# Patient Record
Sex: Female | Born: 1955 | Race: White | Hispanic: No | Marital: Married | Smoking: Never smoker
Health system: Southern US, Community
[De-identification: ages and names within clinical notes are randomized; demographics above are authoritative.]

## PROBLEM LIST (undated history)

## (undated) DIAGNOSIS — L02419 Cutaneous abscess of limb, unspecified: Secondary | ICD-10-CM

## (undated) DIAGNOSIS — IMO0002 Reserved for concepts with insufficient information to code with codable children: Secondary | ICD-10-CM

## (undated) DIAGNOSIS — K219 Gastro-esophageal reflux disease without esophagitis: Secondary | ICD-10-CM

## (undated) DIAGNOSIS — L0291 Cutaneous abscess, unspecified: Secondary | ICD-10-CM

## (undated) DIAGNOSIS — Z9889 Other specified postprocedural states: Secondary | ICD-10-CM

## (undated) DIAGNOSIS — Z853 Personal history of malignant neoplasm of breast: Secondary | ICD-10-CM

## (undated) DIAGNOSIS — R011 Cardiac murmur, unspecified: Secondary | ICD-10-CM

## (undated) DIAGNOSIS — E785 Hyperlipidemia, unspecified: Secondary | ICD-10-CM

## (undated) DIAGNOSIS — K2270 Barrett's esophagus without dysplasia: Secondary | ICD-10-CM

## (undated) DIAGNOSIS — M419 Scoliosis, unspecified: Secondary | ICD-10-CM

## (undated) DIAGNOSIS — IMO0001 Reserved for inherently not codable concepts without codable children: Secondary | ICD-10-CM

## (undated) DIAGNOSIS — K589 Irritable bowel syndrome without diarrhea: Secondary | ICD-10-CM

## (undated) DIAGNOSIS — L0390 Cellulitis, unspecified: Secondary | ICD-10-CM

## (undated) DIAGNOSIS — M810 Age-related osteoporosis without current pathological fracture: Secondary | ICD-10-CM

## (undated) DIAGNOSIS — Z780 Asymptomatic menopausal state: Secondary | ICD-10-CM

## (undated) DIAGNOSIS — K222 Esophageal obstruction: Secondary | ICD-10-CM

## (undated) DIAGNOSIS — H269 Unspecified cataract: Secondary | ICD-10-CM

## (undated) DIAGNOSIS — Z923 Personal history of irradiation: Secondary | ICD-10-CM

## (undated) DIAGNOSIS — B1920 Unspecified viral hepatitis C without hepatic coma: Secondary | ICD-10-CM

## (undated) DIAGNOSIS — G039 Meningitis, unspecified: Secondary | ICD-10-CM

## (undated) DIAGNOSIS — L678 Other hair color and hair shaft abnormalities: Secondary | ICD-10-CM

## (undated) DIAGNOSIS — R112 Nausea with vomiting, unspecified: Secondary | ICD-10-CM

## (undated) DIAGNOSIS — I341 Nonrheumatic mitral (valve) prolapse: Secondary | ICD-10-CM

## (undated) DIAGNOSIS — L738 Other specified follicular disorders: Secondary | ICD-10-CM

## (undated) DIAGNOSIS — K449 Diaphragmatic hernia without obstruction or gangrene: Secondary | ICD-10-CM

## (undated) DIAGNOSIS — G9050 Complex regional pain syndrome I, unspecified: Secondary | ICD-10-CM

## (undated) DIAGNOSIS — L719 Rosacea, unspecified: Secondary | ICD-10-CM

## (undated) DIAGNOSIS — Z5189 Encounter for other specified aftercare: Secondary | ICD-10-CM

## (undated) DIAGNOSIS — M1990 Unspecified osteoarthritis, unspecified site: Secondary | ICD-10-CM

## (undated) DIAGNOSIS — K5790 Diverticulosis of intestine, part unspecified, without perforation or abscess without bleeding: Secondary | ICD-10-CM

## (undated) DIAGNOSIS — K648 Other hemorrhoids: Secondary | ICD-10-CM

## (undated) DIAGNOSIS — G629 Polyneuropathy, unspecified: Secondary | ICD-10-CM

## (undated) DIAGNOSIS — C50412 Malignant neoplasm of upper-outer quadrant of left female breast: Secondary | ICD-10-CM

## (undated) HISTORY — DX: Diaphragmatic hernia without obstruction or gangrene: K44.9

## (undated) HISTORY — PX: BACK SURGERY: SHX140

## (undated) HISTORY — PX: UPPER GASTROINTESTINAL ENDOSCOPY: SHX188

## (undated) HISTORY — DX: Unspecified osteoarthritis, unspecified site: M19.90

## (undated) HISTORY — DX: Esophageal obstruction: K22.2

## (undated) HISTORY — DX: Asymptomatic menopausal state: Z78.0

## (undated) HISTORY — DX: Cardiac murmur, unspecified: R01.1

## (undated) HISTORY — PX: INCISE AND DRAIN ABCESS: PRO64

## (undated) HISTORY — PX: BREAST FIBROADENOMA SURGERY: SHX580

## (undated) HISTORY — DX: Hyperlipidemia, unspecified: E78.5

## (undated) HISTORY — DX: Nonrheumatic mitral (valve) prolapse: I34.1

## (undated) HISTORY — DX: Gastro-esophageal reflux disease without esophagitis: K21.9

## (undated) HISTORY — DX: Encounter for other specified aftercare: Z51.89

## (undated) HISTORY — DX: Diverticulosis of intestine, part unspecified, without perforation or abscess without bleeding: K57.90

## (undated) HISTORY — PX: SPINAL FUSION: SHX223

## (undated) HISTORY — DX: Irritable bowel syndrome, unspecified: K58.9

## (undated) HISTORY — PX: OTHER SURGICAL HISTORY: SHX169

## (undated) HISTORY — DX: Rosacea, unspecified: L71.9

## (undated) HISTORY — DX: Barrett's esophagus without dysplasia: K22.70

## (undated) HISTORY — DX: Unspecified cataract: H26.9

## (undated) HISTORY — DX: Scoliosis, unspecified: M41.9

## (undated) HISTORY — DX: Cutaneous abscess of limb, unspecified: L02.419

## (undated) HISTORY — DX: Other specified follicular disorders: L73.8

## (undated) HISTORY — DX: Age-related osteoporosis without current pathological fracture: M81.0

## (undated) HISTORY — PX: BREAST LUMPECTOMY: SHX2

## (undated) HISTORY — DX: Personal history of malignant neoplasm of breast: Z85.3

## (undated) HISTORY — DX: Complex regional pain syndrome I, unspecified: G90.50

## (undated) HISTORY — PX: HYSTEROSCOPY: SHX211

## (undated) HISTORY — DX: Unspecified viral hepatitis C without hepatic coma: B19.20

## (undated) HISTORY — DX: Malignant neoplasm of upper-outer quadrant of left female breast: C50.412

## (undated) HISTORY — DX: Other hemorrhoids: K64.8

## (undated) HISTORY — DX: Cutaneous abscess, unspecified: L02.91

## (undated) HISTORY — DX: Cellulitis, unspecified: L03.90

## (undated) HISTORY — PX: TONSILLECTOMY: SUR1361

## (undated) HISTORY — DX: Polyneuropathy, unspecified: G62.9

## (undated) HISTORY — DX: Other hair color and hair shaft abnormalities: L67.8

---

## 1988-07-29 DIAGNOSIS — B1920 Unspecified viral hepatitis C without hepatic coma: Secondary | ICD-10-CM

## 1988-07-29 HISTORY — DX: Unspecified viral hepatitis C without hepatic coma: B19.20

## 1998-07-17 ENCOUNTER — Other Ambulatory Visit: Admission: RE | Admit: 1998-07-17 | Discharge: 1998-07-17 | Payer: Self-pay | Admitting: Obstetrics and Gynecology

## 1998-10-05 ENCOUNTER — Encounter: Payer: Self-pay | Admitting: Internal Medicine

## 1998-10-05 ENCOUNTER — Ambulatory Visit (HOSPITAL_COMMUNITY): Admission: RE | Admit: 1998-10-05 | Discharge: 1998-10-05 | Payer: Self-pay | Admitting: Internal Medicine

## 1999-10-22 ENCOUNTER — Other Ambulatory Visit: Admission: RE | Admit: 1999-10-22 | Discharge: 1999-10-22 | Payer: Self-pay | Admitting: Obstetrics and Gynecology

## 2000-11-27 ENCOUNTER — Other Ambulatory Visit: Admission: RE | Admit: 2000-11-27 | Discharge: 2000-11-27 | Payer: Self-pay | Admitting: Obstetrics and Gynecology

## 2000-12-02 ENCOUNTER — Encounter: Payer: Self-pay | Admitting: Obstetrics and Gynecology

## 2000-12-02 ENCOUNTER — Encounter: Admission: RE | Admit: 2000-12-02 | Discharge: 2000-12-02 | Payer: Self-pay | Admitting: Obstetrics and Gynecology

## 2001-09-03 ENCOUNTER — Encounter: Payer: Self-pay | Admitting: Internal Medicine

## 2001-12-15 ENCOUNTER — Other Ambulatory Visit: Admission: RE | Admit: 2001-12-15 | Discharge: 2001-12-15 | Payer: Self-pay | Admitting: Obstetrics and Gynecology

## 2001-12-18 ENCOUNTER — Encounter: Admission: RE | Admit: 2001-12-18 | Discharge: 2001-12-18 | Payer: Self-pay | Admitting: Obstetrics and Gynecology

## 2001-12-18 ENCOUNTER — Encounter: Payer: Self-pay | Admitting: Obstetrics and Gynecology

## 2002-01-13 ENCOUNTER — Ambulatory Visit (HOSPITAL_COMMUNITY): Admission: RE | Admit: 2002-01-13 | Discharge: 2002-01-13 | Payer: Self-pay | Admitting: Psychiatry

## 2002-01-13 ENCOUNTER — Encounter: Payer: Self-pay | Admitting: Neurology

## 2002-09-02 ENCOUNTER — Ambulatory Visit (HOSPITAL_COMMUNITY): Admission: RE | Admit: 2002-09-02 | Discharge: 2002-09-02 | Payer: Self-pay | Admitting: Internal Medicine

## 2002-09-02 ENCOUNTER — Encounter: Payer: Self-pay | Admitting: Internal Medicine

## 2002-12-08 ENCOUNTER — Ambulatory Visit (HOSPITAL_COMMUNITY): Admission: RE | Admit: 2002-12-08 | Discharge: 2002-12-08 | Payer: Self-pay | Admitting: Obstetrics and Gynecology

## 2002-12-08 ENCOUNTER — Encounter (INDEPENDENT_AMBULATORY_CARE_PROVIDER_SITE_OTHER): Payer: Self-pay

## 2002-12-20 ENCOUNTER — Encounter: Admission: RE | Admit: 2002-12-20 | Discharge: 2002-12-20 | Payer: Self-pay | Admitting: Obstetrics and Gynecology

## 2002-12-20 ENCOUNTER — Other Ambulatory Visit: Admission: RE | Admit: 2002-12-20 | Discharge: 2002-12-20 | Payer: Self-pay | Admitting: Obstetrics and Gynecology

## 2002-12-20 ENCOUNTER — Encounter: Payer: Self-pay | Admitting: Obstetrics and Gynecology

## 2003-04-05 ENCOUNTER — Ambulatory Visit (HOSPITAL_COMMUNITY): Admission: RE | Admit: 2003-04-05 | Discharge: 2003-04-05 | Payer: Self-pay | Admitting: Internal Medicine

## 2003-04-05 ENCOUNTER — Encounter: Payer: Self-pay | Admitting: Internal Medicine

## 2003-10-06 ENCOUNTER — Ambulatory Visit (HOSPITAL_COMMUNITY): Admission: RE | Admit: 2003-10-06 | Discharge: 2003-10-06 | Payer: Self-pay | Admitting: Internal Medicine

## 2003-10-06 ENCOUNTER — Encounter: Payer: Self-pay | Admitting: Internal Medicine

## 2003-12-27 ENCOUNTER — Other Ambulatory Visit: Admission: RE | Admit: 2003-12-27 | Discharge: 2003-12-27 | Payer: Self-pay | Admitting: Obstetrics and Gynecology

## 2003-12-27 ENCOUNTER — Encounter: Admission: RE | Admit: 2003-12-27 | Discharge: 2003-12-27 | Payer: Self-pay | Admitting: Obstetrics and Gynecology

## 2005-01-31 ENCOUNTER — Ambulatory Visit: Payer: Self-pay | Admitting: Internal Medicine

## 2005-07-18 ENCOUNTER — Ambulatory Visit (HOSPITAL_COMMUNITY): Admission: RE | Admit: 2005-07-18 | Discharge: 2005-07-18 | Payer: Self-pay | Admitting: Internal Medicine

## 2005-09-23 ENCOUNTER — Encounter: Admission: RE | Admit: 2005-09-23 | Discharge: 2005-09-23 | Payer: Self-pay | Admitting: Obstetrics and Gynecology

## 2005-11-12 ENCOUNTER — Ambulatory Visit: Payer: Self-pay | Admitting: Internal Medicine

## 2006-05-05 ENCOUNTER — Ambulatory Visit: Payer: Self-pay | Admitting: Internal Medicine

## 2006-05-06 ENCOUNTER — Ambulatory Visit: Payer: Self-pay | Admitting: Internal Medicine

## 2006-05-06 ENCOUNTER — Encounter (INDEPENDENT_AMBULATORY_CARE_PROVIDER_SITE_OTHER): Payer: Self-pay | Admitting: *Deleted

## 2006-06-18 ENCOUNTER — Ambulatory Visit (HOSPITAL_COMMUNITY): Admission: RE | Admit: 2006-06-18 | Discharge: 2006-06-18 | Payer: Self-pay | Admitting: Cardiovascular Disease

## 2006-11-26 ENCOUNTER — Encounter
Admission: RE | Admit: 2006-11-26 | Discharge: 2006-11-26 | Payer: Self-pay | Admitting: Physical Medicine and Rehabilitation

## 2007-02-03 ENCOUNTER — Ambulatory Visit: Payer: Self-pay | Admitting: Cardiovascular Disease

## 2007-02-04 ENCOUNTER — Ambulatory Visit: Payer: Self-pay | Admitting: Cardiology

## 2007-02-04 ENCOUNTER — Ambulatory Visit (HOSPITAL_COMMUNITY): Admission: RE | Admit: 2007-02-04 | Discharge: 2007-02-04 | Payer: Self-pay | Admitting: Cardiovascular Disease

## 2007-04-20 ENCOUNTER — Ambulatory Visit: Payer: Self-pay | Admitting: Internal Medicine

## 2007-05-04 ENCOUNTER — Ambulatory Visit: Payer: Self-pay | Admitting: Internal Medicine

## 2007-05-04 ENCOUNTER — Encounter: Payer: Self-pay | Admitting: Internal Medicine

## 2007-06-22 ENCOUNTER — Ambulatory Visit (HOSPITAL_COMMUNITY): Admission: RE | Admit: 2007-06-22 | Discharge: 2007-06-22 | Payer: Self-pay | Admitting: Obstetrics and Gynecology

## 2007-07-30 HISTORY — PX: COLONOSCOPY: SHX174

## 2007-12-14 ENCOUNTER — Telehealth: Payer: Self-pay | Admitting: Internal Medicine

## 2008-01-26 DIAGNOSIS — G589 Mononeuropathy, unspecified: Secondary | ICD-10-CM | POA: Insufficient documentation

## 2008-01-26 DIAGNOSIS — M4120 Other idiopathic scoliosis, site unspecified: Secondary | ICD-10-CM | POA: Insufficient documentation

## 2008-01-26 DIAGNOSIS — K222 Esophageal obstruction: Secondary | ICD-10-CM

## 2008-01-26 DIAGNOSIS — K648 Other hemorrhoids: Secondary | ICD-10-CM | POA: Insufficient documentation

## 2008-01-26 DIAGNOSIS — K589 Irritable bowel syndrome without diarrhea: Secondary | ICD-10-CM

## 2008-01-26 DIAGNOSIS — K219 Gastro-esophageal reflux disease without esophagitis: Secondary | ICD-10-CM

## 2008-01-26 DIAGNOSIS — Z8619 Personal history of other infectious and parasitic diseases: Secondary | ICD-10-CM

## 2008-01-26 DIAGNOSIS — Z8679 Personal history of other diseases of the circulatory system: Secondary | ICD-10-CM | POA: Insufficient documentation

## 2008-01-26 DIAGNOSIS — E785 Hyperlipidemia, unspecified: Secondary | ICD-10-CM | POA: Insufficient documentation

## 2008-01-26 DIAGNOSIS — G9050 Complex regional pain syndrome I, unspecified: Secondary | ICD-10-CM | POA: Insufficient documentation

## 2008-01-26 DIAGNOSIS — K5730 Diverticulosis of large intestine without perforation or abscess without bleeding: Secondary | ICD-10-CM | POA: Insufficient documentation

## 2008-01-26 DIAGNOSIS — K2270 Barrett's esophagus without dysplasia: Secondary | ICD-10-CM

## 2008-01-27 ENCOUNTER — Ambulatory Visit: Payer: Self-pay | Admitting: Internal Medicine

## 2008-01-28 ENCOUNTER — Ambulatory Visit: Payer: Self-pay | Admitting: Internal Medicine

## 2008-02-01 LAB — CONVERTED CEMR LAB
ALT: 25 units/L (ref 0–35)
AST: 22 units/L (ref 0–37)
Alkaline Phosphatase: 57 units/L (ref 39–117)
Basophils Absolute: 0.1 10*3/uL (ref 0.0–0.1)
Bilirubin, Direct: 0.1 mg/dL (ref 0.0–0.3)
Iron: 69 ug/dL (ref 42–145)
Lymphocytes Relative: 29.7 % (ref 12.0–46.0)
MCHC: 33.6 g/dL (ref 30.0–36.0)
Monocytes Relative: 7.4 % (ref 3.0–12.0)
Neutrophils Relative %: 58.7 % (ref 43.0–77.0)
Platelets: 214 10*3/uL (ref 150–400)
RDW: 12.6 % (ref 11.5–14.6)
Total Bilirubin: 0.7 mg/dL (ref 0.3–1.2)

## 2008-02-03 LAB — CONVERTED CEMR LAB

## 2008-02-09 ENCOUNTER — Telehealth: Payer: Self-pay | Admitting: Internal Medicine

## 2009-02-06 ENCOUNTER — Telehealth: Payer: Self-pay | Admitting: Internal Medicine

## 2009-02-07 ENCOUNTER — Ambulatory Visit: Payer: Self-pay | Admitting: Internal Medicine

## 2009-02-07 LAB — CONVERTED CEMR LAB

## 2009-02-08 ENCOUNTER — Ambulatory Visit (HOSPITAL_COMMUNITY): Admission: RE | Admit: 2009-02-08 | Discharge: 2009-02-08 | Payer: Self-pay | Admitting: Obstetrics and Gynecology

## 2009-02-08 LAB — CONVERTED CEMR LAB
AST: 21 units/L (ref 0–37)
Alkaline Phosphatase: 67 units/L (ref 39–117)
Basophils Relative: 0.8 % (ref 0.0–3.0)
Bilirubin, Direct: 0.1 mg/dL (ref 0.0–0.3)
CO2: 34 meq/L — ABNORMAL HIGH (ref 19–32)
Calcium: 9.9 mg/dL (ref 8.4–10.5)
Chloride: 102 meq/L (ref 96–112)
Eosinophils Absolute: 0.2 10*3/uL (ref 0.0–0.7)
Eosinophils Relative: 3.1 % (ref 0.0–5.0)
Glucose, Bld: 92 mg/dL (ref 70–99)
HCT: 43 % (ref 36.0–46.0)
Lymphs Abs: 1.3 10*3/uL (ref 0.7–4.0)
MCHC: 34.8 g/dL (ref 30.0–36.0)
MCV: 86.2 fL (ref 78.0–100.0)
Monocytes Absolute: 0.3 10*3/uL (ref 0.1–1.0)
Platelets: 226 10*3/uL (ref 150.0–400.0)
Potassium: 4.8 meq/L (ref 3.5–5.1)
Sodium: 142 meq/L (ref 135–145)
Total Protein: 7.2 g/dL (ref 6.0–8.3)
WBC: 5.6 10*3/uL (ref 4.5–10.5)

## 2009-02-09 ENCOUNTER — Encounter: Payer: Self-pay | Admitting: Internal Medicine

## 2009-02-09 ENCOUNTER — Telehealth: Payer: Self-pay | Admitting: Internal Medicine

## 2009-04-10 ENCOUNTER — Encounter (INDEPENDENT_AMBULATORY_CARE_PROVIDER_SITE_OTHER): Payer: Self-pay | Admitting: *Deleted

## 2009-05-08 ENCOUNTER — Ambulatory Visit: Payer: Self-pay | Admitting: Internal Medicine

## 2009-05-17 ENCOUNTER — Ambulatory Visit: Payer: Self-pay | Admitting: Internal Medicine

## 2009-05-17 ENCOUNTER — Encounter: Payer: Self-pay | Admitting: Internal Medicine

## 2009-05-17 LAB — CONVERTED CEMR LAB
Basophils Absolute: 0 10*3/uL (ref 0.0–0.1)
Eosinophils Absolute: 0.2 10*3/uL (ref 0.0–0.7)
HCT: 42.8 % (ref 36.0–46.0)
Hemoglobin: 14.5 g/dL (ref 12.0–15.0)
Lymphs Abs: 1.4 10*3/uL (ref 0.7–4.0)
MCHC: 33.8 g/dL (ref 30.0–36.0)
Monocytes Absolute: 0.3 10*3/uL (ref 0.1–1.0)
Neutro Abs: 3.5 10*3/uL (ref 1.4–7.7)
RDW: 13 % (ref 11.5–14.6)

## 2009-05-26 ENCOUNTER — Encounter: Payer: Self-pay | Admitting: Internal Medicine

## 2009-12-07 ENCOUNTER — Ambulatory Visit (HOSPITAL_COMMUNITY): Admission: RE | Admit: 2009-12-07 | Discharge: 2009-12-07 | Payer: Self-pay | Admitting: Urology

## 2010-03-01 ENCOUNTER — Ambulatory Visit (HOSPITAL_COMMUNITY): Admission: RE | Admit: 2010-03-01 | Discharge: 2010-03-01 | Payer: Self-pay | Admitting: Obstetrics and Gynecology

## 2010-07-11 ENCOUNTER — Telehealth: Payer: Self-pay | Admitting: Internal Medicine

## 2010-07-13 ENCOUNTER — Ambulatory Visit: Payer: Self-pay | Admitting: Internal Medicine

## 2010-07-16 ENCOUNTER — Encounter: Payer: Self-pay | Admitting: Internal Medicine

## 2010-07-18 LAB — CONVERTED CEMR LAB
Alkaline Phosphatase: 71 units/L (ref 39–117)
Basophils Absolute: 0.1 10*3/uL (ref 0.0–0.1)
Eosinophils Relative: 1.5 % (ref 0.0–5.0)
Glucose, Bld: 78 mg/dL (ref 70–99)
HCT: 43 % (ref 36.0–46.0)
HDL: 58.6 mg/dL (ref >39.00)
Lymphs Abs: 1.5 10*3/uL (ref 0.7–4.0)
MCHC: 33.9 g/dL (ref 30.0–36.0)
MCV: 88.9 fL (ref 78.0–100.0)
Monocytes Absolute: 0.5 10*3/uL (ref 0.1–1.0)
Platelets: 249 10*3/uL (ref 150.0–400.0)
RDW: 13.7 % (ref 11.5–14.6)
Sodium: 142 meq/L (ref 135–145)
TSH: 1.43 microintl units/mL (ref 0.35–5.50)
Total Bilirubin: 0.6 mg/dL (ref 0.3–1.2)
Total Protein: 7 g/dL (ref 6.0–8.3)
VLDL: 13.6 mg/dL (ref 0.0–40.0)
Vitamin B-12: 430 pg/mL (ref 211–911)

## 2010-07-24 LAB — CONVERTED CEMR LAB

## 2010-08-18 ENCOUNTER — Encounter: Payer: Self-pay | Admitting: Otolaryngology

## 2010-08-19 ENCOUNTER — Encounter: Payer: Self-pay | Admitting: Obstetrics and Gynecology

## 2010-08-30 NOTE — Progress Notes (Signed)
Summary: labs  Phone Note Call from Patient Call back at Home Phone [phone] Call back at [phone]   Caller: Patient Call For: Dr. Juanda Chance Reason for Call: Talk to Nurse Summary of Call: would like to sch labs Initial call taken by: Vallarie Mare,  July 11, 2010 1:41 PM  Follow-up for Phone Call        Message left for patient to call back.Jesse Fall RN  July 11, 2010 2:24 PM Message left for patient to call back.Jesse Fall RN  July 12, 2010 10:39 AM Message left for patient to call back.Jesse Fall RN  July 13, 2010 9:31 AM Patient returned our call. She states she usually has yearly labs to check her liver and she has not had labs this year. She takes Neurontin, Omprazole and Ibuprofen. She would like to have the labs before the end of the year if she needs them. May call patient with the answer at her husbands cell [phone]. Please, advise. Follow-up by: Jesse Fall RN,  July 13, 2010 9:43 AM  Additional Follow-up for Phone Call Additional follow up Details #1::        CBC,C-met, TSH, Hep C RNA by PCR,, lipid panel,B12,Fe,TIBC Additional Follow-up by: Hart Carwin MD,  July 13, 2010 12:47 PM     Appended Document: labs Labs added in IDX. Patient states she will come next week for labs.

## 2010-12-11 NOTE — Procedures (Signed)
NAMEALIVIYAH, MALANGA             ACCOUNT NO.:  [id number]   MEDICAL RECORD NO.:  [id number]          PATIENT TYPE:  OUT   LOCATION:  RAD                           FACILITY:  [facility]   PHYSICIAN:  Gerrit Friends. Dietrich Pates, MD, FACCDATE OF BIRTH:  1956-06-04   DATE OF PROCEDURE:  02/04/2007  DATE OF DISCHARGE:                                ECHOCARDIOGRAM   REFERRING:  Kingsley Callander. Ouida Sills, MD   CLINICAL DATA:  A 55 year old woman with a history of MVP.   M-mode aorta 2.6, left atrium 3.4, septum 0.8, posterior wall 0.8, LV  diastole 4.6, LV systole 3.5, RV diastole 3.9.   1. Technically adequate echocardiographic study.  2. Normal left atrial size.  3. Mild right atrial and right ventricular enlargement.  4. Normal proximal ascending aorta.  5. Normal aortic, mitral, tricuspid and pulmonic valves; physiologic      tricuspid regurgitation; normal estimated RV systolic pressure.  6. Normal proximal pulmonary artery.  7. Normal internal dimension, wall thickness, regional and global      function of the left ventricle.  8. Normal Doppler examination.  9. Normal IVC.      Gerrit Friends. Dietrich Pates, MD, [facility]  Electronically Signed     RMR/MEDQ  D:  02/04/2007  T:  02/05/2007  Job:  [id number]

## 2010-12-11 NOTE — Assessment & Plan Note (Signed)
[facility]                        CARDIOLOGY OFFICE NOTE   Terrell, Amanda                    MRN:          [id number]  DATE:02/03/2007                            DOB:          12-11-1955    [name] is a pleasant 55 year old patient, referred by Dr. Ouida Sills for  followup of mitral valve disease.  [name] brought with her multiple  x-rays and approximately 100 pages of old records from [facility] and [facility] as  well as pain clinics and previous records from [place].   After looking through all these, unfortunately they mostly have to do  with her debilitating scoliosis and previous spine surgeries.   The patient has had a horrendous time with her scoliosis.  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  After having read through all  of this however I asked Nayda why she needed to see a heart doctor.   Apparently there is a distant history of mitral valve prolapse.  She has  seen Dr. Daleen Squibb in 2001 I believe.  At that time she had a 2D  echocardiogram which showed mild prolapse with trivial MR.  The patient  has had benign palpitations in the past.  In regards to her palpitations  they are infrequent.  They tend to be skips and not rapid heartbeats.  There is no associated chest pain, presyncope, or shortness of breath.   The palpitations really have not been bad over the last year, they were  much worse back in 2001.   She has never had a Holter monitor.  She did have a stress test at that  time which was normal.   REVIEW OF SYSTEMS:  Otherwise negative.   PAST MEDICAL HISTORY:  Remarkable for her numerous spinal surgeries and  reflex sympathetic dystrophy.  She is a chronic pain clinic in [facility].   There is  also a question of hepatitis C that she follows up with Dr.  Lina Sar for.  She says this has been fine.   Her cardiac risk factors primarily include question of  hypercholesterolemia, no recent cholesterol check and not on  medications.  She is a nonsmoker.  No family history for coronary  disease.  No diabetes, no hypertension.   Her only medications include:  1. Lyrica 150 t.i.d.  2. Motrin 200 t.i.d.   SHE HAS NO KNOWN ALLERGIES, ALTHOUGH SHE IS INTOLERANT TO CLONIDINE.  APPARENTLY THIS WAS TRIED TO HELP WITH HER PAIN IN HER LEG BUT IT CAUSED  HER EXCESSIVE LOW BLOOD PRESSURE.   Has indicated family history is noncontributory.   She is happily married, she has 3 grown children.  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   She has joined a Y, but has difficulty ambulating because of a bit of a  foot drop in the right ankle.   She does not drink or smoke.   EXAMINATION:  Remarkable for an actual healthy-appearing, middle-aged  white female in no distress.  She is afebrile, weight is 157, blood  pressure is 118/88, pulse 80 and regular, respiratory rate is 14.  HEENT:  Normal, there is no carotid bruits, no lymphadenopathy, no  thyromegaly.  LUNGS:  Clear with reasonable diaphragmatic motion.  She has significant scoliosis with previous lumbar surgery.  There is an  S1-S2 with normal heart sounds, I do not hear a click or an MR murmur.  PMI is normal.  ABDOMEN:  Benign, bowel sounds positive, no organomegaly and no  tenderness, no hepatosplenomegaly, hepatojugular reflux.  Distal pulses are intact.  There is trace edema in the right lower  extremity.  She has mildly decreased abduction at the ankle, indicating  some motor nerve problems there.  She has some sensory deficits in the  soles of both feet.  NEUROLOGICAL:  Exam is otherwise nonfocal.  There is no obvious muscular  weakness.   Her baseline EKG is normal.   IMPRESSION:  1.  Benign palpitations, I do not think there is any need for further      workup.  There is no need for monitoring.  She will continue pain      management as indicated.  I would not give her a beta blocker given      her blood pressure response to Clonidine and tendency to run low.  2. History of mitral valve prolapse, patient is concerned about this.      I do not hear a significant murmur.  I think it is reasonable to do      a followup echo to rule out any significant mitral valve disease.      She does not need subacute bacterial endocarditis prophylaxis.  3. History of lumbar scoliosis with reflex sympathetic dystrophy,      followup with her primary pain clinic in [place] and continue      Lyrica and Motrin.   Patient will be seen on a p.r.n. basis, so long as her echo is not bad.     Noralyn Pick. Eden Emms, MD, [facility]  Electronically Signed    PCN/MedQ  DD: 02/03/2007  DT: 02/03/2007  Job #: [id number]

## 2010-12-14 NOTE — Assessment & Plan Note (Signed)
[facility]                           GASTROENTEROLOGY OFFICE NOTE   JACOYA, BAUMAN                    MRN:          [id number]  DATE:05/05/2006                            DOB:          1955/10/17   Ms. Gully is a 56 year old white female with a history of hepatitis C,  status post alfa-1 interferon treatment, finished in 2001, severe scoliosis,  status post a repair in the 1980s, history of herpes zoster in 2001, liver  biopsy in March 2002 showing hepatitis activity level 2 fibrosis 1.  Last  colonoscopy in February of 2003 for neoplastic screening showed  diverticulosis and internal hemorrhoids.  She had one episode of rectal  bleeding.  She is currently taking up to 8 Motrin a day for chronic low back  pain and right foot drop, together with Neurontin 300 mg four times a day.  She has experienced epigastric discomfort and some nausea.  Last ultrasound  of the upper abdomen early this year showed normal gallbladder and common  bile duct.  Both of her parents had their gallbladder removed.   PHYSICAL EXAMINATION:  Blood pressure 118/76, pulse 72 and weight 148  pounds.  She was alert, oriented, in no distress.  LUNGS:  Clear to auscultation.  COR:  Normal S1, normal S2.  ABDOMEN:  Soft with well-healed surgical scars, normoactive bowel sounds,  tenderness above the umbilicus and in the midline.  Liver edge at the costal  margin.  RECTAL:  Exam with Hemoccult-negative stool.  EXTREMITIES:  No edema.  Right foot was cold with decreased pulses.   IMPRESSION:  30. 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  2. Symptomatic hemorrhoids, currently under good control with Proctocort.  3. Irritable bowel syndrome by history.  4. Hepatitis C, last viral load early this year was negative by PCR.   PLAN:  1. Protonix 40 mg p.o. daily.  2. Refill for Ultram.  3. Decrease Motrin as much  as possible.  4. Upper endoscopy scheduled for May 06, 2006.      Hedwig Morton. Juanda Chance, MD   DMB/MedQ  DD:  05/05/2006  DT:  05/07/2006  Job #:  [id number]   cc:   Kingsley Callander. Ouida Sills, MD

## 2010-12-14 NOTE — Op Note (Signed)
NAME:  Amanda Terrell, Amanda Terrell                       ACCOUNT NO.:  [id number]   MEDICAL RECORD NO.:  [id number]                   PATIENT TYPE:  AMB   LOCATION:  SDC                                  FACILITY:  [facility]   PHYSICIAN:  Sherry A. Rosalio Macadamia, M.D.           DATE OF BIRTH:  August 14, 1955   DATE OF PROCEDURE:  12/08/2002  DATE OF DISCHARGE:                                 OPERATIVE REPORT   PREOPERATIVE DIAGNOSIS:  Menorrhagia, submucosal fibroid.   POSTOPERATIVE DIAGNOSIS:  Menorrhagia, submucosal fibroid.   PROCEDURE:  D & C, hysteroscopy with resectoscope.   SURGEON:  Sherry A. Rosalio Macadamia, M.D.   ANESTHESIA:  MAC.   INDICATIONS:  This is a 55 year old G4, P3, 0-1-3, woman, who has had  menstrual periods every month, lasting 7-8 days with excessively heavy flow.  The patient's flow has increased recently, despite being on birth control  pills.  Ultrasound was performed, which revealed a fibroid uterus with  probable submucosal fibroid on the anterior wall.  Because of this, the  patient was brought to the operating room for D & C, hysteroscopy, and  resectoscope.   FINDINGS:  10-week size, anteflexed uterus, irregular, no adnexal mass,  irregular tissue in the endometrial cavity, and indentation of the  endometrial cavity from an anterior submucosal fibroid.   PROCEDURE:  The patient was brought into the operating room.  She was placed  in the dorsolithotomy position.  Adequate IV sedation was administered.  She  was washed with Hibiclens.  The patient was draped in a sterile fashion.  Pelvic examination was performed.  A speculum was placed in the vagina.  The  vagina was washed with Hibiclens. Paracervical block was administered with  1% ______.  The anterior lip of the cervix was grasped with a single-tooth  tenaculum.  The cervix was sounded.  The cervix was dilated with Pratt  dilators to a #31.  The hysteroscope was introduced into the endometrial  cavity.  Pictures were  obtained.  Using a double-loop right angle resector,  endometrial tissue was sampled circumferentially.  The anterior wall of the  endometrial cavity was sampled.  A submucosal fibroid was present.  This  fibroid was not completely within the endometrial cavity.  The bleeders were  cauterized, and endometrial tissue that remained was also cauterized.  Adequate hemostasis was present.  All instruments were removed from the  vagina.  The patient was taken out of the dorsolithotomy position.  She was  awakened.  She was moved from the operating table to the stretcher in stable  condition.   COMPLICATIONS:  None.   ESTIMATED BLOOD LOSS:  Less than 5 mL.   Sorbitol differential was a -70 mL.  Sherry A. Rosalio Macadamia, M.D.   SAD/MEDQ  D:  12/08/2002  T:  12/09/2002  Job:  [id number]

## 2011-05-28 ENCOUNTER — Telehealth: Payer: Self-pay | Admitting: Internal Medicine

## 2011-05-28 ENCOUNTER — Other Ambulatory Visit (HOSPITAL_COMMUNITY): Payer: Self-pay | Admitting: Internal Medicine

## 2011-05-28 DIAGNOSIS — Z139 Encounter for screening, unspecified: Secondary | ICD-10-CM

## 2011-05-28 DIAGNOSIS — B1920 Unspecified viral hepatitis C without hepatic coma: Secondary | ICD-10-CM

## 2011-05-28 NOTE — Telephone Encounter (Signed)
Patient calling to verify her recall for NFA(2130) and to see when she needs to come in for her yearly labs for "liver". Last labs on 07/16/10 - CBC, CMET. TSH, Hep C RNA by PCP , Lipid Panel, B12, Fe, TIBC)  Please , advise.

## 2011-05-28 NOTE — Telephone Encounter (Signed)
Spoke with patient and she will get labs the week of 07/01/11.

## 2011-05-28 NOTE — Telephone Encounter (Signed)
Yes, please repeat the same labs in Dec 2012, EGD in 2013, she did not have Barrett's on last EGD

## 2011-05-30 ENCOUNTER — Other Ambulatory Visit (HOSPITAL_COMMUNITY): Payer: Self-pay | Admitting: Obstetrics & Gynecology

## 2011-05-30 ENCOUNTER — Ambulatory Visit (HOSPITAL_COMMUNITY)
Admission: RE | Admit: 2011-05-30 | Discharge: 2011-05-30 | Disposition: A | Discharge status: Home / Self Care | Payer: BC Managed Care – PPO | Source: Ambulatory Visit | Attending: Internal Medicine | Admitting: Internal Medicine

## 2011-05-30 DIAGNOSIS — Z1231 Encounter for screening mammogram for malignant neoplasm of breast: Secondary | ICD-10-CM | POA: Insufficient documentation

## 2011-05-30 DIAGNOSIS — Z139 Encounter for screening, unspecified: Secondary | ICD-10-CM

## 2011-06-27 ENCOUNTER — Encounter (INDEPENDENT_AMBULATORY_CARE_PROVIDER_SITE_OTHER): Payer: Self-pay | Admitting: Surgery

## 2011-06-27 ENCOUNTER — Ambulatory Visit (INDEPENDENT_AMBULATORY_CARE_PROVIDER_SITE_OTHER): Payer: BC Managed Care – PPO | Admitting: Surgery

## 2011-06-27 VITALS — BP 128/80 | HR 70 | Temp 96.9°F | Resp 16 | Ht 64.0 in | Wt 148.0 lb

## 2011-06-27 DIAGNOSIS — IMO0002 Reserved for concepts with insufficient information to code with codable children: Secondary | ICD-10-CM

## 2011-06-27 DIAGNOSIS — L02412 Cutaneous abscess of left axilla: Secondary | ICD-10-CM | POA: Insufficient documentation

## 2011-06-27 NOTE — Progress Notes (Signed)
Subjective:     Patient ID: Amanda Terrell, female   DOB: [birth year], 55 y.o.   MRN: [id number]  HPI This patient is referred by Dr. Terri Piedra for evaluation of a left axillary abscess. She has had some incision and drainage performed on this recently. She has also been on Bactrim as the abscess was MRSA positive. She is here for further evaluation. She reports that it started as a hard knot underneath the skin which he mashed. She is otherwise healthy with no complaints.  Review of Systems     Objective:   Physical Exam On examination, she has 2 separate areas of fluctuance in the left axilla. After obtaining consent, I anesthetized these areas with lidocaine. I performed 2 separate incisions and entered a large abscess cavity. She had [place] of drainage, this today and there is still some fluid there. There was a large amount of induration as well. I then packed both wounds separately with quarter-inch gauze.    Assessment:     Patient with left axillary abscess.    Plan:     Wound care instructions were given. She will pack the wound daily with gauze and wash with soap and water. I reviewed the Bactrim and broke her for hydrocodone as well. A timeout next week I will have her come back and see someone in the urgent office.

## 2011-07-02 ENCOUNTER — Ambulatory Visit (INDEPENDENT_AMBULATORY_CARE_PROVIDER_SITE_OTHER): Payer: BC Managed Care – PPO | Admitting: General Surgery

## 2011-07-02 ENCOUNTER — Encounter (INDEPENDENT_AMBULATORY_CARE_PROVIDER_SITE_OTHER): Payer: Self-pay | Admitting: General Surgery

## 2011-07-02 VITALS — BP 122/76 | HR 68 | Temp 97.4°F | Resp 16 | Ht 65.0 in | Wt 150.6 lb

## 2011-07-02 DIAGNOSIS — IMO0002 Reserved for concepts with insufficient information to code with codable children: Secondary | ICD-10-CM

## 2011-07-02 DIAGNOSIS — L02412 Cutaneous abscess of left axilla: Secondary | ICD-10-CM

## 2011-07-02 NOTE — Patient Instructions (Signed)
Your a left axillary abscess is much smaller and the infection is coming under control. Continue to shower each evening and repackthe wound as you are doing. Continue the antibiotics until they are all gone. We will try to get you back in to see Dr. Magnus Ivan in 2 weeks.

## 2011-07-02 NOTE — Progress Notes (Signed)
Subjective:     Patient ID: Amanda Terrell, female   DOB: Oct 14, 1955, 55 y.o.   MRN: [id number]  HPI This nice lady underwent incision and drainage of left axillary abscess x2 by Dr. Rayburn Ma on November 29. She is on Bactrim. Her husband is repacking the wounds once a day in the evening. It is getting better and the wounds are  much smaller than they were.  Review of Systems     Objective:   Physical Exam Patient is in no distress very friendly. Her husband is with her.  Left axillary wound is inspected. There are 2 small openings packed with 1/4 inch iodoform gauze. There is no cellulitis. There is no necrosis. There is no purulence. There is no odor. I repacked the wound and redressed this.    Assessment:     Left axillary abscesses. Infection is resolving with wound care and antibiotics.    Plan:     Continue Bactrim and told a prescription is completely used. Continue daily wound packing and showers. Return to see Dr. Magnus Ivan in approximately 2 weeks.

## 2011-07-08 ENCOUNTER — Telehealth: Payer: Self-pay | Admitting: *Deleted

## 2011-07-08 DIAGNOSIS — K759 Inflammatory liver disease, unspecified: Secondary | ICD-10-CM

## 2011-07-08 NOTE — Telephone Encounter (Signed)
Message copied by Daphine Deutscher on Mon Jul 08, 2011 10:01 AM ------      Message from: Daphine Deutscher      Created: Tue May 28, 2011  4:17 PM       Did patient get labs for DB 07/01/11

## 2011-07-08 NOTE — Telephone Encounter (Signed)
OK to add fasting  lipid profile to her Labs

## 2011-07-08 NOTE — Telephone Encounter (Signed)
Spoke with patient and she has not gotten labs done yet. She had an abscess in arm pit and was put on Sulfa. She is planning on coming for labs this week. She would like to add chloesterol level to labs if this is okay with Dr. Juanda Chance. Please, advise.

## 2011-07-09 NOTE — Telephone Encounter (Signed)
Lipid profile added to labs as per Dr. Juanda Chance. Patient aware.

## 2011-07-10 ENCOUNTER — Ambulatory Visit: Payer: BC Managed Care – PPO

## 2011-07-10 ENCOUNTER — Telehealth: Payer: Self-pay | Admitting: *Deleted

## 2011-07-10 ENCOUNTER — Encounter (INDEPENDENT_AMBULATORY_CARE_PROVIDER_SITE_OTHER): Payer: Self-pay | Admitting: Surgery

## 2011-07-10 DIAGNOSIS — K759 Inflammatory liver disease, unspecified: Secondary | ICD-10-CM

## 2011-07-10 DIAGNOSIS — B1920 Unspecified viral hepatitis C without hepatic coma: Secondary | ICD-10-CM

## 2011-07-10 LAB — IBC PANEL
Iron: 55 ug/dL (ref 42–145)
Transferrin: 262.1 mg/dL (ref 212.0–360.0)

## 2011-07-10 LAB — CBC WITH DIFFERENTIAL/PLATELET
Basophils Absolute: 0.1 10*3/uL (ref 0.0–0.1)
Basophils Relative: 1.7 % (ref 0.0–3.0)
Eosinophils Relative: 5.4 % — ABNORMAL HIGH (ref 0.0–5.0)
HCT: 42.9 % (ref 36.0–46.0)
Hemoglobin: 14.3 g/dL (ref 12.0–15.0)
Lymphocytes Relative: 27.5 % (ref 12.0–46.0)
Lymphs Abs: 1.1 10*3/uL (ref 0.7–4.0)
Monocytes Relative: 7.8 % (ref 3.0–12.0)
Neutro Abs: 2.3 10*3/uL (ref 1.4–7.7)
RBC: 4.86 Mil/uL (ref 3.87–5.11)
RDW: 13.4 % (ref 11.5–14.6)

## 2011-07-10 LAB — LIPID PANEL
Cholesterol: 162 mg/dL (ref 0–200)
HDL: 57.7 mg/dL (ref >39.00)
Total CHOL/HDL Ratio: 3
Triglycerides: 39 mg/dL (ref 0.0–149.0)

## 2011-07-10 LAB — COMPREHENSIVE METABOLIC PANEL
ALT: 26 U/L (ref 0–35)
BUN: 14 mg/dL (ref 6–23)
CO2: 30 mEq/L (ref 19–32)
Calcium: 9.2 mg/dL (ref 8.4–10.5)
Chloride: 101 mEq/L (ref 96–112)
Creatinine, Ser: 0.9 mg/dL (ref 0.4–1.2)
GFR: 68.55 mL/min (ref >60.00)
Glucose, Bld: 86 mg/dL (ref 70–99)
Total Bilirubin: 0.4 mg/dL (ref 0.3–1.2)

## 2011-07-10 LAB — TSH: TSH: 1.39 u[IU]/mL (ref 0.35–5.50)

## 2011-07-10 NOTE — Telephone Encounter (Signed)
Message copied by Daphine Deutscher on Wed Jul 10, 2011  2:09 PM ------      Message from: Hart Carwin      Created: Wed Jul 10, 2011  1:37 PM       Please call pt with normal results, if she wants, we can mail it to her.

## 2011-07-10 NOTE — Telephone Encounter (Signed)
Left patient a message with results and mailed her a copy of labs.

## 2011-07-11 LAB — HEPATITIS C VRS RNA DETECT BY PCR-QUAL

## 2011-07-12 ENCOUNTER — Telehealth: Payer: Self-pay | Admitting: *Deleted

## 2011-07-12 NOTE — Telephone Encounter (Signed)
Message copied by Daphine Deutscher on Fri Jul 12, 2011  8:47 AM ------      Message from: Hart Carwin      Created: Fri Jul 12, 2011  8:17 AM       Please call pt with negative Hep C test.

## 2011-07-15 ENCOUNTER — Ambulatory Visit (INDEPENDENT_AMBULATORY_CARE_PROVIDER_SITE_OTHER): Payer: BC Managed Care – PPO | Admitting: Surgery

## 2011-07-15 ENCOUNTER — Encounter (INDEPENDENT_AMBULATORY_CARE_PROVIDER_SITE_OTHER): Payer: Self-pay | Admitting: Surgery

## 2011-07-15 ENCOUNTER — Other Ambulatory Visit: Payer: Self-pay | Admitting: Internal Medicine

## 2011-07-15 ENCOUNTER — Other Ambulatory Visit: Payer: BC Managed Care – PPO

## 2011-07-15 VITALS — BP 114/74 | HR 68 | Temp 97.8°F | Resp 18 | Ht 65.5 in | Wt 150.0 lb

## 2011-07-15 DIAGNOSIS — Z8619 Personal history of other infectious and parasitic diseases: Secondary | ICD-10-CM

## 2011-07-15 DIAGNOSIS — L02419 Cutaneous abscess of limb, unspecified: Secondary | ICD-10-CM

## 2011-07-15 DIAGNOSIS — IMO0002 Reserved for concepts with insufficient information to code with codable children: Secondary | ICD-10-CM

## 2011-07-15 NOTE — Progress Notes (Signed)
Subjective:     Patient ID: Amanda Terrell, female   DOB: 21-Nov-1955, 55 y.o.   MRN: [id number]  HPI  She is here for followup visit. She has no complaints. She is almost done what her antibiotics Review of Systems     Objective:   Physical Exam On exam, the erythema has resolved. There is minimal induration    Assessment:     Patient status post incision and drainage of left axillary abscess    Plan:     I will see her back in 3 months to make sure this wasn't a sebaceous cyst

## 2011-07-16 LAB — HEPATITIS C RNA QUANTITATIVE

## 2011-07-18 ENCOUNTER — Telehealth: Payer: Self-pay | Admitting: *Deleted

## 2011-07-18 NOTE — Telephone Encounter (Signed)
Patient given results as per Dr. Brodie 

## 2011-07-18 NOTE — Telephone Encounter (Signed)
Message copied by Daphine Deutscher on Thu Jul 18, 2011  8:32 AM ------      Message from: Hart Carwin      Created: Wed Jul 17, 2011  5:42 PM       Please call pt with negative Hep C virus test . No measurable virus present.!! Good news!

## 2011-07-25 ENCOUNTER — Encounter (INDEPENDENT_AMBULATORY_CARE_PROVIDER_SITE_OTHER): Payer: Self-pay

## 2011-07-26 ENCOUNTER — Encounter (INDEPENDENT_AMBULATORY_CARE_PROVIDER_SITE_OTHER): Payer: Self-pay

## 2011-08-02 ENCOUNTER — Encounter (INDEPENDENT_AMBULATORY_CARE_PROVIDER_SITE_OTHER): Payer: Self-pay | Admitting: Dermatology

## 2011-10-08 ENCOUNTER — Encounter (INDEPENDENT_AMBULATORY_CARE_PROVIDER_SITE_OTHER): Payer: Self-pay | Admitting: Surgery

## 2011-10-08 ENCOUNTER — Ambulatory Visit (INDEPENDENT_AMBULATORY_CARE_PROVIDER_SITE_OTHER): Payer: BC Managed Care – PPO | Admitting: Surgery

## 2011-10-08 VITALS — BP 108/76 | HR 84 | Temp 97.6°F | Resp 12 | Ht 66.5 in | Wt 151.2 lb

## 2011-10-08 DIAGNOSIS — IMO0002 Reserved for concepts with insufficient information to code with codable children: Secondary | ICD-10-CM

## 2011-10-08 DIAGNOSIS — L02412 Cutaneous abscess of left axilla: Secondary | ICD-10-CM

## 2011-10-08 NOTE — Progress Notes (Signed)
Subjective:     Patient ID: Amanda Terrell, female   DOB: 12/18/1955, 56 y.o.   MRN: [id number]  HPI She is here for a long-term followup of her left axillary abscess to see if this had been a sebaceous cyst. She reports no problems whatsoever. She has had no open wounds and no drainage.  Review of Systems     Objective:   Physical Exam On exam, her incisions in the left axilla are totally healed. There are no underlying masses or anything to suggest sebaceous cysts    Assessment:     Patient with healed left axillary abscess with no evidence of sebaceous cyst    Plan:     I will see her back as needed

## 2012-03-05 ENCOUNTER — Telehealth: Payer: Self-pay | Admitting: Internal Medicine

## 2012-03-05 NOTE — Telephone Encounter (Signed)
If she is doing OK then she may be a direct EGD in Oct. 2013.

## 2012-03-05 NOTE — Telephone Encounter (Signed)
Patient is calling to see when EGD is due. Per recall 04/2012. She states she is still taking Motrin 2 tabs every 4 hours and Gabapentin every 4 hours for pain. She states her neuro MD wanted to be sure she had her EGD when due. She denies any new problems at this time. Should she be scheduled with next available or wait until Oct. Please, advise.

## 2012-04-03 ENCOUNTER — Encounter: Payer: Self-pay | Admitting: Internal Medicine

## 2012-04-17 ENCOUNTER — Encounter: Payer: Self-pay | Admitting: Internal Medicine

## 2012-05-27 ENCOUNTER — Encounter: Payer: Self-pay | Admitting: Internal Medicine

## 2012-05-27 ENCOUNTER — Ambulatory Visit (AMBULATORY_SURGERY_CENTER): Payer: BC Managed Care – PPO | Admitting: *Deleted

## 2012-05-27 VITALS — Ht 65.0 in | Wt 138.0 lb

## 2012-05-27 DIAGNOSIS — K2270 Barrett's esophagus without dysplasia: Secondary | ICD-10-CM

## 2012-06-10 ENCOUNTER — Other Ambulatory Visit: Payer: Self-pay | Admitting: *Deleted

## 2012-06-10 ENCOUNTER — Ambulatory Visit (AMBULATORY_SURGERY_CENTER): Payer: BC Managed Care – PPO | Admitting: Internal Medicine

## 2012-06-10 ENCOUNTER — Encounter: Payer: Self-pay | Admitting: Internal Medicine

## 2012-06-10 ENCOUNTER — Other Ambulatory Visit (INDEPENDENT_AMBULATORY_CARE_PROVIDER_SITE_OTHER): Payer: BC Managed Care – PPO

## 2012-06-10 VITALS — BP 100/74 | HR 67 | Temp 97.7°F | Resp 16 | Ht 65.0 in | Wt 138.0 lb

## 2012-06-10 DIAGNOSIS — K219 Gastro-esophageal reflux disease without esophagitis: Secondary | ICD-10-CM

## 2012-06-10 DIAGNOSIS — B1920 Unspecified viral hepatitis C without hepatic coma: Secondary | ICD-10-CM

## 2012-06-10 DIAGNOSIS — K2270 Barrett's esophagus without dysplasia: Secondary | ICD-10-CM

## 2012-06-10 LAB — CBC WITH DIFFERENTIAL/PLATELET
Eosinophils Relative: 0.7 % (ref 0.0–5.0)
Monocytes Absolute: 0.4 10*3/uL (ref 0.1–1.0)
Monocytes Relative: 6 % (ref 3.0–12.0)
Neutrophils Relative %: 69.5 % (ref 43.0–77.0)
Platelets: 258 10*3/uL (ref 150.0–400.0)
WBC: 6.9 10*3/uL (ref 4.5–10.5)

## 2012-06-10 LAB — COMPREHENSIVE METABOLIC PANEL
ALT: 13 U/L (ref 0–35)
Albumin: 4 g/dL (ref 3.5–5.2)
Alkaline Phosphatase: 63 U/L (ref 39–117)
Glucose, Bld: 87 mg/dL (ref 70–99)
Potassium: 4.4 mEq/L (ref 3.5–5.1)
Sodium: 140 mEq/L (ref 135–145)
Total Protein: 6.8 g/dL (ref 6.0–8.3)

## 2012-06-10 LAB — LIPID PANEL
LDL Cholesterol: 97 mg/dL (ref 0–99)
Total CHOL/HDL Ratio: 3
VLDL: 15.4 mg/dL (ref 0.0–40.0)

## 2012-06-10 LAB — TSH: TSH: 1.8 u[IU]/mL (ref 0.35–5.50)

## 2012-06-10 MED ORDER — POLYETHYLENE GLYCOL 3350 17 GM/SCOOP PO POWD: 17.0000 g | Freq: Every day | ORAL | Status: DC

## 2012-06-10 MED ORDER — SODIUM CHLORIDE 0.9 % IV SOLN: 500.0000 mL | INTRAVENOUS | Status: DC

## 2012-06-10 MED ORDER — OMEPRAZOLE 20 MG PO CPDR: 20.0000 mg | DELAYED_RELEASE_CAPSULE | Freq: Every day | ORAL | Status: DC

## 2012-06-10 NOTE — Progress Notes (Signed)
Discharge criteria entered in error around 1235. Charted on wrong chart.

## 2012-06-10 NOTE — Progress Notes (Signed)
Pt escorted to lab via wheelchair at 1323 for blood work ordered by Dr Juanda Chance.

## 2012-06-10 NOTE — Progress Notes (Signed)
Patient did not experience any of the following events: a burn prior to discharge; a fall within the facility; wrong site/side/patient/procedure/implant event; or a hospital transfer or hospital admission upon discharge from the facility. (G8907) Patient did not have preoperative order for IV antibiotic SSI prophylaxis. (G8918)  

## 2012-06-10 NOTE — Patient Instructions (Addendum)
Findings: Hiatal Hernia, Minimal Gastritis Recommendations:  Wait for pathology results, Continue PPI, Anti-reflux measures  YOU HAD AN ENDOSCOPIC PROCEDURE TODAY AT THE [facility]: Refer to the procedure report that was given to you for any specific questions about what was found during the examination.  If the procedure report does not answer your questions, please call your gastroenterologist to clarify.  If you requested that your care partner not be given the details of your procedure findings, then the procedure report has been included in a sealed envelope for you to review at your convenience later.  YOU SHOULD EXPECT: Some feelings of bloating in the abdomen. Passage of more gas than usual.  Walking can help get rid of the air that was put into your GI tract during the procedure and reduce the bloating. If you had a lower endoscopy (such as a colonoscopy or flexible sigmoidoscopy) you may notice spotting of blood in your stool or on the toilet paper. If you underwent a bowel prep for your procedure, then you may not have a normal bowel movement for a few days.  DIET: Your first meal following the procedure should be a light meal and then it is ok to progress to your normal diet.  A half-sandwich or bowl of soup is an example of a good first meal.  Heavy or fried foods are harder to digest and may make you feel nauseous or bloated.  Likewise meals heavy in dairy and vegetables can cause extra gas to form and this can also increase the bloating.  Drink plenty of fluids but you should avoid alcoholic beverages for 24 hours.  ACTIVITY: Your care partner should take you home directly after the procedure.  You should plan to take it easy, moving slowly for the rest of the day.  You can resume normal activity the day after the procedure however you should NOT DRIVE or use heavy machinery for 24 hours (because of the sedation medicines used during the test).    SYMPTOMS TO REPORT  IMMEDIATELY: A gastroenterologist can be reached at any hour.  During normal business hours, 8:30 AM to 5:00 PM Monday through Friday, call [phone].  After hours and on weekends, please call the GI answering service at [phone] who will take a message and have the physician on call contact you.   Following lower endoscopy (colonoscopy or flexible sigmoidoscopy):  Excessive amounts of blood in the stool  Significant tenderness or worsening of abdominal pains  Swelling of the abdomen that is new, acute  Fever of 100F or higher  Following upper endoscopy (EGD)  Vomiting of blood or coffee ground material  New chest pain or pain under the shoulder blades  Painful or persistently difficult swallowing  New shortness of breath  Fever of 100F or higher  Black, tarry-looking stools  FOLLOW UP: If any biopsies were taken you will be contacted by phone or by letter within the next 1-3 weeks.  Call your gastroenterologist if you have not heard about the biopsies in 3 weeks.  Our staff will call the home number listed on your records the next business day following your procedure to check on you and address any questions or concerns that you may have at that time regarding the information given to you following your procedure. This is a courtesy call and so if there is no answer at the home number and we have not heard from you through the emergency physician on call, we will assume that  you have returned to your regular daily activities without incident.  SIGNATURES/CONFIDENTIALITY: You and/or your care partner have signed paperwork which will be entered into your electronic medical record.  These signatures attest to the fact that that the information above on your After Visit Summary has been reviewed and is understood.  Full responsibility of the confidentiality of this discharge information lies with you and/or your care-partner.   Please follow all discharge instructions given to you  by the recovery room nurse. If you have any questions or problems after discharge please call one of the numbers listed above. You will receive a phone call in the am to see how you are doing and answer any questions you may have. Thank you for choosing [facility] for your health care needs.

## 2012-06-10 NOTE — Op Note (Signed)
[facility] [address] [place], [address]   ENDOSCOPY PROCEDURE REPORT  PATIENT: Amanda, Terrell  MR#: [id number] BIRTHDATE: 05/17/1956 , 56  yrs. old GENDER: Female ENDOSCOPIST: Hart Carwin, MD REFERRED BY:  Carylon Perches, M.D. PROCEDURE DATE:  06/10/2012 PROCEDURE:  EGD w/ biopsy ASA CLASS:     Class II INDICATIONS:  history of Barrett's esophagus.   Barrett;s on 2008 and 2003 EGD, no Barrett's 04/2009, takes Motrin 400mg  6x/day for back pain, hx Hep C ,treated in 1990ies , no HCV RNA on PCR. MEDICATIONS: MAC sedation, administered by CRNA and propofol (Diprivan) 150mg  IV TOPICAL ANESTHETIC: Cetacaine Spray  DESCRIPTION OF PROCEDURE: After the risks benefits and alternatives of the procedure were thoroughly explained, informed consent was obtained.  The LB-GIF Q180 [id number] endoscope was introduced through the mouth and advanced to the second portion of the duodenum. Without limitations.  The instrument was slowly withdrawn as the mucosa was fully examined.        ESOPHAGUS: A 1 cm hiatal hernia was noted.  Retroflexed views revealed no abnormalities.     The scope was then withdrawn from the patient and the procedure completed.  COMPLICATIONS: There were no complications. ENDOSCOPIC IMPRESSION: 1 cm hiatal hernia normal appearing z-line- Bx's taken minimal antral gastritis  RECOMMENDATIONS: 1.  Await pathology results 2.  continue PPI 3.  anti-reflux regimen to be follow  REPEAT EXAM: 5 year recall if no Barrett's found this time  eSigned:  Hart Carwin, MD 06/10/2012 12:43 PM   CC:  PATIENT NAME:  Amanda, Terrell MR#: [id number]

## 2012-06-11 ENCOUNTER — Other Ambulatory Visit (HOSPITAL_COMMUNITY): Payer: Self-pay | Admitting: Obstetrics & Gynecology

## 2012-06-11 ENCOUNTER — Telehealth: Payer: Self-pay | Admitting: *Deleted

## 2012-06-11 DIAGNOSIS — Z139 Encounter for screening, unspecified: Secondary | ICD-10-CM

## 2012-06-11 NOTE — Telephone Encounter (Signed)
  Follow up Call-  Call back number 06/10/2012  Post procedure Call Back phone  # [phone]  Permission to leave phone message Yes     Patient questions:  Do you have a fever, pain , or abdominal swelling? no Pain Score  0 *  Have you tolerated food without any problems? yes  Have you been able to return to your normal activities? yes  Do you have any questions about your discharge instructions: Diet   no Medications  no Follow up visit  no  Do you have questions or concerns about your Care? no  Actions: * If pain score is 4 or above: No action needed, pain <4.

## 2012-06-12 ENCOUNTER — Ambulatory Visit (HOSPITAL_COMMUNITY)
Admission: RE | Admit: 2012-06-12 | Discharge: 2012-06-12 | Disposition: A | Discharge status: Home / Self Care | Payer: BC Managed Care – PPO | Source: Ambulatory Visit | Attending: Obstetrics & Gynecology | Admitting: Obstetrics & Gynecology

## 2012-06-12 DIAGNOSIS — Z1231 Encounter for screening mammogram for malignant neoplasm of breast: Secondary | ICD-10-CM | POA: Insufficient documentation

## 2012-06-12 DIAGNOSIS — Z139 Encounter for screening, unspecified: Secondary | ICD-10-CM

## 2012-06-12 LAB — HEPATITIS C RNA QUANTITATIVE: HCV Quantitative: NOT DETECTED IU/mL (ref <15)

## 2012-06-15 ENCOUNTER — Encounter: Payer: Self-pay | Admitting: Internal Medicine

## 2012-06-17 ENCOUNTER — Other Ambulatory Visit: Payer: Self-pay | Admitting: Obstetrics & Gynecology

## 2012-06-17 DIAGNOSIS — R928 Other abnormal and inconclusive findings on diagnostic imaging of breast: Secondary | ICD-10-CM

## 2012-07-01 ENCOUNTER — Ambulatory Visit (HOSPITAL_COMMUNITY)
Admission: RE | Admit: 2012-07-01 | Discharge: 2012-07-01 | Disposition: A | Discharge status: Home / Self Care | Payer: BC Managed Care – PPO | Source: Ambulatory Visit | Attending: Obstetrics & Gynecology | Admitting: Obstetrics & Gynecology

## 2012-07-01 DIAGNOSIS — R928 Other abnormal and inconclusive findings on diagnostic imaging of breast: Secondary | ICD-10-CM | POA: Insufficient documentation

## 2013-04-27 ENCOUNTER — Telehealth: Payer: Self-pay | Admitting: Internal Medicine

## 2013-04-27 NOTE — Telephone Encounter (Signed)
Last colon 2009- first grade hemorrhoids. Please send Anusol HC supp, #12, 1 hs. I will see her.

## 2013-04-27 NOTE — Telephone Encounter (Signed)
Spoke with patient and she is asking if she needs labs done. She also reports some off and on rectal bleeding that she thinks is hemorrhoids. Blood is bright, red and with bowel movements. She has a lot of back pain and takes Motrin. She reports constipation alternating with diarrhea also. Offered OV with Dr. Juanda Chance. 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 Scheduled on 05/21/13 for OV. Does she need to have labs prior to this? Please, advise.

## 2013-04-28 MED ORDER — HYDROCORTISONE ACETATE 25 MG RE SUPP: RECTAL | Status: DC

## 2013-04-28 NOTE — Telephone Encounter (Signed)
Spoke with patient and gave her Dr. Juanda Chance recommendation. Rx sent.

## 2013-05-04 ENCOUNTER — Encounter: Payer: Self-pay | Admitting: *Deleted

## 2013-05-21 ENCOUNTER — Encounter: Payer: Self-pay | Admitting: Internal Medicine

## 2013-05-21 ENCOUNTER — Other Ambulatory Visit (INDEPENDENT_AMBULATORY_CARE_PROVIDER_SITE_OTHER): Payer: BC Managed Care – PPO

## 2013-05-21 ENCOUNTER — Ambulatory Visit (INDEPENDENT_AMBULATORY_CARE_PROVIDER_SITE_OTHER): Payer: BC Managed Care – PPO | Admitting: Internal Medicine

## 2013-05-21 VITALS — BP 100/60 | HR 72 | Ht 64.0 in | Wt 138.8 lb

## 2013-05-21 DIAGNOSIS — K648 Other hemorrhoids: Secondary | ICD-10-CM

## 2013-05-21 DIAGNOSIS — K625 Hemorrhage of anus and rectum: Secondary | ICD-10-CM

## 2013-05-21 LAB — COMPREHENSIVE METABOLIC PANEL
Albumin: 4.4 g/dL (ref 3.5–5.2)
Alkaline Phosphatase: 58 U/L (ref 39–117)
BUN: 16 mg/dL (ref 6–23)
Creatinine, Ser: 0.7 mg/dL (ref 0.4–1.2)
Glucose, Bld: 95 mg/dL (ref 70–99)
Total Bilirubin: 0.5 mg/dL (ref 0.3–1.2)

## 2013-05-21 LAB — CBC WITH DIFFERENTIAL/PLATELET
Basophils Relative: 0.7 % (ref 0.0–3.0)
Eosinophils Relative: 1.7 % (ref 0.0–5.0)
HCT: 42 % (ref 36.0–46.0)
Lymphs Abs: 2 10*3/uL (ref 0.7–4.0)
MCV: 86 fl (ref 78.0–100.0)
Monocytes Absolute: 0.5 10*3/uL (ref 0.1–1.0)
Neutro Abs: 4.9 10*3/uL (ref 1.4–7.7)
RBC: 4.88 Mil/uL (ref 3.87–5.11)
WBC: 7.5 10*3/uL (ref 4.5–10.5)

## 2013-05-21 LAB — TSH: TSH: 1.48 u[IU]/mL (ref 0.35–5.50)

## 2013-05-21 MED ORDER — HYDROCORTISONE ACETATE 25 MG RE SUPP: RECTAL | Status: DC

## 2013-05-21 MED ORDER — POLYETHYLENE GLYCOL 3350 17 GM/SCOOP PO POWD: ORAL | Status: DC

## 2013-05-21 MED ORDER — OMEPRAZOLE 20 MG PO CPDR: 20.0000 mg | DELAYED_RELEASE_CAPSULE | Freq: Every day | ORAL | Status: DC

## 2013-05-21 NOTE — Progress Notes (Signed)
Amanda Terrell March 28, 1956 MRN [id number]   History of Present Illness:  This is a 57 year old white female with a history of hepatitis C secondary to blood transfusions during several back operations in 1980ies.. She also has a history of internal hemorrhoids as well as a history of Barrett's esophagus. Her last upper endoscopy in November 2013 did not show any evidence of Barrett's esophagus. Prior endoscopies in 2003 and 2008 showed intestinal metaplasia. Her last colonoscopy was in February 2009 and it showed mild diverticulosis and internal hemorrhoids. A prior colonoscopy was completed in 2003. Patient is here today to discuss low-volume rectal bleeding associated with rectal soreness and pain. She has a history of constipation for which she takes MiraLax.   Past Medical History  Diagnosis Date  . Cellulitis and abscess of unspecified site   . Rosacea   . Other specified disease of hair and hair follicles   . Axillary abscess     left  . Heart murmur   . Scoliosis     adhesive arachnoditis-right side  . Hiatal hernia   . GERD (gastroesophageal reflux disease)   . Internal hemorrhoids   . Diverticulosis   . Esophageal stricture   . Barrett's esophagus   . Neuropathy   . IBS (irritable bowel syndrome)   . Hepatitis C   . Hyperlipidemia   . Mitral valve prolapse   . Reflex sympathetic dystrophy    Past Surgical History  Procedure Laterality Date  . Back surgery  1969, 1989, 2006    due to scoliosis= total 5 back surgeries  . Cesarean section  1976, 1978, 1980, 1984  . Incise and drain abcess      left axillary abscess  . Breast fibroadenoma surgery      bilateral  . Hysteroscopy    . Colonoscopy  2009  . Upper gastrointestinal endoscopy    . Tonsillectomy      reports that she has never smoked. She has never used smokeless tobacco. She reports that she drinks alcohol. She reports that she does not use illicit drugs. family history includes Brain cancer in her  father; Heart disease in her mother. There is no history of Colon cancer. No Known Allergies      Review of Systems: Weight loss of 25 pounds intentionally  The remainder of the 10 point ROS is negative except as outlined in H&P   Physical Exam: General appearance  Well developed, in no distress. Eyes- non icteric. HEENT nontraumatic, normocephalic. Mouth no lesions, tongue papillated, no cheilosis. Neck supple without adenopathy, thyroid not enlarged, no carotid bruits, no JVD. Lungs Clear to auscultation bilaterally. Cor normal S1, normal S2, regular rhythm, no murmur,  quiet precordium. Abdomen: Soft nontender with normoactive bowel sounds. Rectal: And anoscopy exam reveals no external hemorrhoids. Normal rectal sphincter tone. 3 internal hemorrhoids which were edematous and blue but there was no active bleeding. Stool was Hemoccult negative. Extremities no pedal edema. Skin no lesions. Neurological alert and oriented x 3., decreased sensation right foot Psychological normal mood and affect.  Assessment and Plan:  Problem #71 57 year old white female with symptomatic internal hemorrhoids. These have been confirmed on 2 prior colonoscopies. We will treat them with Anusol-HC suppositories, one at bedtime. We will refill her MiraLax 17 g when necessary. She is up-to-date on her colonoscopy. Her next exam will be due in 2019.  Problem #2 Barrett's esophagus. The last endoscopy in Nov. 2013 did not confirm Barrett's mucosa. A recall upper endoscopy will be due in  5 years.  Problem #3 Chronic constipation. Patient is to continue MiraLax.   05/21/2013 Amanda Terrell

## 2013-05-21 NOTE — Patient Instructions (Addendum)
We have sent the following medications to your pharmacy for you to pick up at your convenience: Miralax Prilosec Anusol  Your physician has requested that you go to the basement for the following lab work before leaving today: CBC, CMET, TSH  Cc: Dr Ouida Sills, Dr Trey Sailors

## 2013-07-29 DIAGNOSIS — C50919 Malignant neoplasm of unspecified site of unspecified female breast: Secondary | ICD-10-CM

## 2013-07-29 HISTORY — DX: Malignant neoplasm of unspecified site of unspecified female breast: C50.919

## 2013-11-23 ENCOUNTER — Other Ambulatory Visit: Payer: Self-pay | Admitting: *Deleted

## 2013-11-23 DIAGNOSIS — M79609 Pain in unspecified limb: Secondary | ICD-10-CM

## 2013-12-17 ENCOUNTER — Encounter: Payer: Self-pay | Admitting: Vascular Surgery

## 2013-12-21 ENCOUNTER — Ambulatory Visit (HOSPITAL_COMMUNITY)
Admission: RE | Admit: 2013-12-21 | Discharge: 2013-12-21 | Disposition: A | Discharge status: Home / Self Care | Payer: BC Managed Care – PPO | Source: Ambulatory Visit | Attending: Vascular Surgery | Admitting: Vascular Surgery

## 2013-12-21 ENCOUNTER — Encounter: Payer: Self-pay | Admitting: Vascular Surgery

## 2013-12-21 ENCOUNTER — Ambulatory Visit (INDEPENDENT_AMBULATORY_CARE_PROVIDER_SITE_OTHER): Payer: BC Managed Care – PPO | Admitting: Vascular Surgery

## 2013-12-21 VITALS — BP 124/82 | HR 65 | Resp 16 | Ht 60.5 in | Wt 139.2 lb

## 2013-12-21 DIAGNOSIS — M79609 Pain in unspecified limb: Secondary | ICD-10-CM

## 2013-12-21 NOTE — Progress Notes (Signed)
Patient name: Amanda Terrell MRN: [id number] DOB: 29-Jul-1956 Sex: female   Referred by: Carloyn Manner  Reason for referral:  Chief Complaint  Patient presents with  . New Evaluation    right leg cold ,pain and discoloration  lab prior    HISTORY OF PRESENT ILLNESS: The patient has today for evaluation of right lower extremity symptoms. She is very complex past medical history. She has had multiple prior back surgeries dating back to 1969 for scoliosis. She has chronic pain and weakness in the right side of her body. She has noted swelling in her right leg and notices that discoloration. She seen today to rule out any evidence of arterial insufficiency. She has no history of DVT.  Past Medical History  Diagnosis Date  . Cellulitis and abscess of unspecified site   . Rosacea   . Other specified disease of hair and hair follicles   . Axillary abscess     left  . Heart murmur   . Scoliosis     adhesive arachnoditis-right side  . Hiatal hernia   . GERD (gastroesophageal reflux disease)   . Internal hemorrhoids   . Diverticulosis   . Esophageal stricture   . Barrett's esophagus   . Neuropathy   . IBS (irritable bowel syndrome)   . Hepatitis C   . Hyperlipidemia   . Mitral valve prolapse   . Reflex sympathetic dystrophy     Past Surgical History  Procedure Laterality Date  . Back surgery  1969, 1989, 2006    due to scoliosis= total 5 back surgeries  . Cesarean section  1976, 1978, 1980, 1984  . Incise and drain abcess      left axillary abscess  . Breast fibroadenoma surgery      bilateral  . Hysteroscopy    . Colonoscopy  2009  . Upper gastrointestinal endoscopy    . Tonsillectomy      History   Social History  . Marital Status: Married    Spouse Name: N/A    Number of Children: N/A  . Years of Education: N/A   Occupational History  . Not on file.   Social History Main Topics  . Smoking status: Never Smoker   . Smokeless tobacco: Never Used  . Alcohol Use:  Yes     Comment: 1 glass wine a month  . Drug Use: No  . Sexual Activity: Not on file   Other Topics Concern  . Not on file   Social History Narrative  . No narrative on file    Family History  Problem Relation Age of Onset  . Heart disease Mother   . Brain cancer Father   . Colon cancer Neg Hx     Allergies as of 12/21/2013  . (No Known Allergies)    Current Outpatient Prescriptions on File Prior to Visit  Medication Sig Dispense Refill  . Coenzyme Q10 (CO Q-10 PO) Take 1 tablet by mouth daily.      . diphenhydrAMINE (BENADRYL) 25 mg capsule Take 25 mg by mouth at bedtime as needed.      Marland Kitchen HYDROmorphone (DILAUDID) 4 MG tablet Take 1 tablet by mouth as needed.      . Ibuprofen (MOTRIN PO) Take 400 mg by mouth every 4 (four) hours.       . Ibuprofen-Diphenhydramine Cit (MOTRIN PM PO) Take by mouth.      . Omega-3 Fatty Acids (FISH OIL PO) Take 1 tablet by mouth daily.      Marland Kitchen  omeprazole (PRILOSEC) 20 MG capsule Take 1 capsule (20 mg total) by mouth daily.  90 capsule  1  . polyethylene glycol powder (GLYCOLAX/MIRALAX) powder Dissolve 17 grams (1 capful) in at least 8 ounces water/juice and drink daily.  527 g  6  . Thiamine HCl (VITAMIN B-1 PO) Take 1 tablet by mouth daily.      . hydrocortisone (ANUSOL-HC) 25 MG suppository Insert on rectally nightly  12 suppository  1  . MAGNESIUM PO Take 1 tablet by mouth daily.       No current facility-administered medications on file prior to visit.     REVIEW OF SYSTEMS:  Positives indicated with an "X"  CARDIOVASCULAR:  [ ]  chest pain   [ ]  chest pressure   [ ]  palpitations   [ ]  orthopnea   [ ]  dyspnea on exertion   [ ]  claudication   [x ] rest pain   [ ]  DVT   [ ]  phlebitis PULMONARY:   [ ]  productive cough   [ ]  asthma   [ ]  wheezing NEUROLOGIC:   [x ] weakness  x[ ]  paresthesias  [ ]  aphasia  [ ]  amaurosis  [ ]  dizziness HEMATOLOGIC:   [ ]  bleeding problems   [ ]  clotting disorders MUSCULOSKELETAL:  [ ]  joint pain   [ ]   joint swelling GASTROINTESTINAL: [ ]   blood in stool  [ ]   hematemesis GENITOURINARY:  [ ]   dysuria  [ ]   hematuria PSYCHIATRIC:  [ ]  history of major depression INTEGUMENTARY:  [ ]  rashes  [ ]  ulcers CONSTITUTIONAL:  [ ]  fever   [ ]  chills  PHYSICAL EXAMINATION:  General: The patient is a well-nourished female, in no acute distress. Vital signs are BP 124/82  Pulse 65  Resp 16  Ht 5' 0.5" (1.537 m)  Wt 139 lb 3.2 oz (63.141 kg)  BMI 26.73 kg/m2 Pulmonary: There is a good air exchange bilaterally without wheezing or rales.  Musculoskeletal: There are no major deformities.   Neurologic: Weakness on the right arm and right leg Skin: There are no ulcer or rashes noted. Psychiatric: The patient has normal affect. Cardiovascular: There is a regular rate and rhythm without significant murmur appreciated. 2+ dorsalis pedis pulses bilaterally. She does have some swelling and some cyanotic changes in her right foot and distal calf   [facility] Vascular Lab Studies:  Ordered and Independently Reviewed this shows a normal triphasic waveforms in her posterior tibial anterior tibial arteries bilaterally also normal ankle arm index  Impression and Plan:  A long discussion with the patient and her family present. I do not see any evidence of arterial insufficiency. I suspect that this is related to her chronic nerve injury. Explained that although her leg feels cold and seems cold this is related to a perfusion to the skin. I explained there is no risk for limb loss but that there is no option for treatment. She was reassured this discussion will see [place] again on an as-needed basis    Arvilla Meres Tenicia G[facility] [place] Office: [phone]

## 2014-01-22 ENCOUNTER — Other Ambulatory Visit: Payer: Self-pay | Admitting: Internal Medicine

## 2014-02-28 ENCOUNTER — Telehealth: Payer: Self-pay | Admitting: Internal Medicine

## 2014-02-28 NOTE — Telephone Encounter (Signed)
Patient states she has been having sore throats and wanted to know if Barrett's would cause this. Discussed Barrett's with patient. She is due for recall EGD in 2018. Scheduled annual OV Oct. 2, 15 at 3:15 PM.

## 2014-04-25 ENCOUNTER — Telehealth: Payer: Self-pay | Admitting: Internal Medicine

## 2014-04-25 DIAGNOSIS — B1920 Unspecified viral hepatitis C without hepatic coma: Secondary | ICD-10-CM

## 2014-04-25 NOTE — Telephone Encounter (Signed)
Please have these labs: CBC, B12, TSH, C-met, Hep C RNA by PCR,quantitative

## 2014-04-25 NOTE — Telephone Encounter (Signed)
Patient is scheduled for yearly f/u on 05/27/14 and is asking if she needs labs prior to [place]. Please, advise.

## 2014-04-26 NOTE — Telephone Encounter (Signed)
Labs in EPIC. Left a message for patient to call back. 

## 2014-04-26 NOTE — Telephone Encounter (Signed)
Patient notified of recommended labs. She will come for these prior to OV.

## 2014-04-29 ENCOUNTER — Ambulatory Visit: Payer: BC Managed Care – PPO | Admitting: Internal Medicine

## 2014-05-04 ENCOUNTER — Encounter: Payer: Self-pay | Admitting: Internal Medicine

## 2014-05-18 ENCOUNTER — Other Ambulatory Visit (INDEPENDENT_AMBULATORY_CARE_PROVIDER_SITE_OTHER): Payer: BC Managed Care – PPO

## 2014-05-18 DIAGNOSIS — G40219 Localization-related (focal) (partial) symptomatic epilepsy and epileptic syndromes with complex partial seizures, intractable, without status epilepticus: Secondary | ICD-10-CM

## 2014-05-18 DIAGNOSIS — B1920 Unspecified viral hepatitis C without hepatic coma: Secondary | ICD-10-CM

## 2014-05-18 LAB — CBC WITH DIFFERENTIAL/PLATELET
BASOS ABS: 0.1 10*3/uL (ref 0.0–0.1)
BASOS PCT: 0.9 % (ref 0.0–3.0)
EOS ABS: 0.2 10*3/uL (ref 0.0–0.7)
Eosinophils Relative: 2.3 % (ref 0.0–5.0)
HCT: 41.8 % (ref 36.0–46.0)
Hemoglobin: 13.7 g/dL (ref 12.0–15.0)
LYMPHS PCT: 20.4 % (ref 12.0–46.0)
Lymphs Abs: 1.7 10*3/uL (ref 0.7–4.0)
MCHC: 32.8 g/dL (ref 30.0–36.0)
MCV: 87.2 fl (ref 78.0–100.0)
MONO ABS: 0.6 10*3/uL (ref 0.1–1.0)
Monocytes Relative: 7.1 % (ref 3.0–12.0)
NEUTROS ABS: 5.8 10*3/uL (ref 1.4–7.7)
NEUTROS PCT: 69.3 % (ref 43.0–77.0)
Platelets: 251 10*3/uL (ref 150.0–400.0)
RBC: 4.8 Mil/uL (ref 3.87–5.11)
RDW: 14 % (ref 11.5–15.5)
WBC: 8.4 10*3/uL (ref 4.0–10.5)

## 2014-05-19 ENCOUNTER — Other Ambulatory Visit (INDEPENDENT_AMBULATORY_CARE_PROVIDER_SITE_OTHER): Payer: BC Managed Care – PPO

## 2014-05-19 DIAGNOSIS — G40219 Localization-related (focal) (partial) symptomatic epilepsy and epileptic syndromes with complex partial seizures, intractable, without status epilepticus: Secondary | ICD-10-CM

## 2014-05-19 LAB — COMPREHENSIVE METABOLIC PANEL WITH GFR
ALT: 15 U/L (ref 0–35)
AST: 17 U/L (ref 0–37)
Albumin: 3.8 g/dL (ref 3.5–5.2)
Alkaline Phosphatase: 65 U/L (ref 39–117)
BUN: 20 mg/dL (ref 6–23)
CO2: 30 meq/L (ref 19–32)
Calcium: 9.6 mg/dL (ref 8.4–10.5)
Chloride: 103 meq/L (ref 96–112)
Creatinine, Ser: 0.6 mg/dL (ref 0.4–1.2)
GFR: 100.59 mL/min
Glucose, Bld: 104 mg/dL — ABNORMAL HIGH (ref 70–99)
Potassium: 3.6 meq/L (ref 3.5–5.1)
Sodium: 140 meq/L (ref 135–145)
Total Bilirubin: 0.6 mg/dL (ref 0.2–1.2)
Total Protein: 7.3 g/dL (ref 6.0–8.3)

## 2014-05-19 LAB — TSH: TSH: 2.09 u[IU]/mL (ref 0.35–4.50)

## 2014-05-19 LAB — VITAMIN B12: Vitamin B-12: 349 pg/mL (ref 211–911)

## 2014-05-20 LAB — HEPATITIS C RNA QUANTITATIVE: HCV QUANT: NOT DETECTED [IU]/mL (ref <15)

## 2014-05-27 ENCOUNTER — Encounter: Payer: Self-pay | Admitting: Internal Medicine

## 2014-05-27 ENCOUNTER — Ambulatory Visit (INDEPENDENT_AMBULATORY_CARE_PROVIDER_SITE_OTHER): Payer: BC Managed Care – PPO | Admitting: Internal Medicine

## 2014-05-27 VITALS — BP 110/70 | HR 76 | Ht 64.0 in | Wt 137.5 lb

## 2014-05-27 DIAGNOSIS — K219 Gastro-esophageal reflux disease without esophagitis: Secondary | ICD-10-CM

## 2014-05-27 DIAGNOSIS — B1920 Unspecified viral hepatitis C without hepatic coma: Secondary | ICD-10-CM

## 2014-05-27 DIAGNOSIS — K2270 Barrett's esophagus without dysplasia: Secondary | ICD-10-CM

## 2014-05-27 MED ORDER — POLYETHYLENE GLYCOL 3350 17 GM/SCOOP PO POWD: ORAL | Status: DC

## 2014-05-27 MED ORDER — OMEPRAZOLE 20 MG PO CPDR: 20.0000 mg | DELAYED_RELEASE_CAPSULE | Freq: Two times a day (BID) | ORAL | Status: DC

## 2014-05-27 NOTE — Patient Instructions (Addendum)
We have sent the following medications to your pharmacy for you to pick up at your convenience: Prilosec 20 mg twice daily Miralax  You will be due for a recall endoscopy in 05/2015. We will send you a reminder in the mail when it gets closer to that time.

## 2014-05-27 NOTE — Progress Notes (Signed)
Amanda Terrell 1956/01/05 [id number]  Note: This dictation was prepared with Dragon digital system. Any transcriptional errors that result from this procedure are unintentional.   History of Present Illness: This is a 58 year old white female with hx of hepatitis C. Negative viral load after treatment with interferon. She has  a history of Barrett's esophagus on prior endoscopy in 2003 and 2008 but no Barrett's was found in November 2013 endoscopy. She has had intermittent low volume rectal bleeding attributed to hemorrhoids. She takes MiraLAX for constipation. She is here to refill omeprazole 20 mg daily. Patient has a severe lumbar and thoracic spine disease. 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 She has difficult ambulation because of peripheral neuropathy. She has chronic pain for which she takes Dilaudid and  ibuprofen 400 mg every 4 hours up to 8-10 at day.  As a result ,she has intermittent epigastric pain    Past Medical History  Diagnosis Date  . Cellulitis and abscess of unspecified site   . Rosacea   . Other specified disease of hair and hair follicles   . Axillary abscess     left  . Heart murmur   . Scoliosis     adhesive arachnoditis-right side  . Hiatal hernia   . GERD (gastroesophageal reflux disease)   . Internal hemorrhoids   . Diverticulosis   . Esophageal stricture   . Barrett's esophagus   . Neuropathy   . IBS (irritable bowel syndrome)   . Hepatitis C   . Hyperlipidemia   . Mitral valve prolapse   . Reflex sympathetic dystrophy     Past Surgical History  Procedure Laterality Date  . Back surgery  1969, 1989, 2006    due to scoliosis= total 5 back surgeries  . Cesarean section  1976, 1978, 1980, 1984  . Incise and drain abcess      left axillary abscess  . Breast fibroadenoma surgery      bilateral  . Hysteroscopy    . Colonoscopy  2009  . Upper gastrointestinal endoscopy     . Tonsillectomy      No Known Allergies  Family history and social history have been reviewed.  Review of Systems: Intermittent epigastric pain. Denies dysphagia. Chronic constipation for which she takes MiraLAX  The remainder of the 10 point ROS is negative except as outlined in the H&P  Physical Exam: General Appearance Well developed, in no distress Eyes  Non icteric  HEENT  Non traumatic, normocephalic  Mouth No lesion, tongue papillated, no cheilosis Neck Supple without adenopathy, thyroid not enlarged, no carotid bruits, no JVD Lungs Clear to auscultation bilaterally COR Normal S1, normal S2, regular rhythm, no murmur, quiet precordium Abdomen liver edge at costal margin. Minimal tenderness along left costal margin Rectal soft Hemoccult negative stool Extremities  No pedal edema Skin No lesions Neurological Alert and oriented x 3 Psychological Normal mood and affect  Assessment and Plan:   Chronic gastroesophageal reflux disease and Barrett's esophagus documented on last upper endoscopy November 2013. She will be due for repeat endoscopy in November 2016. 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 Her hemoglobin is normal and her stool is Hemoccult negative  Colorectal screening. Last colonoscopy in 2009. Recall colonoscopy February 2019  History of hepatitis C secondary to blood transfusions in 1980s. Treated with interferon in the early 2000 and. Negative viral load    Delfin Edis 05/27/2014

## 2014-06-29 ENCOUNTER — Other Ambulatory Visit (HOSPITAL_COMMUNITY): Payer: Self-pay | Admitting: Obstetrics & Gynecology

## 2014-06-29 DIAGNOSIS — N6320 Unspecified lump in the left breast, unspecified quadrant: Secondary | ICD-10-CM

## 2014-07-05 ENCOUNTER — Ambulatory Visit (HOSPITAL_COMMUNITY)
Admission: RE | Admit: 2014-07-05 | Discharge: 2014-07-05 | Disposition: A | Discharge status: Home / Self Care | Payer: BC Managed Care – PPO | Source: Ambulatory Visit | Attending: Internal Medicine | Admitting: Internal Medicine

## 2014-07-05 ENCOUNTER — Other Ambulatory Visit (HOSPITAL_COMMUNITY): Payer: Self-pay | Admitting: Internal Medicine

## 2014-07-05 ENCOUNTER — Ambulatory Visit (HOSPITAL_COMMUNITY)
Admission: RE | Admit: 2014-07-05 | Discharge: 2014-07-05 | Disposition: A | Discharge status: Home / Self Care | Payer: BC Managed Care – PPO | Source: Ambulatory Visit | Attending: Obstetrics & Gynecology | Admitting: Obstetrics & Gynecology

## 2014-07-05 ENCOUNTER — Other Ambulatory Visit: Payer: Self-pay | Admitting: Obstetrics & Gynecology

## 2014-07-05 ENCOUNTER — Other Ambulatory Visit (HOSPITAL_COMMUNITY): Payer: Self-pay | Admitting: Obstetrics & Gynecology

## 2014-07-05 DIAGNOSIS — N6320 Unspecified lump in the left breast, unspecified quadrant: Secondary | ICD-10-CM

## 2014-07-05 DIAGNOSIS — N63 Unspecified lump in breast: Secondary | ICD-10-CM | POA: Insufficient documentation

## 2014-07-07 ENCOUNTER — Ambulatory Visit
Admission: RE | Admit: 2014-07-07 | Discharge: 2014-07-07 | Disposition: A | Discharge status: Home / Self Care | Payer: BC Managed Care – PPO | Source: Ambulatory Visit | Attending: Obstetrics & Gynecology | Admitting: Obstetrics & Gynecology

## 2014-07-07 ENCOUNTER — Other Ambulatory Visit: Payer: Self-pay | Admitting: Obstetrics & Gynecology

## 2014-07-07 DIAGNOSIS — N6320 Unspecified lump in the left breast, unspecified quadrant: Secondary | ICD-10-CM

## 2014-07-08 ENCOUNTER — Other Ambulatory Visit: Payer: Self-pay | Admitting: Obstetrics & Gynecology

## 2014-07-08 DIAGNOSIS — D0512 Intraductal carcinoma in situ of left breast: Secondary | ICD-10-CM

## 2014-07-11 ENCOUNTER — Encounter: Payer: Self-pay | Admitting: *Deleted

## 2014-07-11 ENCOUNTER — Telehealth: Payer: Self-pay | Admitting: *Deleted

## 2014-07-11 DIAGNOSIS — C50412 Malignant neoplasm of upper-outer quadrant of left female breast: Secondary | ICD-10-CM | POA: Insufficient documentation

## 2014-07-11 HISTORY — DX: Malignant neoplasm of upper-outer quadrant of left female breast: C50.412

## 2014-07-11 NOTE — Telephone Encounter (Signed)
Confirmed [facility] for 07/13/14 at 1200.  Instructions and contact information given.

## 2014-07-12 ENCOUNTER — Ambulatory Visit
Admission: RE | Admit: 2014-07-12 | Discharge: 2014-07-12 | Disposition: A | Discharge status: Home / Self Care | Payer: BC Managed Care – PPO | Source: Ambulatory Visit | Attending: Obstetrics & Gynecology | Admitting: Obstetrics & Gynecology

## 2014-07-12 DIAGNOSIS — D0512 Intraductal carcinoma in situ of left breast: Secondary | ICD-10-CM

## 2014-07-12 MED ORDER — GADOBENATE DIMEGLUMINE 529 MG/ML IV SOLN
12.0000 mL | Freq: Once | INTRAVENOUS | Status: AC | PRN
  Administered 2014-07-12: 12 mL via INTRAVENOUS

## 2014-07-13 ENCOUNTER — Encounter: Payer: Self-pay | Admitting: Hematology

## 2014-07-13 ENCOUNTER — Ambulatory Visit (HOSPITAL_BASED_OUTPATIENT_CLINIC_OR_DEPARTMENT_OTHER): Payer: BC Managed Care – PPO | Admitting: Hematology

## 2014-07-13 ENCOUNTER — Other Ambulatory Visit (INDEPENDENT_AMBULATORY_CARE_PROVIDER_SITE_OTHER): Payer: Self-pay | Admitting: General Surgery

## 2014-07-13 ENCOUNTER — Ambulatory Visit
Admission: RE | Admit: 2014-07-13 | Discharge: 2014-07-13 | Disposition: A | Discharge status: Home / Self Care | Payer: BC Managed Care – PPO | Source: Ambulatory Visit | Attending: Radiation Oncology | Admitting: Radiation Oncology

## 2014-07-13 ENCOUNTER — Other Ambulatory Visit (HOSPITAL_BASED_OUTPATIENT_CLINIC_OR_DEPARTMENT_OTHER): Payer: BC Managed Care – PPO

## 2014-07-13 ENCOUNTER — Ambulatory Visit: Payer: BC Managed Care – PPO

## 2014-07-13 VITALS — BP 127/75 | HR 75 | Temp 98.5°F | Resp 18 | Ht 64.0 in | Wt 138.6 lb

## 2014-07-13 DIAGNOSIS — D0512 Intraductal carcinoma in situ of left breast: Secondary | ICD-10-CM

## 2014-07-13 DIAGNOSIS — C50912 Malignant neoplasm of unspecified site of left female breast: Secondary | ICD-10-CM

## 2014-07-13 DIAGNOSIS — Z170 Estrogen receptor positive status [ER+]: Secondary | ICD-10-CM

## 2014-07-13 DIAGNOSIS — C50412 Malignant neoplasm of upper-outer quadrant of left female breast: Secondary | ICD-10-CM

## 2014-07-13 DIAGNOSIS — Z01818 Encounter for other preprocedural examination: Secondary | ICD-10-CM

## 2014-07-13 LAB — COMPREHENSIVE METABOLIC PANEL (CC13)
ALT: 14 U/L (ref 0–55)
ANION GAP: 9 meq/L (ref 3–11)
AST: 17 U/L (ref 5–34)
Albumin: 4.4 g/dL (ref 3.5–5.0)
Alkaline Phosphatase: 78 U/L (ref 40–150)
BILIRUBIN TOTAL: 0.58 mg/dL (ref 0.20–1.20)
BUN: 12.6 mg/dL (ref 7.0–26.0)
CALCIUM: 9.9 mg/dL (ref 8.4–10.4)
CHLORIDE: 103 meq/L (ref 98–109)
CO2: 30 meq/L — AB (ref 22–29)
CREATININE: 0.7 mg/dL (ref 0.6–1.1)
GLUCOSE: 92 mg/dL (ref 70–140)
Potassium: 4.3 mEq/L (ref 3.5–5.1)
SODIUM: 142 meq/L (ref 136–145)
Total Protein: 7.1 g/dL (ref 6.4–8.3)

## 2014-07-13 LAB — CBC WITH DIFFERENTIAL/PLATELET
BASO%: 0.8 % (ref 0.0–2.0)
Basophils Absolute: 0.1 10*3/uL (ref 0.0–0.1)
EOS ABS: 0.1 10*3/uL (ref 0.0–0.5)
EOS%: 1.2 % (ref 0.0–7.0)
HCT: 45.7 % (ref 34.8–46.6)
HGB: 14.7 g/dL (ref 11.6–15.9)
LYMPH#: 1.3 10*3/uL (ref 0.9–3.3)
LYMPH%: 18.2 % (ref 14.0–49.7)
MCH: 28.2 pg (ref 25.1–34.0)
MCHC: 32.1 g/dL (ref 31.5–36.0)
MCV: 87.9 fL (ref 79.5–101.0)
MONO#: 0.5 10*3/uL (ref 0.1–0.9)
MONO%: 7.2 % (ref 0.0–14.0)
NEUT%: 72.6 % (ref 38.4–76.8)
NEUTROS ABS: 5.1 10*3/uL (ref 1.5–6.5)
Platelets: 244 10*3/uL (ref 145–400)
RBC: 5.2 10*6/uL (ref 3.70–5.45)
RDW: 13.5 % (ref 11.2–14.5)
WBC: 7.1 10*3/uL (ref 3.9–10.3)

## 2014-07-13 NOTE — Progress Notes (Signed)
  Radiation Oncology         [phone] ________________________________  Initial Outpatient Consultation - Date: 07/13/2014   Name: Amanda Terrell MRN: [id number]   DOB: 1955-08-23  REFERRING PHYSICIAN: Fanny Skates, MD  DIAGNOSIS:    ICD-9-CM ICD-10-CM   1. Breast cancer of upper-outer quadrant of left female breast 174.4 C50.412     STAGE: Breast cancer of upper-outer quadrant of left female breast   Staging form: Breast, AJCC 7th Edition     Clinical stage from 07/13/2014: Stage 0 (Tis (DCIS), N0, M0) - Unsigned  HISTORY OF PRESENT ILLNESS::Amanda Terrell is a 58 y.o. female palpated a left breast mass. A 1 cm mass was seen in ultrasound. 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 MRI showed a 2.0 x 1.4 x 1.3 cm area of abnormality.  She is feeling well after her biopsy. She has an appointment with a neurosurgeon at [facility] on January 14 for opinion regarding her spinal issues which she has struggled with for many years and her progressive lower extremity weakness. She has no personal history of breast cancer. She is GXP3 with menses at 43 and she is post menopausal.  She had her first child at 46 and stopped using HRT after 2 years many years ago.  She also has a history of Hepatitis C for which she underwent Interferon and is "cured". She is accompanied by her husband and daughter.   PREVIOUS RADIATION THERAPY: No  FAMILY HISTORY:  Family History  Problem Relation Age of Onset  . Heart disease Mother   . Brain cancer Father   . Colon cancer Neg Hx   . Melanoma Paternal Grandmother     SOCIAL HISTORY:  History  Substance Use Topics  . Smoking status: Never Smoker   . Smokeless tobacco: Never Used  . Alcohol Use: Yes     Comment: 1 glass wine a month    REVIEW OF SYSTEMS:  A 15 point review of systems is documented in the electronic medical record. This was obtained by the nursing staff. However, I reviewed this with the patient to discuss  relevant findings and make appropriate changes.  Pertinent positives are included in the chart.   PHYSICAL EXAM: Pleasant female. No distress. Alert and oriented. Bruising over the upper outer quadrant. No palpable lymphadenopathy. No palpable abnormalities of the right breast. Small ptotic breasts bilaterally.   IMPRESSION: DCIS of the left breast  PLAN: I spoke to the patient today regarding her diagnosis and options for treatment. We discussed the equivalence in terms of survival and local failure between mastectomy and breast conservation. We discussed the role of radiation in decreasing local failures in patients who undergo lumpectomy. We discussed the process of simulation and the placement tattoos. We discussed 4 weeks of treatment as an outpatient. We discussed the possibility of asymptomatic lung damage. We discussed the low likelihood of secondary malignancies. We discussed the possible side effects including but not limited to skin redness, fatigue, permanent skin darkening, and breast swelling.   We discussed treatment in [place] and in [place] and she prefers to be treated here.    She met with surgery, medical oncology, our navigators and a member of our patient and family support staff.   I spent 40 minutes  face to face with the patient and more than 50% of that time was spent in counseling and/or coordination of care.   ------------------------------------------------  Thea Silversmith, MD

## 2014-07-13 NOTE — Progress Notes (Signed)
[place]  [phone] Fax:([phone]) [place] Note   Patient Care Team: [name], MD as PCP - General (Internal Medicine) Glenna Fellows, MD as Referring Physician (Neurosurgery) Fanny Skates, MD as Consulting Physician (General Surgery) Truitt Merle, MD as Consulting Physician (Hematology) Thea Silversmith, MD as Consulting Physician (Radiation Oncology) 07/13/2014  CHIEF COMPLAINTS/PURPOSE OF CONSULTATION:  Newly diagnosed left breast DCIS.    Breast cancer of upper-outer quadrant of left female breast   07/05/2014 Initial Biopsy DCIS with necrosis. ER 100% positive, PR 59% positive.    07/05/2014 Imaging mammogram and [place]: 1.1 cm suspicious mass within the left breast at the 2:00 position. There are associated suspicious calcifications. Breast MRI showed 2 cm mass in the upper upper outer quadrant of left breast, otherwise negative.   07/07/2014 Initial Diagnosis Breast cancer of upper-outer quadrant of left female breast     HISTORY OF PRESENTING ILLNESS:  Amanda Terrell 58 y.o. female with past medical history significant for scoliosis status post multiple spinal surgery, who presents to our breast multidisciplinary clinic today to discuss her newly diagnosed breast cancer.  She noticed a left breast lump about one month ago, no tender, she otherwise feels well, no other symptoms. She underwent a screening mammogram, diagnostic program and ultrasound which showed a 1.1 cm mass within the left breast at the 2:00 position, associate with suspicious calcification. The breast mass measures 2 cm in her MRI. She underwent core needle biopsy on 07/07/2014, which showed DCIS was strongly ER and PR positive.  She had 5 spinal surgery for scoliosis, has chronic right leg numbness and weakness, she uses a cane, no back pain, last surgery in 2006. She denies history of osteoporosis, no other joint pain. 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   MEDICAL HISTORY:  Past Medical History  Diagnosis Date  . Cellulitis and abscess of unspecified site   . Rosacea   . Other specified disease of hair and hair follicles   . Axillary abscess     left  . Heart murmur   . Scoliosis     adhesive arachnoditis-right side  . Hiatal hernia   . GERD (gastroesophageal reflux disease)   . Internal hemorrhoids   . Diverticulosis   . Esophageal stricture   . Barrett's esophagus   . Neuropathy   . IBS (irritable bowel syndrome)   . Hepatitis C, treated,    . Hyperlipidemia   . Mitral valve prolapse   . Reflex sympathetic dystrophy   . Breast cancer of upper-outer quadrant of left female breast 07/11/2014  . Arthritis     SURGICAL HISTORY: Past Surgical History  Procedure Laterality Date  . Back surgery  1969, 1989, 2006    due to scoliosis= total 5 back surgeries  . Cesarean section  1976, 1978, 1980, 1984  . Incise and drain abcess      left axillary abscess  . Breast fibroadenoma surgery   1990    bilateral  . Hysteroscopy    . Colonoscopy  2009  . Upper gastrointestinal endoscopy    . Tonsillectomy    . Spinal surgeries      SOCIAL HISTORY: History   Social History  . Marital Status: Married    Spouse Name: N/A    Number of Children: 3 daughters, age of 80-31   . Years of Education: N/A   Occupational History  .  Business owner, works from home    Social History Main Topics  . Smoking status: Never Smoker   . Smokeless tobacco: Never Used  . Alcohol Use: Yes     Comment: 1 glass wine a month  . Drug Use: No  . Sexual Activity: Not on file   Other Topics Concern  . Not on file   Social History Narrative    FAMILY HISTORY: Family History  Problem Relation Age of Onset  . Heart disease Mother   . Brain cancer Father   . Colon cancer Neg Hx   . Melanoma Paternal Grandmother     ALLERGIES:  has No Known Allergies.  MEDICATIONS:  Current  Outpatient Prescriptions  Medication Sig Dispense Refill  . Coenzyme Q10 (CO Q-10 PO) Take 1 tablet by mouth daily.    . diphenhydrAMINE (BENADRYL) 25 mg capsule Take 25 mg by mouth at bedtime as needed.    Marland Kitchen HYDROmorphone (DILAUDID) 4 MG tablet Take 1 tablet by mouth as needed.    . Ibuprofen (MOTRIN PO) Take 400 mg by mouth every 4 (four) hours.     . Multiple Vitamin (MULTIVITAMIN) tablet Take 1 tablet by mouth daily.    Marland Kitchen omeprazole (PRILOSEC) 20 MG capsule Take 1 capsule (20 mg total) by mouth 2 (two) times daily before a meal. 180 capsule 3  . polyethylene glycol powder (GLYCOLAX/MIRALAX) powder Dissolve 17 grams (1 capful) in at least 8 ounces water/juice and drink daily. 527 g 6  . Thiamine HCl (VITAMIN B-1 PO) Take 1 tablet by mouth daily.    Marland Kitchen VITAMIN D, CHOLECALCIFEROL, PO Take 1 tablet by mouth daily.     No current facility-administered medications for this visit.    REVIEW OF SYSTEMS:   Constitutional: Denies fevers, chills or abnormal night sweats Eyes: Denies blurriness of vision, double vision or watery eyes Ears, nose, mouth, throat, and face: Denies mucositis or sore throat Respiratory: Denies cough, dyspnea or wheezes Cardiovascular: Denies palpitation, chest discomfort or lower extremity swelling Gastrointestinal:  Denies nausea, heartburn or change in bowel habits Skin: Denies abnormal skin rashes Lymphatics: Denies new lymphadenopathy or easy bruising Neurological: She has chronic right leg numbness, and weakness. Behavioral/Psych: Mood is stable, no new changes  All other systems were reviewed with the patient and are negative.  PHYSICAL EXAMINATION: ECOG PERFORMANCE STATUS: 1 - Symptomatic but completely ambulatory  Filed Vitals:   07/13/14 1308  BP: 127/75  Pulse: 75  Temp: 98.5 F (36.9 C)  Resp: 18   Filed Weights   07/13/14 1308  Weight: 138 lb 9.6 oz (62.869 kg)    GENERAL:alert, no distress and comfortable SKIN: skin color, texture, turgor  are normal, no rashes or significant lesions EYES: normal, conjunctiva are pink and non-injected, sclera clear OROPHARYNX:no exudate, no erythema and lips, buccal mucosa, and tongue normal  NECK: supple, thyroid normal size, non-tender, without nodularity LYMPH:  no palpable lymphadenopathy in the cervical, axillary or inguinal LUNGS: clear to auscultation and percussion with normal breathing effort HEART: regular rate & rhythm and no murmurs and no lower extremity edema ABDOMEN:abdomen soft, non-tender and normal bowel sounds Musculoskeletal:no cyanosis of digits and no clubbing  PSYCH: alert & oriented x 3 with fluent speech NEURO: no focal motor/sensory deficits Breasts: Breast inspection showed them to be symmetrical with no nipple discharge. Skin bruise at the left breast upper inner quadrant at biopsy site, a palpable 2 cm mass at the surgical biopsy site, nontender. Palpation of the right breasts and axilla  revealed no obvious mass that I could appreciate.   LABORATORY DATA:  I have reviewed the data as listed Lab Results  Component Value Date   WBC 7.1 07/13/2014   HGB 14.7 07/13/2014   HCT 45.7 07/13/2014   MCV 87.9 07/13/2014   PLT 244 07/13/2014    Recent Labs  05/19/14 0938 07/13/14 1220  NA 140 142  K 3.6 4.3  CL 103  --   CO2 30 30*  GLUCOSE 104* 92  BUN 20 12.6  CREATININE 0.6 0.7  CALCIUM 9.6 9.9  PROT 7.3 7.1  ALBUMIN 3.8 4.4  AST 17 17  ALT 15 14  ALKPHOS 65 78  BILITOT 0.6 0.58   PATHOLOGY REPORT 07/07/2014 IMMUNOHISTOCHEMICAL AND MORPHOMETRIC ANALYSIS BY THE AUTOMATED CELLULAR IMAGING SYSTEM (ACIS) Estrogen Receptor: 100%, POSITIVE, STRONG STAINING INTENSITY Progesterone Receptor: 59%, POSITIVE, STRONG STAINING INTENSITY REFERENCE RANGE ESTROGEN RECEPTOR NEGATIVE <1% POSITIVE =>1% PROGESTERONE RECEPTOR NEGATIVE <1% POSITIVE =>1% All controls stained appropriately Enid Cutter MD Pathologist, Electronic Signature ( Signed 07/13/2014) FINAL  DIAGNOSIS Diagnosis Breast, left, needle core biopsy, mass, 2 o'clock - DUCTAL CARCINOMA WITH CALCIFICATIONS. - SEE COMMENT. Microscopic Comment Smooth muscle myosin, calponin, and p63 stains highlight the presence of myoepithelial cells. The 1 of 2 FINAL for TIDA, SANER [phone]) Microscopic Comment(continued) carcinoma is intermediate grade. Estrogen and progesterone receptor studies will be performed and the results reported separately. The results were called to [facility] on 07/11/14. Enid Cutter MD Pathologist, Electronic Signature (Case signed 07/11/2014) Specimen Gross and Clinical Information  RADIOGRAPHIC STUDIES: ER 100% positive I have personally reviewed the radiological images as listed and agreed with the findings in the report. Mr Breast Bilateral W Wo Contrast  07/12/2014    IMPRESSION: 1. 2.0 x 1.4 x 1.3 cm biopsy-proven ductal carcinoma deep in the upper-outer quadrant of the left breast. 2. No evidence of malignancy elsewhere in either breast and no adenopathy. 3. Stable postsurgical fusion of the mid and lower thoracic spine with moderate-to-marked scoliosis.    Mm Digital Diagnostic and [place] Bilat 07/05/2014   IMPRESSION: 1.1 cm suspicious mass located within the left breast at 2 o'clock position 6 cm from the nipple corresponding to the mammographic and palpable finding. There are associated suspicious calcifications. The calcifications and mass together span 1.5 cm mammographically. Left breast ultrasound-guided core biopsy is recommended.     ASSESSMENT & PLAN:  58 year old female with past medical history significant for scoliosis, status post multiple spine surgery, with chronic right leg numbness and weakness, who was found to have a left breast DCIS.  1. Left breast DCIS, ER 100% positive, PR 59% of positive -She has met our surgeon Dr. Dalbert Batman today, and likely will have a lumpectomy for her breast DCIS. If surgical margins are  all negative, she is considered cured by surgery. -We discussed the risks of cancer recurrence in her breasts, and a small risks of distant metastasis in the future. I recommend adjuvant aromatase inhibitor or tamoxifen to reduce the risks of cancer recurrence. Given her significant spinal disease and leg weakness and numbness, the risks of osteoporosis from aromatase inhibitor , I probably would recommend tamoxifen for total 5 years. -No need for adjuvant chemotherapy. -She is considering postop breast irradiation to reduce her cancer recurrence risks.  I'll plan to see her back in 3 weeks after her breast surgery, and finalize her adjuvant endocrine therapy. I will likely to start her on tamoxifen after she completes her breast radiation.  All  questions were answered. The patient knows to call the clinic with any problems, questions or concerns. I spent 30 minutes counseling the patient face to face. The total time spent in the appointment was 55 minutes and more than 50% was on counseling.     Truitt Merle, MD 07/13/2014 1:51 PM

## 2014-07-13 NOTE — Progress Notes (Signed)
Amanda Terrell is a very pleasant 58 y.o.Marland Kitchen female from [place], [place] with newly diagnosed grade 2-3 ductal carcinoma in situ of the left breast.  Biopsy results revealed the tumor's hormone status as ER positive and PR positive.  She presents today with her husband and daughter to the [facility]) for treatment consideration and recommendations from the breast surgeon, radiation oncologist, and medical oncologist.     I briefly met with Amanda Terrell and her family during her [facility] visit today. We discussed the purpose of the Survivorship Clinic, which will include monitoring for recurrence, coordinating completion of age and gender-appropriate cancer screenings, promotion of overall wellness, as well as managing potential late/long-term side effects of anti-cancer treatments.    As of today, the treatment plan for Amanda Terrell will likely include surgery and radiation therapy.  Anti-estrogen treatment will be considered for her as well. The intent of treatment for Amanda Terrell is cure, therefore she will be eligible for the Survivorship Clinic upon her completion of treatment.  Her survivorship care plan (SCP) document will be drafted and updated throughout the course of her treatment trajectory. She will receive the SCP in an office visit with myself in the Survivorship Clinic once she has completed treatment.   Amanda Terrell was encouraged to ask questions and all questions were answered to her satisfaction.  She was given my business card and encouraged to contact me with any concerns regarding survivorship.  I look forward to participating in her care.   Mike Craze, NP

## 2014-07-13 NOTE — Progress Notes (Signed)
Checked in new pt with no financial concerns at this time.  Informed pt that if chemo is part of her treatment I will call her ins to see if Josem Kaufmann is req and will obtain that if it is and will also contact different foundations that offer copay assistance if needed.  She has my card for any billing or insurance questions or concerns.

## 2014-07-15 ENCOUNTER — Other Ambulatory Visit (INDEPENDENT_AMBULATORY_CARE_PROVIDER_SITE_OTHER): Payer: Self-pay | Admitting: General Surgery

## 2014-07-15 DIAGNOSIS — C50912 Malignant neoplasm of unspecified site of left female breast: Secondary | ICD-10-CM

## 2014-07-18 ENCOUNTER — Telehealth: Payer: Self-pay | Admitting: *Deleted

## 2014-07-18 NOTE — Telephone Encounter (Signed)
Spoke with patient from [facility] 07/13/14.  She is doing well.  Confirmed follow up with Dr. Burr Medico for 08/26/14 at 10am.  Encouraged her to call with any needs or concerns.

## 2014-07-20 ENCOUNTER — Encounter: Payer: Self-pay | Admitting: General Practice

## 2014-07-20 NOTE — Progress Notes (Signed)
[facility] Psychosocial Distress Screening Spiritual Care  Visited with Ms Schreier, her husband, and dtr Loma Sousa at breast clinic to introduce [facility] team/resources and to review distress screen per protocol.  The patient scored a 5 on the Psychosocial Distress Thermometer which indicates moderate distress. Also assessed for distress and other psychosocial needs.   ONCBCN DISTRESS SCREENING 07/20/2014  Screening Type Initial Screening  Distress experienced in past week (1-10) 5  Emotional problem type Nervousness/Anxiety  Referral to support programs Yes  Other Spiritual Care   Ms Cadenhead reports fear of unknown as biggest stressor and notes now of her dx:  "I have a calmness about it."  Per pt, hx successful hepatitis C tx and a long hx of scoliosis and spinal surgeries are helping her keep perspective.  Additional coping tools include her faith, prayer, sense of divine intervention/providence, positive attitude related to maintaining perspective, sense of purpose, and past navigation of challenges.  She also reports support from her church community, including that her minister's wife had stage-IV cancer.  Provided empathic listening and pastoral reflection.  Family aware of ongoing chaplain/Support Ctr team availability and contact info, but please also page as needs arise:  [phone].  Thank you.  Follow up needed: No.   [place], [place]

## 2014-07-29 DIAGNOSIS — Z923 Personal history of irradiation: Secondary | ICD-10-CM

## 2014-07-29 HISTORY — DX: Personal history of irradiation: Z92.3

## 2014-08-01 ENCOUNTER — Encounter (HOSPITAL_BASED_OUTPATIENT_CLINIC_OR_DEPARTMENT_OTHER): Payer: Self-pay | Admitting: *Deleted

## 2014-08-02 ENCOUNTER — Other Ambulatory Visit: Payer: Self-pay

## 2014-08-02 ENCOUNTER — Encounter (HOSPITAL_BASED_OUTPATIENT_CLINIC_OR_DEPARTMENT_OTHER)
Admission: RE | Admit: 2014-08-02 | Discharge: 2014-08-02 | Disposition: A | Discharge status: Home / Self Care | Payer: BLUE CROSS/BLUE SHIELD | Source: Ambulatory Visit

## 2014-08-02 ENCOUNTER — Ambulatory Visit
Admission: RE | Admit: 2014-08-02 | Discharge: 2014-08-02 | Disposition: A | Discharge status: Home / Self Care | Payer: BLUE CROSS/BLUE SHIELD | Source: Ambulatory Visit | Attending: General Surgery | Admitting: General Surgery

## 2014-08-02 DIAGNOSIS — Z981 Arthrodesis status: Secondary | ICD-10-CM | POA: Diagnosis not present

## 2014-08-02 DIAGNOSIS — D0512 Intraductal carcinoma in situ of left breast: Secondary | ICD-10-CM | POA: Diagnosis present

## 2014-08-02 DIAGNOSIS — Z853 Personal history of malignant neoplasm of breast: Secondary | ICD-10-CM | POA: Diagnosis not present

## 2014-08-02 DIAGNOSIS — K219 Gastro-esophageal reflux disease without esophagitis: Secondary | ICD-10-CM | POA: Diagnosis not present

## 2014-08-02 DIAGNOSIS — B1920 Unspecified viral hepatitis C without hepatic coma: Secondary | ICD-10-CM | POA: Diagnosis not present

## 2014-08-02 DIAGNOSIS — M810 Age-related osteoporosis without current pathological fracture: Secondary | ICD-10-CM | POA: Diagnosis not present

## 2014-08-02 DIAGNOSIS — Z170 Estrogen receptor positive status [ER+]: Secondary | ICD-10-CM | POA: Diagnosis not present

## 2014-08-03 ENCOUNTER — Ambulatory Visit
Admission: RE | Admit: 2014-08-03 | Discharge: 2014-08-03 | Disposition: A | Discharge status: Home / Self Care | Payer: BLUE CROSS/BLUE SHIELD | Source: Ambulatory Visit | Attending: General Surgery | Admitting: General Surgery

## 2014-08-03 DIAGNOSIS — C50912 Malignant neoplasm of unspecified site of left female breast: Secondary | ICD-10-CM

## 2014-08-03 NOTE — H&P (Signed)
Amanda Terrell  Location: [facility] Patient #: [id number] DOB: April 25, 1956 Married / Language: English / Race: White Female      History of Present Illness     This is a 59 year old Caucasian female, referred by Corliss Blacker at the breast center of [facility] for evaluation and management of a focal area of DCIS, left breast, upper outer quadrant. Dr. [name] is her PCP. Dr. Forde Dandy is her gynecologist. She is being seen in the breast clinic today by Dr. Burr Medico, Dr. Pablo Ledger, and me.     She has had minor excisional biopsies of fibroadenomas, one from each breast, in the past. She recently felt a small lump in the upper outer quadrant of the left breast Mammograms and ultrasound show a 1.1 cm mass in the left breast at the 2:00 position, 6 cm from the nipple. Image guided biopsy shows ductal carcinoma in situ, receptor positive. MRI shows a solitary, 2 cm area in the upper outer quadrant of the left breast, deep.all nodes look normal.     She is interested in breast conservation surgery.     Comorbidities include multiple back surgeries for spinal fusion. Osteoporosis. History left axillary abscess last year. Remote history hepatitis C, treated, thought to be in remission, hyperlipidemia, GERD and esophageal stricture. 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 She is married. 3 children, all girls. No smoking.   Other Problems Arthritis Back Pain Bladder Problems Diverticulosis Gastric Ulcer Gastroesophageal Reflux Disease Heart murmur Hemorrhoids Hepatitis Lump In Breast Transfusion history  Past Surgical History Appendectomy Breast Biopsy Bilateral.  Cesarean Section - Multiple Hip Surgery Right. Oral Surgery Sentinel Lymph Node Biopsy Spinal Surgery - Lower Back Spinal Surgery Midback Tonsillectomy  Diagnostic Studies History  Colonoscopy 1-5 years  ago Mammogram within last year Pap Smear 1-5 years ago  Social History  Alcohol use Occasional alcohol use. Caffeine use Coffee, Tea. No drug use  Family History  Arthritis Father, Mother. Depression Son. Heart Disease Mother. Heart disease in female family member before age 74 Melanoma Family Members In General, Father. Migraine Headache Father. Thyroid problems Mother.  Pregnancy / Birth History  Age at menarche 53 years. Age of menopause 51-55 Contraceptive History Oral contraceptives. Gravida 4 Irregular periods Maternal age 34-25 Para 3  Review of Systems  General Present- Night Sweats and Weight Loss. Not Present- Appetite Loss, Chills, Fatigue, Fever and Weight Gain. Skin Present- Non-Healing Wounds. Not Present- Change in Wart/Mole, Dryness, Hives, Jaundice, New Lesions, Rash and Ulcer. HEENT Present- Hearing Loss and Wears glasses/contact lenses. Not Present- Earache, Hoarseness, Nose Bleed, Oral Ulcers, Ringing in the Ears, Seasonal Allergies, Sinus Pain, Sore Throat, Visual Disturbances and Yellow Eyes. Respiratory Present- Snoring. Not Present- Bloody sputum, Chronic Cough, Difficulty Breathing and Wheezing. Breast Present- Breast Mass. Not Present- Breast Pain, Nipple Discharge and Skin Changes. Cardiovascular Not Present- Chest Pain, Difficulty Breathing Lying Down, Leg Cramps, Palpitations, Rapid Heart Rate, Shortness of Breath and Swelling of Extremities. Gastrointestinal Present- Gets full quickly at meals, Hemorrhoids and Indigestion. Not Present- Abdominal Pain, Bloating, Bloody Stool, Change in Bowel Habits, Chronic diarrhea, Constipation, Difficulty Swallowing, Excessive gas, Nausea, Rectal Pain and Vomiting. Female Genitourinary Present- Frequency, Nocturia and Urgency. Not Present- Painful Urination and Pelvic Pain. Musculoskeletal Present- Back Pain, Joint Pain, Joint Stiffness, Muscle Pain, Muscle Weakness and Swelling of  Extremities. Neurological Present- Numbness, Tingling, Trouble walking and Weakness. Not Present- Decreased Memory, Fainting, Headaches, Seizures and  Tremor. Psychiatric Not Present- Anxiety, Bipolar, Change in Sleep Pattern, Depression, Fearful and Frequent crying. Endocrine Present- Hot flashes. Not Present- Cold Intolerance, Excessive Hunger, Hair Changes, Heat Intolerance and New Diabetes.   Physical Exam General Mental Status-Alert. General Appearance-Consistent with stated age. Hydration-Well hydrated. Voice-Normal.  Head and Neck Head-normocephalic, atraumatic with no lesions or palpable masses. Trachea-midline. Thyroid Gland Characteristics - normal size and consistency.  Eye Eyeball - Bilateral-Extraocular movements intact. Sclera/Conjunctiva - Bilateral-No scleral icterus.  Chest and Lung Exam Chest and lung exam reveals -quiet, even and easy respiratory effort with no use of accessory muscles and on auscultation, normal breath sounds, no adventitious sounds and normal vocal resonance. Inspection Chest Wall - Normal. Back - normal.  Breast Breast - Left-Symmetric and Biopsy scar, Non Tender, No Dimpling, No Inflammation, No Lumpectomy scars, No Mastectomy scars, No Peau d' Orange. Breast - Right-Symmetric and Biopsy scar, Non Tender, No Dimpling, No Inflammation, No Lumpectomy scars, No Mastectomy scars, No Peau d' Orange. Breast Lump-No Palpable Breast Mass. Note: There is an old biopsy scar in the right breast, 12:00 position. Another biopsy scar left breast, 4:00 position. There is fresh bruising in the left breast, upper outer quadrant and a little bit of thickening but no discrete mass. No other mass in either breast. No axillary adenopathy.   Cardiovascular Cardiovascular examination reveals -normal heart sounds, regular rate and rhythm with no murmurs and normal pedal pulses bilaterally.  Abdomen Inspection Inspection of the abdomen  reveals - No Hernias. Palpation/Percussion Palpation and Percussion of the abdomen reveal - Soft, Non Tender, No Rebound tenderness, No Rigidity (guarding) and No hepatosplenomegaly. Auscultation Auscultation of the abdomen reveals - Bowel sounds normal. Note: well-healed Pfannenstiel incision from cesarean section. well-healed lengthy left flank incision from spine surgery.   Neurologic Neurologic evaluation reveals -alert and oriented x 3 with no impairment of recent or remote memory. Mental Status-Normal.  Musculoskeletal Normal Exam - Left-Upper Extremity Strength Normal and Lower Extremity Strength Normal. Normal Exam - Right-Upper Extremity Strength Normal and Lower Extremity Strength Normal. Note: Significant scoliosis. Healed scars mid back are multiple instrumentations. She walks with a cane and has a gait disorder.   Lymphatic Head & Neck  General Head & Neck Lymphatics: Bilateral - Description - Normal. Axillary  General Axillary Region: Bilateral - Description - Normal. Tenderness - Non Tender. Femoral & Inguinal  Generalized Femoral & Inguinal Lymphatics: Bilateral - Description - Normal. Tenderness - Non Tender.    Assessment & Plan CARCINOMA IN SITU OF FEMALE BREAST, LEFT (233.0  D05.92) Current Plans  Schedule for Surgery you have been diagnosed with ductal carcinoma in situ of the left breast, upper outer quadrant. We have discussed numerous options, and you have decided to proceed with left partial mastectomy with radioactive seed locallization. my officeocess call you tomorrow to begin the scheduling process We have discussed the techniques and risk of the surgery in detail. The other treating physicians will discuss other aspects of your care with you.  HISTORY OF HEPATITIS C (V12.09  Z86.19) Impression: in remission by history. Followed by Dr. Delfin Edis. HISTORY OF SPINAL FUSION FOR SCOLIOSIS (V45.4  Z98.1) CHRONIC GERD (530.81   K21.9)   Allyne Hebert M. Dalbert Batman, M.D., [facility] General and Minimally invasive Surgery Breast and Colorectal Surgery Office:   [phone] Pager:   [phone]

## 2014-08-05 ENCOUNTER — Ambulatory Visit (HOSPITAL_BASED_OUTPATIENT_CLINIC_OR_DEPARTMENT_OTHER): Payer: BLUE CROSS/BLUE SHIELD | Admitting: Anesthesiology

## 2014-08-05 ENCOUNTER — Ambulatory Visit
Admission: RE | Admit: 2014-08-05 | Discharge: 2014-08-05 | Disposition: A | Discharge status: Home / Self Care | Payer: BLUE CROSS/BLUE SHIELD | Source: Ambulatory Visit | Attending: General Surgery | Admitting: General Surgery

## 2014-08-05 ENCOUNTER — Ambulatory Visit (HOSPITAL_BASED_OUTPATIENT_CLINIC_OR_DEPARTMENT_OTHER)
Admission: RE | Admit: 2014-08-05 | Discharge: 2014-08-05 | Disposition: A | Discharge status: Home / Self Care | Payer: BLUE CROSS/BLUE SHIELD | Source: Ambulatory Visit | Attending: General Surgery | Admitting: General Surgery

## 2014-08-05 ENCOUNTER — Encounter (HOSPITAL_BASED_OUTPATIENT_CLINIC_OR_DEPARTMENT_OTHER): Payer: Self-pay | Admitting: *Deleted

## 2014-08-05 ENCOUNTER — Encounter (HOSPITAL_BASED_OUTPATIENT_CLINIC_OR_DEPARTMENT_OTHER)
Admission: RE | Disposition: A | Discharge status: Home / Self Care | Payer: Self-pay | Source: Ambulatory Visit | Attending: General Surgery

## 2014-08-05 DIAGNOSIS — D0512 Intraductal carcinoma in situ of left breast: Secondary | ICD-10-CM | POA: Insufficient documentation

## 2014-08-05 DIAGNOSIS — C50912 Malignant neoplasm of unspecified site of left female breast: Secondary | ICD-10-CM

## 2014-08-05 DIAGNOSIS — Z981 Arthrodesis status: Secondary | ICD-10-CM | POA: Insufficient documentation

## 2014-08-05 DIAGNOSIS — M810 Age-related osteoporosis without current pathological fracture: Secondary | ICD-10-CM | POA: Insufficient documentation

## 2014-08-05 DIAGNOSIS — K219 Gastro-esophageal reflux disease without esophagitis: Secondary | ICD-10-CM | POA: Insufficient documentation

## 2014-08-05 DIAGNOSIS — C50412 Malignant neoplasm of upper-outer quadrant of left female breast: Secondary | ICD-10-CM | POA: Diagnosis present

## 2014-08-05 DIAGNOSIS — Z853 Personal history of malignant neoplasm of breast: Secondary | ICD-10-CM | POA: Insufficient documentation

## 2014-08-05 DIAGNOSIS — B1920 Unspecified viral hepatitis C without hepatic coma: Secondary | ICD-10-CM | POA: Insufficient documentation

## 2014-08-05 DIAGNOSIS — Z01818 Encounter for other preprocedural examination: Secondary | ICD-10-CM

## 2014-08-05 DIAGNOSIS — Z170 Estrogen receptor positive status [ER+]: Secondary | ICD-10-CM | POA: Insufficient documentation

## 2014-08-05 HISTORY — DX: Nausea with vomiting, unspecified: R11.2

## 2014-08-05 HISTORY — DX: Meningitis, unspecified: G03.9

## 2014-08-05 HISTORY — PX: BREAST LUMPECTOMY WITH RADIOACTIVE SEED LOCALIZATION: SHX6424

## 2014-08-05 HISTORY — DX: Other specified postprocedural states: Z98.890

## 2014-08-05 SURGERY — BREAST LUMPECTOMY WITH RADIOACTIVE SEED LOCALIZATION
Anesthesia: General | Site: Breast | Laterality: Left

## 2014-08-05 MED ORDER — LIDOCAINE HCL (CARDIAC) 20 MG/ML IV SOLN
INTRAVENOUS | Status: DC | PRN
  Administered 2014-08-05: 75 mg via INTRAVENOUS

## 2014-08-05 MED ORDER — PROPOFOL 10 MG/ML IV BOLUS
INTRAVENOUS | Status: DC | PRN
  Administered 2014-08-05: 200 mg via INTRAVENOUS

## 2014-08-05 MED ORDER — CHLORHEXIDINE GLUCONATE 4 % EX LIQD
1.0000 | Freq: Once | CUTANEOUS | Status: DC
Start: 2014-08-06 — End: 2014-08-05

## 2014-08-05 MED ORDER — DEXAMETHASONE SODIUM PHOSPHATE 4 MG/ML IJ SOLN
INTRAMUSCULAR | Status: DC | PRN
  Administered 2014-08-05: 10 mg via INTRAVENOUS

## 2014-08-05 MED ORDER — MIDAZOLAM HCL 2 MG/2ML IJ SOLN: 1.0000 mg | INTRAMUSCULAR | Status: DC | PRN

## 2014-08-05 MED ORDER — OXYCODONE HCL 5 MG/5ML PO SOLN: 5.0000 mg | Freq: Once | ORAL | Status: AC | PRN

## 2014-08-05 MED ORDER — CEFAZOLIN SODIUM-DEXTROSE 2-3 GM-% IV SOLR
2.0000 g | INTRAVENOUS | Status: AC
  Administered 2014-08-05: 2 g via INTRAVENOUS

## 2014-08-05 MED ORDER — OXYCODONE HCL 5 MG PO TABS
ORAL_TABLET | ORAL | Status: AC
  Filled 2014-08-05: qty 1

## 2014-08-05 MED ORDER — HYDROMORPHONE HCL 1 MG/ML IJ SOLN
INTRAMUSCULAR | Status: AC
  Filled 2014-08-05: qty 1

## 2014-08-05 MED ORDER — ONDANSETRON HCL 4 MG/2ML IJ SOLN
INTRAMUSCULAR | Status: DC | PRN
  Administered 2014-08-05: 4 mg via INTRAVENOUS

## 2014-08-05 MED ORDER — MIDAZOLAM HCL 2 MG/2ML IJ SOLN: 2.0000 mg | Freq: Once | INTRAMUSCULAR | Status: DC

## 2014-08-05 MED ORDER — ONDANSETRON HCL 4 MG/2ML IJ SOLN: 4.0000 mg | Freq: Once | INTRAMUSCULAR | Status: DC | PRN

## 2014-08-05 MED ORDER — OXYCODONE HCL 5 MG PO TABS
5.0000 mg | ORAL_TABLET | Freq: Once | ORAL | Status: AC | PRN
Start: 2014-08-05 — End: 2014-08-05
  Administered 2014-08-05: 5 mg via ORAL

## 2014-08-05 MED ORDER — FENTANYL CITRATE 0.05 MG/ML IJ SOLN: 50.0000 ug | INTRAMUSCULAR | Status: DC | PRN

## 2014-08-05 MED ORDER — MIDAZOLAM HCL 5 MG/5ML IJ SOLN
INTRAMUSCULAR | Status: DC | PRN
  Administered 2014-08-05: 2 mg via INTRAVENOUS

## 2014-08-05 MED ORDER — HYDROMORPHONE HCL 1 MG/ML IJ SOLN
0.2500 mg | INTRAMUSCULAR | Status: DC | PRN
  Administered 2014-08-05 (×2): 0.5 mg via INTRAVENOUS

## 2014-08-05 MED ORDER — FENTANYL CITRATE 0.05 MG/ML IJ SOLN
INTRAMUSCULAR | Status: AC
  Filled 2014-08-05: qty 6

## 2014-08-05 MED ORDER — FENTANYL CITRATE 0.05 MG/ML IJ SOLN
INTRAMUSCULAR | Status: DC | PRN
  Administered 2014-08-05: 100 ug via INTRAVENOUS

## 2014-08-05 MED ORDER — HYDROCODONE-ACETAMINOPHEN 5-325 MG PO TABS: 1.0000 | ORAL_TABLET | Freq: Four times a day (QID) | ORAL | Status: DC | PRN

## 2014-08-05 MED ORDER — BUPIVACAINE-EPINEPHRINE (PF) 0.5% -1:200000 IJ SOLN
INTRAMUSCULAR | Status: DC | PRN
  Administered 2014-08-05: 10 mL

## 2014-08-05 MED ORDER — ACETAMINOPHEN 325 MG PO TABS: 325.0000 mg | ORAL_TABLET | ORAL | Status: DC | PRN

## 2014-08-05 MED ORDER — LACTATED RINGERS IV SOLN
INTRAVENOUS | Status: DC
  Administered 2014-08-05 (×2): via INTRAVENOUS

## 2014-08-05 MED ORDER — ACETAMINOPHEN 160 MG/5ML PO SOLN: 325.0000 mg | ORAL | Status: DC | PRN

## 2014-08-05 MED ORDER — CEFAZOLIN SODIUM-DEXTROSE 2-3 GM-% IV SOLR
INTRAVENOUS | Status: AC
  Filled 2014-08-05: qty 50

## 2014-08-05 MED ORDER — MIDAZOLAM HCL 2 MG/2ML IJ SOLN
INTRAMUSCULAR | Status: AC
  Filled 2014-08-05: qty 2

## 2014-08-05 MED ORDER — BUPIVACAINE-EPINEPHRINE (PF) 0.5% -1:200000 IJ SOLN
INTRAMUSCULAR | Status: AC
  Filled 2014-08-05: qty 30

## 2014-08-05 MED ORDER — PHENYLEPHRINE HCL 10 MG/ML IJ SOLN
INTRAMUSCULAR | Status: DC | PRN
  Administered 2014-08-05: 40 ug via INTRAVENOUS

## 2014-08-05 SURGICAL SUPPLY — 65 items
APL SKNCLS STERI-STRIP NONHPOA (GAUZE/BANDAGES/DRESSINGS)
APPLIER CLIP 9.375 MED OPEN (MISCELLANEOUS) ×3
APR CLP MED 9.3 20 MLT OPN (MISCELLANEOUS) ×1
BENZOIN TINCTURE PRP APPL 2/3 (GAUZE/BANDAGES/DRESSINGS) IMPLANT
BINDER BREAST LRG (GAUZE/BANDAGES/DRESSINGS) IMPLANT
BINDER BREAST MEDIUM (GAUZE/BANDAGES/DRESSINGS) ×2 IMPLANT
BINDER BREAST XLRG (GAUZE/BANDAGES/DRESSINGS) IMPLANT
BINDER BREAST XXLRG (GAUZE/BANDAGES/DRESSINGS) IMPLANT
BLADE HEX COATED 2.75 (ELECTRODE) ×3 IMPLANT
BLADE SURG 10 STRL SS (BLADE) IMPLANT
BLADE SURG 15 STRL LF DISP TIS (BLADE) ×1 IMPLANT
BLADE SURG 15 STRL SS (BLADE) ×3
CANISTER SUC SOCK COL 7IN (MISCELLANEOUS) ×1 IMPLANT
CANISTER SUCT 1200ML W/VALVE (MISCELLANEOUS) ×3 IMPLANT
CHLORAPREP W/TINT 26ML (MISCELLANEOUS) ×3 IMPLANT
CLIP APPLIE 9.375 MED OPEN (MISCELLANEOUS) IMPLANT
CLOSURE WOUND 1/2 X4 (GAUZE/BANDAGES/DRESSINGS)
COVER BACK TABLE 60X90IN (DRAPES) ×3 IMPLANT
COVER MAYO STAND STRL (DRAPES) ×3 IMPLANT
COVER PROBE W GEL 5X96 (DRAPES) ×3 IMPLANT
DECANTER SPIKE VIAL GLASS SM (MISCELLANEOUS) IMPLANT
DEVICE DUBIN W/COMP PLATE 8390 (MISCELLANEOUS) ×3 IMPLANT
DRAIN CHANNEL 19F RND (DRAIN) IMPLANT
DRAPE LAPAROSCOPIC ABDOMINAL (DRAPES) ×3 IMPLANT
DRAPE UTILITY XL STRL (DRAPES) ×3 IMPLANT
DRSG PAD ABDOMINAL 8X10 ST (GAUZE/BANDAGES/DRESSINGS) IMPLANT
ELECT REM PT RETURN 9FT ADLT (ELECTROSURGICAL) ×3
ELECTRODE REM PT RTRN 9FT ADLT (ELECTROSURGICAL) ×1 IMPLANT
EVACUATOR SILICONE 100CC (DRAIN) IMPLANT
GLOVE BIOGEL PI IND STRL 7.0 (GLOVE) IMPLANT
GLOVE BIOGEL PI INDICATOR 7.0 (GLOVE) ×2
GLOVE ECLIPSE 6.5 STRL STRAW (GLOVE) ×4 IMPLANT
GLOVE EUDERMIC 7 POWDERFREE (GLOVE) ×5 IMPLANT
GOWN STRL REUS W/ TWL LRG LVL3 (GOWN DISPOSABLE) ×1 IMPLANT
GOWN STRL REUS W/ TWL XL LVL3 (GOWN DISPOSABLE) ×1 IMPLANT
GOWN STRL REUS W/TWL LRG LVL3 (GOWN DISPOSABLE) ×3
GOWN STRL REUS W/TWL XL LVL3 (GOWN DISPOSABLE) ×3
KIT MARKER MARGIN INK (KITS) ×2 IMPLANT
LIQUID BAND (GAUZE/BANDAGES/DRESSINGS) ×3 IMPLANT
NDL HYPO 25X1 1.5 SAFETY (NEEDLE) IMPLANT
NEEDLE HYPO 25X1 1.5 SAFETY (NEEDLE) ×3 IMPLANT
NS IRRIG 1000ML POUR BTL (IV SOLUTION) ×3 IMPLANT
PACK BASIN DAY SURGERY FS (CUSTOM PROCEDURE TRAY) ×3 IMPLANT
PENCIL BUTTON HOLSTER BLD 10FT (ELECTRODE) ×3 IMPLANT
PIN SAFETY STERILE (MISCELLANEOUS) ×1 IMPLANT
SHEET MEDIUM DRAPE 40X70 STRL (DRAPES) IMPLANT
SLEEVE SCD COMPRESS KNEE MED (MISCELLANEOUS) ×3 IMPLANT
SPONGE GAUZE 4X4 12PLY STER LF (GAUZE/BANDAGES/DRESSINGS) IMPLANT
SPONGE LAP 18X18 X RAY DECT (DISPOSABLE) IMPLANT
SPONGE LAP 4X18 X RAY DECT (DISPOSABLE) ×3 IMPLANT
STRIP CLOSURE SKIN 1/2X4 (GAUZE/BANDAGES/DRESSINGS) IMPLANT
SUT ETHILON 3 0 FSL (SUTURE) IMPLANT
SUT MNCRL AB 4-0 PS2 18 (SUTURE) ×3 IMPLANT
SUT SILK 2 0 SH (SUTURE) ×3 IMPLANT
SUT VIC AB 2-0 CT1 27 (SUTURE)
SUT VIC AB 2-0 CT1 TAPERPNT 27 (SUTURE) IMPLANT
SUT VIC AB 3-0 SH 27 (SUTURE)
SUT VIC AB 3-0 SH 27X BRD (SUTURE) IMPLANT
SUT VICRYL 3-0 CR8 SH (SUTURE) ×3 IMPLANT
SYRINGE 10CC LL (SYRINGE) ×2 IMPLANT
TOWEL OR 17X24 6PK STRL BLUE (TOWEL DISPOSABLE) ×3 IMPLANT
TOWEL OR NON WOVEN STRL DISP B (DISPOSABLE) ×1 IMPLANT
TUBE CONNECTING 20'X1/4 (TUBING) ×1
TUBE CONNECTING 20X1/4 (TUBING) ×2 IMPLANT
YANKAUER SUCT BULB TIP NO VENT (SUCTIONS) ×3 IMPLANT

## 2014-08-05 NOTE — Anesthesia Postprocedure Evaluation (Signed)
  Anesthesia Post-op Note  Patient: Amanda Terrell  Procedure(s) Performed: Procedure(s): LEFT PARTICAL MASTECTOMY WITH RADIOACTIVE SEED LOCALIZATION (Left)  Patient Location: PACU  Anesthesia Type:General  Level of Consciousness: awake, alert  and oriented  Airway and Oxygen Therapy: Patient Spontanous Breathing and Patient connected to nasal cannula oxygen  Post-op Pain: mild  Post-op Assessment: Post-op Vital signs reviewed, Patient's Cardiovascular Status Stable, Respiratory Function Stable, RESPIRATORY FUNCTION UNSTABLE and Pain level controlled  Post-op Vital Signs: stable  Last Vitals:  Filed Vitals:   08/05/14 1515  BP: 129/78  Pulse: 88  Temp: 36.5 C  Resp: 16    Complications: No apparent anesthesia complications

## 2014-08-05 NOTE — Discharge Instructions (Signed)
[place] Office Phone Number [phone]  BREAST BIOPSY/ PARTIAL MASTECTOMY: POST OP INSTRUCTIONS  Always review your discharge instruction sheet given to you by the facility where your surgery was performed.  IF YOU HAVE DISABILITY OR FAMILY LEAVE FORMS, YOU MUST BRING THEM TO THE OFFICE FOR PROCESSING.  DO NOT GIVE THEM TO YOUR DOCTOR.  1. A prescription for pain medication may be given to you upon discharge.  Take your pain medication as prescribed, if needed.  If narcotic pain medicine is not needed, then you may take acetaminophen (Tylenol) or ibuprofen (Advil) as needed. 2. Take your usually prescribed medications unless otherwise directed 3. If you need a refill on your pain medication, please contact your pharmacy.  They will contact our office to request authorization.  Prescriptions will not be filled after 5pm or on week-ends. 4. You should eat very light the first 24 hours after surgery, such as soup, crackers, pudding, etc.  Resume your normal diet the day after surgery. 5. Most patients will experience some swelling and bruising in the breast.  Ice packs and a good support bra will help.  Swelling and bruising can take several days to resolve.  6. It is common to experience some constipation if taking pain medication after surgery.  Increasing fluid intake and taking a stool softener will usually help or prevent this problem from occurring.  A mild laxative (Milk of Magnesia or Miralax) should be taken according to package directions if there are no bowel movements after 48 hours. 7. Unless discharge instructions indicate otherwise, you may remove your bandages 24-48 hours after surgery, and you may shower at that time.  You may have steri-strips (small skin tapes) in place directly over the incision.  These strips should be left on the skin for 7-10 days.  If your surgeon used skin glue on the incision, you may shower in 24 hours.  The glue will flake off over the  next 2-3 weeks.  Any sutures or staples will be removed at the office during your follow-up visit. 8. ACTIVITIES:  You may resume regular daily activities (gradually increasing) beginning the next day.  Wearing a good support bra or sports bra minimizes pain and swelling.  You may have sexual intercourse when it is comfortable. a. You may drive when you no longer are taking prescription pain medication, you can comfortably wear a seatbelt, and you can safely maneuver your car and apply brakes. b. RETURN TO WORK:  ______________________________________________________________________________________ 9. You should see your doctor in the office for a follow-up appointment approximately two weeks after your surgery.  Your doctors nurse will typically make your follow-up appointment when she calls you with your pathology report.  Expect your pathology report 2-3 business days after your surgery.  You may call to check if you do not hear from [place] after three days. 10. OTHER INSTRUCTIONS: _______________________________________________________________________________________________ _____________________________________________________________________________________________________________________________________ _____________________________________________________________________________________________________________________________________ _____________________________________________________________________________________________________________________________________  WHEN TO CALL YOUR DOCTOR: 1. Fever over 101.0 2. Nausea and/or vomiting. 3. Extreme swelling or bruising. 4. Continued bleeding from incision. 5. Increased pain, redness, or drainage from the incision.  The clinic staff is available to answer your questions during regular business hours.  Please dont hesitate to call and ask to speak to one of the nurses for clinical concerns.  If you have a medical emergency, go to the nearest  emergency room or call 911.  A surgeon from [facility] is always on call at the hospital.  For further questions, please visit [email or web address] °Office Phone Number [phone] ° °BREAST BIOPSY/ PARTIAL MASTECTOMY: POST OP INSTRUCTIONS ° °Always review your discharge instruction sheet given to you by the facility where your surgery was performed. ° °IF YOU HAVE DISABILITY OR FAMILY LEAVE FORMS, YOU MUST BRING THEM TO THE OFFICE FOR PROCESSING.  DO NOT GIVE THEM TO YOUR DOCTOR. ° °11. A prescription for pain medication may be given to you upon discharge.  Take your pain medication as prescribed, if needed.  If narcotic pain medicine is not needed, then you may take acetaminophen (Tylenol) or ibuprofen (Advil) as needed. °12. Take your usually prescribed medications unless otherwise directed °13. If you need a refill on your pain medication, please contact your pharmacy.  They will contact our office to request authorization.  Prescriptions will not be filled after 5pm or on week-ends. °14. You should eat very light the first 24 hours after surgery, such as soup, crackers, pudding, etc.  Resume your normal diet the day after surgery. °15. Most patients will experience some swelling and bruising in the breast.  Ice packs and a good support bra will help.  Swelling and bruising can take several days to resolve.  °16. It is common to experience some constipation if taking pain medication after surgery.  Increasing fluid intake and taking a stool softener will usually help or prevent this problem from occurring.  A mild laxative (Milk of Magnesia or Miralax) should be taken according to package directions if there are no bowel movements after 48 hours. °17. Unless discharge instructions indicate otherwise, you may remove your bandages 24-48 hours after surgery, and you may shower at that time.  You may have steri-strips (small skin tapes) in place directly over the  incision.  These strips should be left on the skin for 7-10 days.  If your surgeon used skin glue on the incision, you may shower in 24 hours.  The glue will flake off over the next 2-3 weeks.  Any sutures or staples will be removed at the office during your follow-up visit. °18. ACTIVITIES:  You may resume regular daily activities (gradually increasing) beginning the next day.  Wearing a good support bra or sports bra minimizes pain and swelling.  You may have sexual intercourse when it is comfortable. °a. You may drive when you no longer are taking prescription pain medication, you can comfortably wear a seatbelt, and you can safely maneuver your car and apply brakes. °b. RETURN TO WORK:  ______________________________________________________________________________________ °19. You should see your doctor in the office for a follow-up appointment approximately two weeks after your surgery.  Your doctor’s nurse will typically make your follow-up appointment when she calls you with your pathology report.  Expect your pathology report 2-3 business days after your surgery.  You may call to check if you do not hear from us after three days. °20. OTHER INSTRUCTIONS: _______________________________________________________________________________________________ _____________________________________________________________________________________________________________________________________ °_____________________________________________________________________________________________________________________________________ °_____________________________________________________________________________________________________________________________________ ° °WHEN TO CALL YOUR DOCTOR: °6. Fever over 101.0 °7. Nausea and/or vomiting. °8. Extreme swelling or bruising. °9. Continued bleeding from incision. °10. Increased pain, redness, or drainage from the incision. ° °The clinic staff is available to answer your questions  during regular business hours.  Please don’t hesitate to call and ask to speak to one of the nurses for clinical concerns.  If you have a medical emergency, go to the nearest emergency room or call 911.  A [facility] from [facility] is always on call at the hospital. ° °For further questions, please visit [email or web address]  ° ° °  Post Anesthesia Home Care Instructions ° °Activity: °Get plenty of rest for the remainder of the day. A responsible adult should stay with you for 24 hours following the procedure.  °For the next 24 hours, DO NOT: °-Drive a car °-Operate machinery °-Drink alcoholic beverages °-Take any medication unless instructed by your physician °-Make any legal decisions or sign important papers. ° °Meals: °Start with liquid foods such as gelatin or soup. Progress to regular foods as tolerated. Avoid greasy, spicy, heavy foods. If nausea and/or vomiting occur, drink only clear liquids until the nausea and/or vomiting subsides. Call your physician if vomiting continues. ° °Special Instructions/Symptoms: °Your throat may feel dry or sore from the anesthesia or the breathing tube placed in your throat during surgery. If this causes discomfort, gargle with warm salt water. The discomfort should disappear within 24 hours. ° °

## 2014-08-05 NOTE — Transfer of Care (Signed)
Immediate Anesthesia Transfer of Care Note  Patient: Amanda Terrell  Procedure(s) Performed: Procedure(s): LEFT PARTICAL MASTECTOMY WITH RADIOACTIVE SEED LOCALIZATION (Left)  Patient Location: PACU  Anesthesia Type:General  Level of Consciousness: awake, alert  and oriented  Airway & Oxygen Therapy: Patient Spontanous Breathing and Patient connected to face mask oxygen  Post-op Assessment: Report given to PACU RN and Post -op Vital signs reviewed and stable  Post vital signs: Reviewed and stable  Complications: No apparent anesthesia complications

## 2014-08-05 NOTE — Anesthesia Procedure Notes (Signed)
Procedure Name: LMA Insertion Date/Time: 08/05/2014 12:59 PM Performed by: Melynda Ripple D Pre-anesthesia Checklist: Patient identified, Emergency Drugs available, Suction available and Patient being monitored Patient Re-evaluated:Patient Re-evaluated prior to inductionOxygen Delivery Method: Circle System Utilized Preoxygenation: Pre-oxygenation with 100% oxygen Intubation Type: IV induction Ventilation: Mask ventilation without difficulty LMA: LMA inserted LMA Size: 4.0 Number of attempts: 1 Airway Equipment and Method: bite block Placement Confirmation: positive ETCO2 Tube secured with: Tape Dental Injury: Teeth and Oropharynx as per pre-operative assessment

## 2014-08-05 NOTE — Anesthesia Preprocedure Evaluation (Signed)
Anesthesia Evaluation  Patient identified by MRN, date of birth, ID band Patient awake    Reviewed: Allergy & Precautions, NPO status , Patient's Chart, lab work & pertinent test results  Airway Mallampati: II  TM Distance: >3 FB Neck ROM: Full    Dental  (+) Teeth Intact, Dental Advisory Given   Pulmonary  breath sounds clear to auscultation        Cardiovascular Rhythm:Regular Rate:Normal     Neuro/Psych    GI/Hepatic   Endo/Other    Renal/GU      Musculoskeletal   Abdominal   Peds  Hematology   Anesthesia Other Findings   Reproductive/Obstetrics                             Anesthesia Physical Anesthesia Plan  ASA: II  Anesthesia Plan: General   Post-op Pain Management:    Induction: Intravenous  Airway Management Planned: LMA  Additional Equipment:   Intra-op Plan:   Post-operative Plan:   Informed Consent: I have reviewed the patients History and Physical, chart, labs and discussed the procedure including the risks, benefits and alternatives for the proposed anesthesia with the patient or authorized representative who has indicated his/her understanding and acceptance.   Dental advisory given  Plan Discussed with: CRNA and Anesthesiologist  Anesthesia Plan Comments: (L. Breast DCIS Firomyalgia GERD  Plan GA with LMA  Amanda Terrell )        Anesthesia Quick Evaluation

## 2014-08-05 NOTE — Op Note (Signed)
Patient Name:           Amanda Terrell   Date of Surgery:        08/05/2014  Pre op Diagnosis:      Ductal carcinoma in situ left breast, upper outer quadrant, receptor positive  Post op Diagnosis:    Same  Procedure:                 Left partial mastectomy with radioactive seed localization and margin assessment  Surgeon:                     Edsel Petrin. Dalbert Batman, M.D., FACS  Assistant:                      OR staff  Operative Indications:   This is a 59 year old Caucasian female, referred by Corliss Blacker at the breast center of [facility] for evaluation and management of a focal area of DCIS, left breast, upper outer quadrant. Dr. [name] is her PCP. Dr. Forde Dandy is her gynecologist. She was seen in the breast clinic recently by Dr. Burr Medico, Dr. Pablo Ledger, and me.  She has had minor excisional biopsies of fibroadenomas, one from each breast, in the past. She recently felt a small lump in the upper outer quadrant of the left breast Mammograms and ultrasound show a 1.1 cm mass in the left breast at the 2:00 position, 6 cm from the nipple. Image guided biopsy shows ductal carcinoma in situ, receptor positive. MRI shows a solitary, 2 cm area in the upper outer quadrant of the left breast,.all nodes look normal.  She is interested in breast conservation surgery. Family history is negative for breast cancer or ovarian cancer. Marland Kitchen  Operative Findings:       Her breast is very small and thin. I basically took the breast tissue out from just under the dermis all the way down to the pectoralis fascia and including the pectoralis fascia high in the upper outer quadrant. The specimen mammogram shows that the marker clip and radioactive seed were present within the specimen.  Procedure in Detail:          The patient underwent radioactive seed placement recently. In the holding area I could identify the radioactivity in the upper outer quadrant of the left breast. The patient was taken the operating  room and underwent general anesthesia. Intravenous antibiotics were given. Surgical timeout was performed. The left breast was prepped and draped in a sterile fashion. Using the neoprobe  I identified the area of maximum radioactivity high in the upper outer quadrant. 0.5% Marcaine with epinephrine was used as a local infiltration anesthetic. A curvilinear incision was made in this area and dissection was carried down and widely around the radioactivity using the neoprobe frequently. I removed a moderately large piece of tissue because the MRI suggested this was a 2 cm area. The specimen was removed and marked with sutures and multicolored ink. Specimen mammogram looked good as described above. The specimen was sent to the lab. Hemostasis was excellent and achieved with electrocautery. The wound was irrigated with saline. Lumpectomy cavity was marked with metal clips. The breast tissues were reapproximated with interrupted sutures of 3-0 Vicryl and the skin closed with a running subcuticular suture of 4-0 Monocryl and Dermabond. Breast binder was placed and patient taken to PACU in stable condition. EBL 15 mL. Counts correct. Complications none.     Edsel Petrin. Dalbert Batman, M.D., FACS General and Minimally Invasive  Surgery Breast and Colorectal Surgery  08/05/2014 1:41 PM

## 2014-08-05 NOTE — Interval H&P Note (Signed)
History and Physical Interval Note:  08/05/2014 10:51 AM  Amanda Terrell  has presented today for surgery, with the diagnosis of ductal carcinoma in situ left breast , upper outer quadrant  The various methods of treatment have been discussed with the patient and family. After consideration of risks, benefits and other options for treatment, the patient has consented to  Procedure(s): LEFT PARTICAL MASTECTOMY WITH RADIOACTIVE SEED LOCALIZATION (Left) as a surgical intervention .  The patient's history has been reviewed, patient examined today, no change in status, stable for surgery.  I have reviewed the patient's chart and labs.  Questions were answered to the patient's satisfaction.     Adin Hector

## 2014-08-08 ENCOUNTER — Encounter (HOSPITAL_BASED_OUTPATIENT_CLINIC_OR_DEPARTMENT_OTHER): Payer: Self-pay | Admitting: General Surgery

## 2014-08-08 NOTE — Progress Notes (Signed)
Quick Note:  Inform patient of Pathology report,. Tell her that the final pathology report shows ductal carcinoma in situ. Margins negative. No evidence of invasive disease. This is good news and she will not need any further surgery. I will discuss this with her in detail at her next office visit. Make sure she has referral to medical oncology and radiation oncology.  hmi ______

## 2014-08-12 ENCOUNTER — Encounter: Payer: Self-pay | Admitting: Radiation Oncology

## 2014-08-12 NOTE — Progress Notes (Signed)
Location of Breast Cancer:Left Breast upper-outer quadrant   Histology per Pathology Report: 08/05/2014 FINAL DIAGNOSIS Diagnosis Breast, partial mastectomy, Left - DUCTAL CARCINOMA IN SITU WITH CALCIFICATIONS, INTERMEDIATE GRADE, SPANNING 2.2 CM. - ONE BENIGN INTRAMAMMARY LYMPH NODE (0/1). - THE SURGICAL RESECTION MARGINS ARE NEGATIVE FOR DUCTAL CARCINOMA. - SEE ONCOLOGY TABLE BELOW.  Receptor Status: ER(+), PR (+), Her2-neu ()  Did patient present with symptoms (if so, please note symptoms) or was this found on screening mammography?:patient palpated pea-sized mass while lying in bed.  Past/Anticipated interventions by surgeon, if any:08/05/14 BREAST LUMPECTOMY WITH RADIOACTIVE SEED LOCALIZATION  Past/Anticipated interventions by medical oncology, if any: Chemotherapy  Lymphedema issues, if any:  No  Pain issues, if any:no breast pain, chronic back pain.   SAFETY ISSUES:  Prior radiation? No  Pacemaker/ICD? No  Possible current pregnancy?No  Is the patient on methotrexate? NO  Current Complaints / other details: Married.Menses age 27.GXP3.First child age 65.states she used premarin cream a few times. No smoking history    Arlyss Repress, RN 08/12/2014,9:29 AM

## 2014-08-18 ENCOUNTER — Ambulatory Visit
Admission: RE | Admit: 2014-08-18 | Discharge: 2014-08-18 | Disposition: A | Discharge status: Home / Self Care | Payer: BLUE CROSS/BLUE SHIELD | Source: Ambulatory Visit | Attending: Radiation Oncology | Admitting: Radiation Oncology

## 2014-08-18 ENCOUNTER — Encounter: Payer: Self-pay | Admitting: Radiation Oncology

## 2014-08-18 ENCOUNTER — Telehealth: Payer: Self-pay | Admitting: *Deleted

## 2014-08-18 VITALS — BP 134/77 | HR 67 | Temp 97.6°F | Wt 140.1 lb

## 2014-08-18 DIAGNOSIS — Z170 Estrogen receptor positive status [ER+]: Secondary | ICD-10-CM | POA: Diagnosis not present

## 2014-08-18 DIAGNOSIS — Z510 Encounter for antineoplastic radiation therapy: Secondary | ICD-10-CM | POA: Diagnosis not present

## 2014-08-18 DIAGNOSIS — D0592 Unspecified type of carcinoma in situ of left breast: Secondary | ICD-10-CM | POA: Diagnosis not present

## 2014-08-18 DIAGNOSIS — C50412 Malignant neoplasm of upper-outer quadrant of left female breast: Secondary | ICD-10-CM

## 2014-08-18 NOTE — Telephone Encounter (Signed)
CALLED PATIENT TO INFORM OF AN APPT. WITH DR. Whitney Muse ON 09-01-14 - ARRIVAL TIME - 3 PM- PATIENT TO REPORT TO [place], PATIENT TO BRING LIST OF MEDS. AND AN INSURANCE CARD, PHONE NUMBER [phone], LVM FOR A RETURN CALL

## 2014-08-18 NOTE — Addendum Note (Signed)
Encounter addended by: Arlyss Repress, RN on: 08/18/2014 11:54 AM<BR>     Documentation filed: Charges VN

## 2014-08-18 NOTE — Progress Notes (Signed)
   Department of Radiation Oncology  Phone:  [phone] Fax:        [phone]   Name: Amanda Terrell MRN: [id number]  DOB: [birth year]  Date: 08/18/2014  Follow Up Visit Note  Diagnosis: Breast cancer of upper-outer quadrant of left female breast   Staging form: Breast, AJCC 7th Edition     Clinical stage from 07/13/2014: Stage 0 (Tis (DCIS), N0, M0) - Unsigned     Pathologic stage from 08/08/2014: Stage Unknown (Tis (DCIS), NX, cM0) - Signed by Seward Grater, MD on 08/15/2014       Staging comments: Staged from lumpectomy specimen by Dr. Lyndon Code.  Breast neoplasm, Tis (DCIS)   Staging form: Breast, AJCC 7th Edition     Pathologic stage from 08/08/2014: Stage Unknown (Tis (DCIS), NX) - Unsigned     Clinical: No stage assigned - Unsigned  Interval History: Amanda Terrell presents today for routine followup.  She had her lumpectomy on 1/8 and measure 2.2 cm. One intramammary node was negative. Her tumor was ER and PR positive. Margins were negative. She has an MRI scheduled at [facility] on Wednesday to evaluate her spinal issues. She has recovered well from surgery. She is still tender at her incision over the left breast. She is interested in getting into exercise again. She was wondering if she could move her medical oncology appointment to [facility].   Physical Exam:  Filed Vitals:   08/18/14 1050  BP: 134/77  Pulse: 67  Temp: 97.6 F (36.4 C)  Weight: 140 lb 1.6 oz (63.549 kg)   Alert. Pleasant. No distress. Walks with a cane.  IMPRESSION: Amanda Terrell is a 59 y.o. female s/p lumpectomy.   PLAN:  I spoke to the patient today regarding her diagnosis and options for treatment. We discussed the equivalence in terms of survival and local failure between mastectomy and breast conservation. We discussed the role of radiation in decreasing local failures in patients who undergo lumpectomy. We discussed the process of simulation and the placement tattoos. We discussed 4 weeks of treatment as  an outpatient. We discussed the possibility of asymptomatic lung damage. We discussed the low likelihood of secondary malignancies. We discussed the possible side effects including but not limited to skin redness, fatigue, permanent skin darkening, and breast swelling.   I will reschedule her appointment with medical oncology with Dr. Whitney Muse at her request. She does have mobility issues and this will be closer for them.   I have scheduled her for simulation next week.  Thea Silversmith, MD

## 2014-08-23 ENCOUNTER — Encounter (HOSPITAL_COMMUNITY): Payer: Self-pay | Admitting: Hematology & Oncology

## 2014-08-23 ENCOUNTER — Encounter (HOSPITAL_COMMUNITY): Payer: BLUE CROSS/BLUE SHIELD | Attending: Hematology & Oncology | Admitting: Hematology & Oncology

## 2014-08-23 VITALS — BP 125/71 | HR 72 | Temp 97.5°F | Resp 18 | Ht 65.0 in | Wt 140.6 lb

## 2014-08-23 DIAGNOSIS — Z170 Estrogen receptor positive status [ER+]: Secondary | ICD-10-CM | POA: Insufficient documentation

## 2014-08-23 DIAGNOSIS — C50412 Malignant neoplasm of upper-outer quadrant of left female breast: Secondary | ICD-10-CM | POA: Diagnosis present

## 2014-08-23 DIAGNOSIS — Z780 Asymptomatic menopausal state: Secondary | ICD-10-CM

## 2014-08-23 DIAGNOSIS — Z8619 Personal history of other infectious and parasitic diseases: Secondary | ICD-10-CM | POA: Diagnosis not present

## 2014-08-23 DIAGNOSIS — M419 Scoliosis, unspecified: Secondary | ICD-10-CM | POA: Insufficient documentation

## 2014-08-23 DIAGNOSIS — D0512 Intraductal carcinoma in situ of left breast: Secondary | ICD-10-CM

## 2014-08-23 NOTE — Patient Instructions (Signed)
[place] at [facility]  Discharge Instructions:  We will see you back in one month for further discussion of your DCIS I would like to check your vitamin D level when you return Please call with any problems or concerns prior to follow-up _______________________________________________________________  Thank you for choosing [place] at [facility] to provide your oncology and hematology care.  To afford each patient quality time with our providers, please arrive at least 15 minutes before your scheduled appointment.  You need to re-schedule your appointment if you arrive 10 or more minutes late.  We strive to give you quality time with our providers, and arriving late affects you and other patients whose appointments are after yours.  Also, if you no show three or more times for appointments you may be dismissed from the clinic.  Again, thank you for choosing [place] at [place] hope is that these requests will allow you access to exceptional care and in a timely manner. _______________________________________________________________  If you have questions after your visit, please contact our office at [phone] between the hours of 8:30 a.m. and 5:00 p.m. Voicemails left after 4:30 p.m. will not be returned until the following business day. _______________________________________________________________  For prescription refill requests, have your pharmacy contact our office. _______________________________________________________________  Recommendations made by the consultant and any test results will be sent to your referring physician. _______________________________________________________________

## 2014-08-23 NOTE — Progress Notes (Signed)
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 female is here because of a new diagnosis of DCIS.  A diagnostic L breast mammogram was performed on 07/05/2014 which showed a 1.1 cm suspicious mass within the L breast at the 2 o'clock position 6cm from the nipple.  The abnormality was palpable. The mass and calcifications spanned approximately 1.5 cm by imaging and she underwent and ultrasound guided core biopsy on 07/05/2014.  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  Bilateral breast MRI on 07/12/2014 showed only the 2.0 x 1.4 x 1.3 cm biopsy proven ductal carcinoma.   She underwent a left partial mastectomy with radioactive seed localization and margin assessment with Dr. Dalbert Batman on 08/05/2014.  Margins were clear and no additional surgery was needed.   She takes motrin regularly.  She has scoliosis with a complete spinal fusion.  She has had difficulty with RLE numbness.  In 2006 she had to have repeat spinal surgery because a "screw" from her hardware had affected the spinal cord.  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 She takes hydromorphone very rarely. She goes to [facility] tomorrow to see a specialist there. She is still very active, but emphasizes that her mobility is difficulty.  She has a history of Hepatitis C treated with interferon.  HCV titers have been negative. During the 1980's when she was undergoing back surgery she received 31 units of prbc's. She  was diagnosed with hepatitis C when she went to donate blood.   She is concerned about the side effects of endocrine therapy for DCIS. She states she is open to considering it but feels unable to make a decision at this point.  She has no post-surgical complaints.    Breast cancer of upper-outer quadrant of left female breast   07/05/2014 Initial Biopsy DCIS with necrosis. ER 100% positive, PR 59% positive.    07/05/2014 Imaging mammogram and [place]: 1.1 cm suspicious mass within the left breast at the 2:00 position. There are associated suspicious calcifications. Breast MRI showed 2 cm mass in the upper upper outer quadrant of left breast, otherwise negative.   07/07/2014 Initial Diagnosis Breast cancer of upper-outer quadrant of left female breast   08/05/2014 Surgery Partial mastectomy L breast with Dr. Dalbert Batman, DCIS intermediate grade spanning 2.2 cm. clear margins      MEDICAL HISTORY:  Past Medical History  Diagnosis Date  . Cellulitis and abscess of unspecified site   . Rosacea   . Other specified disease of hair and hair follicles   . Axillary abscess     left  . Heart murmur   . Scoliosis     adhesive arachnoditis-right side  . Hiatal hernia   . GERD (gastroesophageal reflux disease)   . Internal hemorrhoids   . Diverticulosis   . Esophageal stricture   . Barrett's esophagus   . Neuropathy   . IBS (irritable bowel syndrome)   . Hyperlipidemia   . Mitral valve prolapse   . Reflex sympathetic dystrophy   . Breast cancer of upper-outer quadrant of  left female breast 07/11/2014  . Arthritis   . Hepatitis C 1990    interferon treatment  . 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 PONV (postoperative nausea and vomiting)     SURGICAL HISTORY: Past Surgical History  Procedure Laterality Date  . Back surgery  1969, 1989, 2006    due to scoliosis= total 5 back surgeries  . Cesarean section  1976, 1978, 1980, 1984  . Incise and drain abcess      left axillary abscess  .  Breast fibroadenoma surgery      bilateral  . Hysteroscopy    . Colonoscopy  2009  . Upper gastrointestinal endoscopy    . Tonsillectomy    . Spinal surgeries    . Spinal fusion    . Breast lumpectomy with radioactive seed localization Left 08/05/2014    Procedure: LEFT PARTICAL MASTECTOMY WITH RADIOACTIVE SEED LOCALIZATION;  Surgeon: Fanny Skates, MD;  Location: [facility];  Service: General;  Laterality: Left;    SOCIAL HISTORY: History   Social History  . Marital Status: Married    Spouse Name: N/A    Number of Children: N/A  . Years of Education: N/A   Occupational History  . Not on file.   Social History Main Topics  . Smoking status: Never Smoker   . Smokeless tobacco: Never Used  . Alcohol Use: 0.0 oz/week    0 Not specified per week     Comment: 1 glass wine a month  . Drug Use: No  . Sexual Activity: Not on file   Other Topics Concern  . Not on file   Social History Narrative  Born in [place]. Married for 42 years. 3 daughters. 3 grand-daughters. Homemaker, Owns a Ship broker business, [place]. Have [place]. Rare ETOH.  FAMILY HISTORY: Family History  Problem Relation Age of Onset  . Heart disease Mother   . Brain cancer Father   . Colon cancer Neg Hx   . Melanoma Paternal Grandmother    indicated that her mother is deceased. She indicated that her father is deceased.   Mother deceased 68, from heart disease Father deceased 59, glioblastoma 2 Brothers, healthy.   ALLERGIES:  is allergic to acetaminophen.  MEDICATIONS:  Current Outpatient Prescriptions  Medication Sig Dispense Refill  . diphenhydrAMINE (BENADRYL) 25 mg capsule Take 25 mg by mouth at bedtime as needed.    Marland Kitchen HYDROmorphone (DILAUDID) 4 MG tablet Take 1 tablet by mouth every 6 (six) hours as needed.     . Ibuprofen (MOTRIN PO) Take 400 mg by mouth every 4 (four) hours.     . Multiple Vitamin (MULTIVITAMIN) tablet Take 1 tablet by mouth  daily.    Marland Kitchen omeprazole (PRILOSEC) 20 MG capsule Take 1 capsule (20 mg total) by mouth 2 (two) times daily before a meal. 180 capsule 3  . polyethylene glycol powder (GLYCOLAX/MIRALAX) powder Dissolve 17 grams (1 capful) in at least 8 ounces water/juice and drink daily. 527 g 6   No current facility-administered medications for this visit.    Review of Systems  Constitutional: Negative for fever, chills, weight loss, malaise/fatigue and diaphoresis.  HENT: Negative.   Eyes: Negative for blurred vision, double vision, photophobia, pain, discharge and redness.       Wears glasses  Respiratory: Negative.   Cardiovascular: Negative.   Gastrointestinal: Positive for heartburn. Negative for abdominal pain, diarrhea, constipation, blood in stool and melena.       Uses  occasional miralax  Genitourinary: Positive for urgency and frequency. Negative for dysuria, hematuria and flank pain.       Nocturia, 2x  Musculoskeletal: Negative for myalgias and falls.       R leg pain  Skin: Negative.   Neurological: Positive for tingling, sensory change and focal weakness. Negative for weakness.       All symptoms confined to R leg  Endo/Heme/Allergies: Negative.   Psychiatric/Behavioral: Negative.     PHYSICAL EXAMINATION: ECOG PERFORMANCE STATUS: 1 - Symptomatic but completely ambulatory  Filed Vitals:   08/23/14 1445  BP: 125/71  Pulse: 72  Temp: 97.5 F (36.4 C)  Resp: 18   Filed Weights   08/23/14 1445  Weight: 140 lb 9.6 oz (63.776 kg)     Physical Exam  Constitutional: She is oriented to person, place, and time and well-developed, well-nourished, and in no distress.  HENT:  Head: Normocephalic and atraumatic.  Nose: Nose normal.  Mouth/Throat: Oropharynx is clear and moist. No oropharyngeal exudate.  Eyes: Conjunctivae and EOM are normal. Pupils are equal, round, and reactive to light. Right eye exhibits no discharge. Left eye exhibits no discharge. No scleral icterus.  Neck:  Normal range of motion. Neck supple. No tracheal deviation present. No thyromegaly present.  Cardiovascular: Normal rate, regular rhythm and normal heart sounds.  Exam reveals no gallop and no friction rub.   No murmur heard. Pulmonary/Chest: Effort normal and breath sounds normal. She has no wheezes. She has no rales.  Abdominal: Soft. Bowel sounds are normal. She exhibits no distension and no mass. There is no tenderness. There is no rebound and no guarding.  Musculoskeletal: Normal range of motion. She exhibits no edema.  RLE cool to the touch No UE swelling or erythema  Lymphadenopathy:    She has no cervical adenopathy.  Neurological: She is alert and oriented to person, place, and time. No cranial nerve deficit.  Gait abnormality noted Absent patellar reflex RLE  Skin: Skin is warm and dry. No rash noted.  Psychiatric: Mood, memory, affect and judgment normal.  Nursing note and vitals reviewed.    LABORATORY DATA:  I have reviewed the data as listed Lab Results  Component Value Date   WBC 7.1 07/13/2014   HGB 14.7 07/13/2014   HCT 45.7 07/13/2014   MCV 87.9 07/13/2014   PLT 244 07/13/2014     Chemistry      Component Value Date/Time   NA 142 07/13/2014 1220   NA 140 05/19/2014 0938   K 4.3 07/13/2014 1220   K 3.6 05/19/2014 0938   CL 103 05/19/2014 0938   CO2 30* 07/13/2014 1220   CO2 30 05/19/2014 0938   BUN 12.6 07/13/2014 1220   BUN 20 05/19/2014 0938   CREATININE 0.7 07/13/2014 1220   CREATININE 0.6 05/19/2014 0938      Component Value Date/Time   CALCIUM 9.9 07/13/2014 1220   CALCIUM 9.6 05/19/2014 0938   ALKPHOS 78 07/13/2014 1220   ALKPHOS 65 05/19/2014 0938   AST 17 07/13/2014 1220   AST 17 05/19/2014 0938   ALT 14 07/13/2014 1220   ALT 15 05/19/2014 0938   BILITOT 0.58 07/13/2014 1220   BILITOT 0.6 05/19/2014 0938       PATHOLOGY:  Breast, partial mastectomy, Left - DUCTAL CARCINOMA IN SITU WITH CALCIFICATIONS, INTERMEDIATE GRADE, SPANNING  2.2 CM. - ONE BENIGN INTRAMAMMARY LYMPH NODE (0/1). - THE SURGICAL RESECTION MARGINS ARE NEGATIVE FOR DUCTAL CARCINOMA. Specimen, including laterality: Left breast. Procedure (include  lymph node sampling sentinel-non-sentinel): Partial mastectomy. Grade of carcinoma: Intermediate grade. Necrosis: Present. Estimated tumor size: (gross measurement): 2.2 cm. Treatment effect: N/A. Distance to closest margin: Greater than 0.2 cm to all margins. Breast prognostic profile: [phone]. Estrogen receptor: 100%, strong staining intensity. Progesterone receptor: 59%, strong staining intensity. Lymph nodes: None examined. TNM: pTis, pNX. Comments: In additional to the ductal carcinoma in situ with calcifications, there are vascular calcifications as well as foci of adenosis with calcifications. (JBK:ds 08/08/14) Enid Cutter MD Pathologist, Electronic Signature (Case signed 08/08/2014)   ASSESSMENT & PLAN:  Breast cancer of upper-outer quadrant of left female breast Pleasant 59 year old female diagnosed with DCIS of the left breast ER +, PR + who is s/p lumpectomy with Dr. Dalbert Batman.  She is planning on adjuvant XRT with Dr. Pablo Ledger.  Her medical history is complicated by arachnoiditis, RSD of the RLE which limits her mobility some, she is however still remarkably active.   She presents today for discussion of additional therapy for her DCIS; endocrine therapy in the adjuvant setting. She emphasized that she has concerns about taking any additional medications because of her other medical issues. She is not interested in taking Tamoxifen because of "side effects."  I emphasized the overall good proganosis of women with DCIS. We discussed the use of postoperative tamoxifen  treatment for five years to prevent ipsilateral recurrences and new events, both within the affected breast and contralateral breast. The risks of side effects, including endometrial cancer and thromboembolic events were  addressed. We also discussed the use of arimidex as an alternative to tamoxifen in postmenopausal women with ER-positive DCIS. We reviewed the risks of the AI's including loss of bone density, fractures, and cardiovascular risk.  Should she opt for no additional therapy after her radiation we discussed guidelines for ongoing surveillance, consisting of history and physical examination and routine mammography.  I provided her with the NCCN guidelines for DCIS. She would like to obtain another bone density, we ordered this for her today. I discussed with her that given the hardware within the lumbar spine, bone density will be obtained from the femoral neck and the radius, and ulna of the arm. I advised her she does not have to make any definitive decisions today. I encouraged her to move forward with her radiation and we can regroup within several weeks to have further discussion. If she has any interim concerns or problems I have advised her to call but we will see her back again in approximately 4 weeks' time.    Orders Placed This Encounter  Procedures  . DG Bone Density    Standing Status: Future     Number of Occurrences:      Standing Expiration Date: 08/23/2015    Order Specific Question:  Reason for Exam (SYMPTOM  OR DIAGNOSIS REQUIRED)    Answer:  post menopausal    Order Specific Question:  Is the patient pregnant?    Answer:  No    Order Specific Question:  Preferred imaging location?    Answer:  [facility]  . Vitamin D 25 hydroxy    All questions were answered. The patient knows to call the clinic with any problems, questions or concerns.   Molli Hazard, MD MD 08/25/2014 7:45 AM

## 2014-08-23 NOTE — Progress Notes (Signed)
Amanda Terrell's reason for visit today is for labs as scheduled per MD orders.  Venipuncture performed with a 23 gauge butterfly needle to R Antecubital.  Amanda Terrell tolerated procedure well and without incident; questions were answered and patient was discharged.

## 2014-08-24 ENCOUNTER — Telehealth: Payer: Self-pay | Admitting: Hematology

## 2014-08-24 LAB — VITAMIN D 25 HYDROXY (VIT D DEFICIENCY, FRACTURES): Vit D, 25-Hydroxy: 29.1 ng/mL — ABNORMAL LOW (ref 30.0–100.0)

## 2014-08-24 NOTE — Telephone Encounter (Signed)
Patient canceled appointment having treatment done at [facility].

## 2014-08-25 ENCOUNTER — Ambulatory Visit
Admission: RE | Admit: 2014-08-25 | Discharge: 2014-08-25 | Disposition: A | Discharge status: Home / Self Care | Payer: BLUE CROSS/BLUE SHIELD | Source: Ambulatory Visit | Attending: Radiation Oncology | Admitting: Radiation Oncology

## 2014-08-25 DIAGNOSIS — Z510 Encounter for antineoplastic radiation therapy: Secondary | ICD-10-CM | POA: Diagnosis not present

## 2014-08-25 DIAGNOSIS — C50412 Malignant neoplasm of upper-outer quadrant of left female breast: Secondary | ICD-10-CM

## 2014-08-25 NOTE — Addendum Note (Signed)
Encounter addended by: Thea Silversmith, MD on: 08/25/2014 12:52 PM<BR>     Documentation filed: Notes Section

## 2014-08-25 NOTE — Progress Notes (Signed)
Radiation Oncology         [phone] ________________________________  Name: Amanda Terrell      MRN: [id number]          Date: 08/25/2014              DOB: [birth year]  Optical Surface Tracking Plan:  Since intensity modulated radiotherapy (IMRT) and 3D conformal radiation treatment methods are predicated on accurate and precise positioning for treatment, intrafraction motion monitoring is medically necessary to ensure accurate and safe treatment delivery.  The ability to quantify intrafraction motion without excessive ionizing radiation dose can only be performed with optical surface tracking. Accordingly, surface imaging offers the opportunity to obtain 3D measurements of patient position throughout IMRT and 3D treatments without excessive radiation exposure.  I am ordering optical surface tracking for this patient's upcoming course of radiotherapy. ________________________________ Signature   Reference:   Ursula Alert, J, et al. Surface imaging-based analysis of intrafraction motion for breast radiotherapy patients.Journal of [place], n. 6, nov. 2014. ISSN [id number].   Available at: <http://www.jacmp.org/index.php/jacmp/article/view/4957>.

## 2014-08-25 NOTE — Progress Notes (Signed)
Name: Amanda Terrell   MRN: [id number]  Date:  08/25/2014  DOB: 06-03-1956  Status:outpatient    DIAGNOSIS: Breast cancer.  CONSENT VERIFIED: yes   SET UP: Patient is setup supine   IMMOBILIZATION:  The following immobilization was used:Custom Moldable Pillow, breast board.   NARRATIVE: [name] was brought to the [facility].  Identity was confirmed.  All relevant records and images related to the planned course of therapy were reviewed.  Then, the patient was positioned in a stable reproducible clinical set-up for radiation therapy.  Wires were placed to delineate the clinical extent of breast tissue. A wire was placed on the scar as well.  CT images were obtained.  An isocenter was placed. Skin markings were placed.  The CT images were loaded into the planning software where the target and avoidance structures were contoured.  The radiation prescription was entered and confirmed. The patient was discharged in stable condition and tolerated simulation well.    TREATMENT PLANNING NOTE:  Treatment planning then occurred. I have requested : MLC's, isodose plan, basic dose calculation  I personally designed and supervised the construction of 3 medically necessary complex treatment devices for the protection of critical normal structures including the lungs and contralateral breast as well as the immobilization device which is necessary for set up certainty.   3D simulation occurred. I requested and analyzed a dose volume histogram of the heart, lungs and lumpectomy cavity.

## 2014-08-25 NOTE — Assessment & Plan Note (Addendum)
Pleasant 59 year old female diagnosed with DCIS of the left breast ER +, PR + who is s/p lumpectomy with Dr. Dalbert Batman.  She is planning on adjuvant XRT with Dr. Pablo Ledger.  Her medical history is complicated by arachnoiditis, RSD of the RLE which limits her mobility some, she is however still remarkably active.   She presents today for discussion of additional therapy for her DCIS; endocrine therapy in the adjuvant setting. She emphasized that she has concerns about taking any additional medications because of her other medical issues. She is not interested in taking Tamoxifen because of "side effects."  I emphasized the overall good proganosis of women with DCIS. We discussed the use of postoperative tamoxifen  treatment for five years to prevent ipsilateral recurrences and new events, both within the affected breast and contralateral breast. The risks of side effects, including endometrial cancer and thromboembolic events were addressed. We also discussed the use of arimidex as an alternative to tamoxifen in postmenopausal women with ER-positive DCIS. We reviewed the risks of the AI's including loss of bone density, fractures, and cardiovascular risk.  Should she opt for no additional therapy after her radiation we discussed guidelines for ongoing surveillance, consisting of history and physical examination and routine mammography.  I provided her with the NCCN guidelines for DCIS. She would like to obtain another bone density, we ordered this for her today. I discussed with her that given the hardware within the lumbar spine, bone density will be obtained from the femoral neck and the radius, and ulna of the arm. I advised her she does not have to make any definitive decisions today. I encouraged her to move forward with her radiation and we can regroup within several weeks to have further discussion. If she has any interim concerns or problems I have advised her to call but we will see her back again in  approximately 4 weeks' time.

## 2014-08-26 ENCOUNTER — Ambulatory Visit: Payer: BC Managed Care – PPO | Admitting: Hematology

## 2014-08-29 ENCOUNTER — Ambulatory Visit (HOSPITAL_COMMUNITY)
Admission: RE | Admit: 2014-08-29 | Discharge: 2014-08-29 | Disposition: A | Discharge status: Home / Self Care | Payer: BLUE CROSS/BLUE SHIELD | Source: Ambulatory Visit | Attending: Hematology & Oncology | Admitting: Hematology & Oncology

## 2014-08-29 DIAGNOSIS — Z780 Asymptomatic menopausal state: Secondary | ICD-10-CM | POA: Insufficient documentation

## 2014-08-31 DIAGNOSIS — Z510 Encounter for antineoplastic radiation therapy: Secondary | ICD-10-CM | POA: Diagnosis not present

## 2014-09-01 ENCOUNTER — Ambulatory Visit
Admission: RE | Admit: 2014-09-01 | Discharge: 2014-09-01 | Disposition: A | Discharge status: Home / Self Care | Payer: BLUE CROSS/BLUE SHIELD | Source: Ambulatory Visit | Attending: Radiation Oncology | Admitting: Radiation Oncology

## 2014-09-01 ENCOUNTER — Ambulatory Visit (HOSPITAL_COMMUNITY): Payer: BLUE CROSS/BLUE SHIELD | Admitting: Hematology & Oncology

## 2014-09-01 DIAGNOSIS — Z510 Encounter for antineoplastic radiation therapy: Secondary | ICD-10-CM | POA: Diagnosis not present

## 2014-09-05 ENCOUNTER — Ambulatory Visit
Admission: RE | Admit: 2014-09-05 | Discharge: 2014-09-05 | Disposition: A | Discharge status: Home / Self Care | Payer: BLUE CROSS/BLUE SHIELD | Source: Ambulatory Visit | Attending: Radiation Oncology | Admitting: Radiation Oncology

## 2014-09-05 DIAGNOSIS — Z510 Encounter for antineoplastic radiation therapy: Secondary | ICD-10-CM | POA: Diagnosis not present

## 2014-09-06 ENCOUNTER — Ambulatory Visit
Admission: RE | Admit: 2014-09-06 | Discharge: 2014-09-06 | Disposition: A | Discharge status: Home / Self Care | Payer: BLUE CROSS/BLUE SHIELD | Source: Ambulatory Visit | Attending: Radiation Oncology | Admitting: Radiation Oncology

## 2014-09-06 VITALS — BP 125/71 | HR 72 | Temp 97.8°F | Wt 139.4 lb

## 2014-09-06 DIAGNOSIS — Z510 Encounter for antineoplastic radiation therapy: Secondary | ICD-10-CM | POA: Insufficient documentation

## 2014-09-06 DIAGNOSIS — C50412 Malignant neoplasm of upper-outer quadrant of left female breast: Secondary | ICD-10-CM

## 2014-09-06 MED ORDER — RADIAPLEXRX EX GEL
Freq: Once | CUTANEOUS | Status: AC
  Administered 2014-09-06: 12:00:00 via TOPICAL

## 2014-09-06 MED ORDER — ALRA NON-METALLIC DEODORANT (RAD-ONC)
1.0000 "application " | Freq: Once | TOPICAL | Status: AC
  Administered 2014-09-06: 1 via TOPICAL

## 2014-09-06 NOTE — Progress Notes (Signed)
Weekly Management Note Current Dose: 5.34  Gy  Projected Dose: 42.72 Gy   Narrative:  The patient presents for routine under treatment assessment.  CBCT/MVCT images/Port film x-rays were reviewed.  The chart was checked. Doing well. No complaints.   Physical Findings: Weight: 139 lb 6.4 oz (63.231 kg). Unchanged  Impression:  The patient is tolerating radiation.  Plan:  Continue treatment as planned. RN education today. Start radiaplex.

## 2014-09-06 NOTE — Progress Notes (Signed)
Weekly assessment of radiation to left breast.Completed 2 of 21.Reviewed clinic routine with patient and spouse, weekly assessment every Tuesday after treatment.Given Radiation Therapy and You Booklet, alra deodorant and radiaplex.Reviewed possible side effects to include skin changes, tenderness, swelling and fatigue.Informed to not wear underwire bra.Apply radiaplex twice daily after treatment and at bedtime.Understands if any concerns to inform therapist so she may be brought to nursing for assessment.

## 2014-09-07 ENCOUNTER — Ambulatory Visit
Admission: RE | Admit: 2014-09-07 | Discharge: 2014-09-07 | Disposition: A | Discharge status: Home / Self Care | Payer: BLUE CROSS/BLUE SHIELD | Source: Ambulatory Visit | Attending: Radiation Oncology | Admitting: Radiation Oncology

## 2014-09-07 DIAGNOSIS — Z510 Encounter for antineoplastic radiation therapy: Secondary | ICD-10-CM | POA: Diagnosis not present

## 2014-09-08 ENCOUNTER — Ambulatory Visit
Admission: RE | Admit: 2014-09-08 | Discharge: 2014-09-08 | Disposition: A | Discharge status: Home / Self Care | Payer: BLUE CROSS/BLUE SHIELD | Source: Ambulatory Visit | Attending: Radiation Oncology | Admitting: Radiation Oncology

## 2014-09-08 DIAGNOSIS — Z510 Encounter for antineoplastic radiation therapy: Secondary | ICD-10-CM | POA: Diagnosis not present

## 2014-09-09 ENCOUNTER — Ambulatory Visit
Admission: RE | Admit: 2014-09-09 | Discharge: 2014-09-09 | Disposition: A | Discharge status: Home / Self Care | Payer: BLUE CROSS/BLUE SHIELD | Source: Ambulatory Visit | Attending: Radiation Oncology | Admitting: Radiation Oncology

## 2014-09-09 DIAGNOSIS — Z510 Encounter for antineoplastic radiation therapy: Secondary | ICD-10-CM | POA: Diagnosis not present

## 2014-09-12 ENCOUNTER — Ambulatory Visit
Admission: RE | Admit: 2014-09-12 | Discharge: 2014-09-12 | Disposition: A | Discharge status: Home / Self Care | Payer: BLUE CROSS/BLUE SHIELD | Source: Ambulatory Visit | Attending: Radiation Oncology | Admitting: Radiation Oncology

## 2014-09-12 DIAGNOSIS — Z510 Encounter for antineoplastic radiation therapy: Secondary | ICD-10-CM | POA: Diagnosis not present

## 2014-09-13 ENCOUNTER — Ambulatory Visit: Payer: BLUE CROSS/BLUE SHIELD | Admitting: Radiation Oncology

## 2014-09-13 ENCOUNTER — Ambulatory Visit
Admission: RE | Admit: 2014-09-13 | Discharge: 2014-09-13 | Disposition: A | Discharge status: Home / Self Care | Payer: BLUE CROSS/BLUE SHIELD | Source: Ambulatory Visit | Attending: Radiation Oncology | Admitting: Radiation Oncology

## 2014-09-13 VITALS — BP 121/74 | HR 70 | Temp 97.7°F | Wt 139.3 lb

## 2014-09-13 DIAGNOSIS — Z510 Encounter for antineoplastic radiation therapy: Secondary | ICD-10-CM | POA: Diagnosis not present

## 2014-09-13 DIAGNOSIS — C50412 Malignant neoplasm of upper-outer quadrant of left female breast: Secondary | ICD-10-CM

## 2014-09-13 NOTE — Progress Notes (Signed)
Weekly assessment of radiation to left breast.Completed 7 of 16 treatments.Skin looks fine.Continue application of radiaplex.Chronic back pain being worked up by [facility].

## 2014-09-13 NOTE — Progress Notes (Signed)
Weekly Management Note Current Dose: 18.69  Gy  Projected Dose: 42.72 Gy   Narrative:  The patient presents for routine under treatment assessment.  CBCT/MVCT images/Port film x-rays were reviewed.  The chart was checked. Doing well. No complaints.   Physical Findings: Weight: 139 lb 4.8 oz (63.186 kg). Unchanged.   Impression:  The patient is tolerating radiation.  Plan:  Continue treatment as planned. Continue radiaplex.

## 2014-09-14 ENCOUNTER — Ambulatory Visit
Admission: RE | Admit: 2014-09-14 | Discharge: 2014-09-14 | Disposition: A | Discharge status: Home / Self Care | Payer: BLUE CROSS/BLUE SHIELD | Source: Ambulatory Visit | Attending: Radiation Oncology | Admitting: Radiation Oncology

## 2014-09-14 DIAGNOSIS — Z510 Encounter for antineoplastic radiation therapy: Secondary | ICD-10-CM | POA: Diagnosis not present

## 2014-09-15 ENCOUNTER — Ambulatory Visit
Admission: RE | Admit: 2014-09-15 | Discharge: 2014-09-15 | Disposition: A | Discharge status: Home / Self Care | Payer: BLUE CROSS/BLUE SHIELD | Source: Ambulatory Visit | Attending: Radiation Oncology | Admitting: Radiation Oncology

## 2014-09-15 DIAGNOSIS — Z510 Encounter for antineoplastic radiation therapy: Secondary | ICD-10-CM | POA: Diagnosis not present

## 2014-09-16 ENCOUNTER — Ambulatory Visit
Admission: RE | Admit: 2014-09-16 | Discharge: 2014-09-16 | Disposition: A | Discharge status: Home / Self Care | Payer: BLUE CROSS/BLUE SHIELD | Source: Ambulatory Visit | Attending: Radiation Oncology | Admitting: Radiation Oncology

## 2014-09-16 DIAGNOSIS — Z510 Encounter for antineoplastic radiation therapy: Secondary | ICD-10-CM | POA: Diagnosis not present

## 2014-09-19 ENCOUNTER — Ambulatory Visit
Admission: RE | Admit: 2014-09-19 | Discharge: 2014-09-19 | Disposition: A | Discharge status: Home / Self Care | Payer: BLUE CROSS/BLUE SHIELD | Source: Ambulatory Visit | Attending: Radiation Oncology | Admitting: Radiation Oncology

## 2014-09-19 DIAGNOSIS — Z510 Encounter for antineoplastic radiation therapy: Secondary | ICD-10-CM | POA: Diagnosis not present

## 2014-09-20 ENCOUNTER — Encounter: Payer: Self-pay | Admitting: Radiation Oncology

## 2014-09-20 ENCOUNTER — Ambulatory Visit
Admission: RE | Admit: 2014-09-20 | Discharge: 2014-09-20 | Disposition: A | Discharge status: Home / Self Care | Payer: BLUE CROSS/BLUE SHIELD | Source: Ambulatory Visit | Attending: Radiation Oncology | Admitting: Radiation Oncology

## 2014-09-20 ENCOUNTER — Ambulatory Visit: Payer: BLUE CROSS/BLUE SHIELD | Admitting: Radiation Oncology

## 2014-09-20 VITALS — BP 114/67 | HR 81 | Resp 16 | Wt 139.4 lb

## 2014-09-20 DIAGNOSIS — C50412 Malignant neoplasm of upper-outer quadrant of left female breast: Secondary | ICD-10-CM

## 2014-09-20 DIAGNOSIS — Z510 Encounter for antineoplastic radiation therapy: Secondary | ICD-10-CM | POA: Diagnosis not present

## 2014-09-20 NOTE — Progress Notes (Signed)
Weight and vitals stable. Denies pain. Nipple of left breast dry and itchy. No hyperpigmentation of treated left breast noted. Reports using radiaplex bid as directed. Understands she can apply hydrocortisone to itchy skin. Denis fatige.

## 2014-09-20 NOTE — Progress Notes (Signed)
Weekly Management Note Current Dose:  32.04 Gy  Projected Dose: 52.72 Gy   Narrative:  The patient presents for routine under treatment assessment.  CBCT/MVCT images/Port film x-rays were reviewed.  The chart was checked. Doing well. Nipple is dry. Using radiaplex.   Physical Findings: Weight: 139 lb 6.4 oz (63.231 kg). Minimal pink skin over left breast.   Impression:  The patient is tolerating radiation.  Plan:  Continue treatment as planned. Continue radiaplex.

## 2014-09-21 ENCOUNTER — Ambulatory Visit
Admission: RE | Admit: 2014-09-21 | Discharge: 2014-09-21 | Disposition: A | Discharge status: Home / Self Care | Payer: BLUE CROSS/BLUE SHIELD | Source: Ambulatory Visit | Attending: Radiation Oncology | Admitting: Radiation Oncology

## 2014-09-21 ENCOUNTER — Ambulatory Visit: Payer: BLUE CROSS/BLUE SHIELD | Admitting: Radiation Oncology

## 2014-09-21 DIAGNOSIS — Z510 Encounter for antineoplastic radiation therapy: Secondary | ICD-10-CM | POA: Diagnosis not present

## 2014-09-22 ENCOUNTER — Ambulatory Visit
Admission: RE | Admit: 2014-09-22 | Discharge: 2014-09-22 | Disposition: A | Discharge status: Home / Self Care | Payer: BLUE CROSS/BLUE SHIELD | Source: Ambulatory Visit | Attending: Radiation Oncology | Admitting: Radiation Oncology

## 2014-09-22 ENCOUNTER — Encounter (HOSPITAL_COMMUNITY): Payer: Self-pay | Admitting: Hematology & Oncology

## 2014-09-22 ENCOUNTER — Encounter (HOSPITAL_COMMUNITY): Payer: BLUE CROSS/BLUE SHIELD | Attending: Hematology & Oncology | Admitting: Hematology & Oncology

## 2014-09-22 VITALS — BP 125/65 | HR 100 | Temp 97.7°F | Resp 18 | Wt 141.0 lb

## 2014-09-22 DIAGNOSIS — M2550 Pain in unspecified joint: Secondary | ICD-10-CM

## 2014-09-22 DIAGNOSIS — D0512 Intraductal carcinoma in situ of left breast: Secondary | ICD-10-CM

## 2014-09-22 DIAGNOSIS — Z170 Estrogen receptor positive status [ER+]: Secondary | ICD-10-CM | POA: Insufficient documentation

## 2014-09-22 DIAGNOSIS — Z510 Encounter for antineoplastic radiation therapy: Secondary | ICD-10-CM | POA: Diagnosis not present

## 2014-09-22 DIAGNOSIS — M419 Scoliosis, unspecified: Secondary | ICD-10-CM | POA: Insufficient documentation

## 2014-09-22 DIAGNOSIS — M545 Low back pain: Secondary | ICD-10-CM

## 2014-09-22 DIAGNOSIS — Z8619 Personal history of other infectious and parasitic diseases: Secondary | ICD-10-CM | POA: Insufficient documentation

## 2014-09-22 DIAGNOSIS — C50412 Malignant neoplasm of upper-outer quadrant of left female breast: Secondary | ICD-10-CM | POA: Insufficient documentation

## 2014-09-22 MED ORDER — TAMOXIFEN CITRATE 20 MG PO TABS: 20.0000 mg | ORAL_TABLET | Freq: Every day | ORAL | Status: DC

## 2014-09-22 NOTE — Patient Instructions (Signed)
[place] at [facility] Discharge Instructions  RECOMMENDATIONS MADE BY THE CONSULTANT AND ANY TEST RESULTS WILL BE SENT TO YOUR REFERRING PHYSICIAN.  Start Tamoxifen after your radiation treatments are complete. Return as scheduled for office visit and lab work.  Thank you for choosing [place] at [facility] to provide your oncology and hematology care.  To afford each patient quality time with our provider, please arrive at least 15 minutes before your scheduled appointment time.    You need to re-schedule your appointment should you arrive 10 or more minutes late.  We strive to give you quality time with our providers, and arriving late affects you and other patients whose appointments are after yours.  Also, if you no show three or more times for appointments you may be dismissed from the clinic at the providers discretion.     Again, thank you for choosing [facility].  Our hope is that these requests will decrease the amount of time that you wait before being seen by our physicians.       _____________________________________________________________  Should you have questions after your visit to [facility], please contact our office at [phone] between the hours of 8:30 a.m. and 4:30 p.m.  Voicemails left after 4:30 p.m. will not be returned until the following business day.  For prescription refill requests, have your pharmacy contact our office.

## 2014-09-23 ENCOUNTER — Ambulatory Visit (HOSPITAL_COMMUNITY): Payer: BLUE CROSS/BLUE SHIELD | Admitting: Hematology & Oncology

## 2014-09-23 ENCOUNTER — Other Ambulatory Visit (HOSPITAL_COMMUNITY): Payer: BLUE CROSS/BLUE SHIELD

## 2014-09-23 ENCOUNTER — Ambulatory Visit
Admission: RE | Admit: 2014-09-23 | Discharge: 2014-09-23 | Disposition: A | Discharge status: Home / Self Care | Payer: BLUE CROSS/BLUE SHIELD | Source: Ambulatory Visit | Attending: Radiation Oncology | Admitting: Radiation Oncology

## 2014-09-23 DIAGNOSIS — Z510 Encounter for antineoplastic radiation therapy: Secondary | ICD-10-CM | POA: Diagnosis not present

## 2014-09-26 ENCOUNTER — Ambulatory Visit
Admission: RE | Admit: 2014-09-26 | Discharge: 2014-09-26 | Disposition: A | Discharge status: Home / Self Care | Payer: BLUE CROSS/BLUE SHIELD | Source: Ambulatory Visit | Attending: Radiation Oncology | Admitting: Radiation Oncology

## 2014-09-26 DIAGNOSIS — Z510 Encounter for antineoplastic radiation therapy: Secondary | ICD-10-CM | POA: Diagnosis not present

## 2014-09-27 ENCOUNTER — Ambulatory Visit
Admission: RE | Admit: 2014-09-27 | Discharge: 2014-09-27 | Disposition: A | Discharge status: Home / Self Care | Payer: BLUE CROSS/BLUE SHIELD | Source: Ambulatory Visit | Attending: Radiation Oncology | Admitting: Radiation Oncology

## 2014-09-27 DIAGNOSIS — Z510 Encounter for antineoplastic radiation therapy: Secondary | ICD-10-CM | POA: Diagnosis not present

## 2014-09-27 DIAGNOSIS — C50412 Malignant neoplasm of upper-outer quadrant of left female breast: Secondary | ICD-10-CM

## 2014-09-27 NOTE — Progress Notes (Signed)
Weekly Management Note Current Dose: 44.72  Gy  Projected Dose: 52.72 Gy   Narrative:  The patient presents for routine under treatment assessment.  CBCT/MVCT images/Port film x-rays were reviewed.  The chart was checked. Doing well. Itching medially. Using hydrocortisone. Saw [place].   Physical Findings: Weight:  . Unchanged. Pink skin with dry desquamation medially.   Impression:  The patient is tolerating radiation.  Plan:  Continue treatment as planned. Follow up in 1 month. Continue hydrocortisone.

## 2014-09-28 ENCOUNTER — Ambulatory Visit
Admission: RE | Admit: 2014-09-28 | Discharge: 2014-09-28 | Disposition: A | Discharge status: Home / Self Care | Payer: BLUE CROSS/BLUE SHIELD | Source: Ambulatory Visit | Attending: Radiation Oncology | Admitting: Radiation Oncology

## 2014-09-28 DIAGNOSIS — Z510 Encounter for antineoplastic radiation therapy: Secondary | ICD-10-CM | POA: Diagnosis not present

## 2014-09-29 ENCOUNTER — Ambulatory Visit
Admission: RE | Admit: 2014-09-29 | Discharge: 2014-09-29 | Disposition: A | Discharge status: Home / Self Care | Payer: BLUE CROSS/BLUE SHIELD | Source: Ambulatory Visit | Attending: Radiation Oncology | Admitting: Radiation Oncology

## 2014-09-29 ENCOUNTER — Encounter: Payer: Self-pay | Admitting: Adult Health

## 2014-09-29 DIAGNOSIS — Z510 Encounter for antineoplastic radiation therapy: Secondary | ICD-10-CM | POA: Diagnosis not present

## 2014-09-29 NOTE — Progress Notes (Signed)
I met with Amanda Terrell briefly after her radiation therapy treatment today.  She is scheduled to complete treatment on 10/03/14.  I gave her the "Life After Cancer for Every Survivor" survivorship booklet, along with her follow-up appointments.   She will see Dr. Pablo Ledger for her 72-monthRadiation Oncology follow-up on: 11/04/14 at 3:30pm.  [facility]14 at 2:30pm.  Ms. WSlomskiwas given a card with these appointments written on it and she expressed verbal understanding.  She was encouraged to call with any questions/concerns she may have before her next appointment here.   GMike Craze NP [phone]

## 2014-09-30 ENCOUNTER — Ambulatory Visit
Admission: RE | Admit: 2014-09-30 | Discharge: 2014-09-30 | Disposition: A | Discharge status: Home / Self Care | Payer: BLUE CROSS/BLUE SHIELD | Source: Ambulatory Visit | Attending: Radiation Oncology | Admitting: Radiation Oncology

## 2014-09-30 DIAGNOSIS — Z510 Encounter for antineoplastic radiation therapy: Secondary | ICD-10-CM | POA: Diagnosis not present

## 2014-10-03 ENCOUNTER — Ambulatory Visit
Admission: RE | Admit: 2014-10-03 | Discharge: 2014-10-03 | Disposition: A | Discharge status: Home / Self Care | Payer: BLUE CROSS/BLUE SHIELD | Source: Ambulatory Visit | Attending: Radiation Oncology | Admitting: Radiation Oncology

## 2014-10-03 DIAGNOSIS — C50412 Malignant neoplasm of upper-outer quadrant of left female breast: Secondary | ICD-10-CM

## 2014-10-03 DIAGNOSIS — Z510 Encounter for antineoplastic radiation therapy: Secondary | ICD-10-CM | POA: Diagnosis not present

## 2014-10-03 MED ORDER — RADIAPLEXRX EX GEL
Freq: Once | CUTANEOUS | Status: AC
  Administered 2014-10-03: 15:00:00 via TOPICAL

## 2014-10-04 ENCOUNTER — Ambulatory Visit
Admission: RE | Admit: 2014-10-04 | Discharge: 2014-10-04 | Disposition: A | Discharge status: Home / Self Care | Payer: BLUE CROSS/BLUE SHIELD | Source: Ambulatory Visit | Attending: Radiation Oncology | Admitting: Radiation Oncology

## 2014-10-04 ENCOUNTER — Encounter: Payer: Self-pay | Admitting: Radiation Oncology

## 2014-10-04 DIAGNOSIS — Z510 Encounter for antineoplastic radiation therapy: Secondary | ICD-10-CM | POA: Diagnosis not present

## 2014-10-04 NOTE — Progress Notes (Signed)
  Radiation Oncology         [phone] ________________________________  Name: Amanda Terrell MRN: [id number]  Date: 10/04/2014  DOB: 1956-06-22  End of Treatment Note  Diagnosis:   DCIS of the left breast    Indication for treatment:  Curative    Radiation treatment dates:   09/05/2014-10/04/2014  Site/dose:   Left breast/ 42.72 Gy at 2.67 Gy per fraction x 21 fractions.  Left breast boost/ 10 Gy at 2 Gy per fraction x 5 fractions  Beams/energy:  Opposed tangents with reduced fields / 6 MV photons Enface electrons / 9 MeV electrons  Narrative: The patient tolerated radiation treatment relatively well.  She had minimal skin irritation which was treated with radiaplex.  Plan: The patient has completed radiation treatment. The patient will return to radiation oncology clinic for routine followup in one month. I advised them to call or return sooner if they have any questions or concerns related to their recovery or treatment.  ------------------------------------------------  Thea Silversmith, MD

## 2014-10-12 NOTE — Progress Notes (Signed)
[place] NOTE  Patient Care Team: [name], MD as PCP - General (Internal Medicine) Glenna Fellows, MD as Referring Physician (Neurosurgery) Fanny Skates, MD as Consulting Physician (General Surgery) Truitt Merle, MD as Consulting Physician (Hematology) Thea Silversmith, MD as Consulting Physician (Radiation Oncology) Holley Bouche, NP as Nurse Practitioner (Nurse Practitioner)  CHIEF COMPLAINTS/PURPOSE OF CONSULTATION:  DCIS of the Left Breast ER+ (100%) , PR+ (59%) Bone density scan on 08/29/2014 with osteoporosis   HISTORY OF PRESENTING ILLNESS:  Amanda Terrell 59 y.o. female is here because of a new diagnosis of DCIS. She finishes radiation therapy on March 7. She notes her course has been fairly good with only some mild redness to the breasts. She saw a Psychologist, sport and exercise at [facility] in regards to her back and seems pleased with her visit there. She continues to be very hesitant to try any form of endocrine therapy for her DCIS. She also has questions regarding her recent DEXA scan.    Breast cancer of upper-outer quadrant of left female breast   07/05/2014 Initial Biopsy DCIS with necrosis. ER 100% positive, PR 59% positive.    07/05/2014 Imaging mammogram and [place]: 1.1 cm suspicious mass within the left breast at the 2:00 position. There are associated suspicious calcifications. Breast MRI showed 2 cm mass in the upper upper outer quadrant of left breast, otherwise negative.   07/07/2014 Initial Diagnosis Breast cancer of upper-outer quadrant of left female breast   08/05/2014 Surgery Partial mastectomy L breast with Dr. Dalbert Batman, DCIS intermediate grade spanning 2.2 cm. clear margins      MEDICAL HISTORY:  Past Medical History  Diagnosis Date  . Cellulitis and abscess of unspecified site   . Rosacea   . Other specified disease of hair and hair follicles   . Axillary abscess     left  . Heart murmur   . Scoliosis     adhesive arachnoditis-right side  . Hiatal hernia    . GERD (gastroesophageal reflux disease)   . Internal hemorrhoids   . Diverticulosis   . Esophageal stricture   . Barrett's esophagus   . Neuropathy   . IBS (irritable bowel syndrome)   . Hyperlipidemia   . Mitral valve prolapse   . Reflex sympathetic dystrophy   . Breast cancer of upper-outer quadrant of left female breast 07/11/2014  . Arthritis   . Hepatitis C 1990    interferon treatment  . 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 PONV (postoperative nausea and vomiting)     SURGICAL HISTORY: Past Surgical History  Procedure Laterality Date  . Back surgery  1969, 1989, 2006    due to scoliosis= total 5 back surgeries  . Cesarean section  1976, 1978, 1980, 1984  . Incise and drain abcess      left axillary abscess  . Breast fibroadenoma surgery      bilateral  . Hysteroscopy    . Colonoscopy  2009  . Upper gastrointestinal endoscopy    . Tonsillectomy    . Spinal surgeries    . Spinal fusion    . Breast lumpectomy with radioactive seed localization Left 08/05/2014    Procedure: LEFT PARTICAL MASTECTOMY WITH RADIOACTIVE SEED LOCALIZATION;  Surgeon: Fanny Skates, MD;  Location: [place];  Service: General;  Laterality: Left;    SOCIAL HISTORY: History   Social History  . Marital Status: Married    Spouse Name: N/A  . Number of Children: N/A  .  Years of Education: N/A   Occupational History  . Not on file.   Social History Main Topics  . Smoking status: Never Smoker   . Smokeless tobacco: Never Used  . Alcohol Use: 0.0 oz/week    0 Standard drinks or equivalent per week     Comment: 1 glass wine a month  . Drug Use: No  . Sexual Activity: Not on file   Other Topics Concern  . Not on file   Social History Narrative  Born in [place]. Married for 42 years. 3 daughters. 3 grand-daughters. 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 Rare ETOH.  FAMILY HISTORY: Family History  Problem  Relation Age of Onset  . Heart disease Mother   . Brain cancer Father   . Colon cancer Neg Hx   . Melanoma Paternal Grandmother    indicated that her mother is deceased. She indicated that her father is deceased.   Mother deceased 31, from heart disease Father deceased 73, glioblastoma 2 Brothers, healthy.   ALLERGIES:  is allergic to acetaminophen.  MEDICATIONS:  Current Outpatient Prescriptions  Medication Sig Dispense Refill  . Calcium Carbonate-Vit D-Min (CALCIUM 1200 PO) Take 1,200 mg by mouth.    . diphenhydrAMINE (BENADRYL) 25 mg capsule Take 25 mg by mouth at bedtime as needed.    Marland Kitchen HYDROmorphone (DILAUDID) 4 MG tablet Take 1 tablet by mouth every 6 (six) hours as needed.     . Ibuprofen (MOTRIN PO) Take 400 mg by mouth every 4 (four) hours.     . Multiple Vitamin (MULTIVITAMIN) tablet Take 1 tablet by mouth daily.    . non-metallic deodorant Jethro Poling) MISC Apply 1 application topically daily as needed.    Marland Kitchen omeprazole (PRILOSEC) 20 MG capsule Take 1 capsule (20 mg total) by mouth 2 (two) times daily before a meal. 180 capsule 3  . polyethylene glycol powder (GLYCOLAX/MIRALAX) powder Dissolve 17 grams (1 capful) in at least 8 ounces water/juice and drink daily. 527 g 6  . Wound Dressings (RADIAGEL EX) Apply 170 g topically.    . cholecalciferol (VITAMIN D) 1000 UNITS tablet Take 1,000 Units by mouth daily. 1000 to 2000 units/day    . tamoxifen (NOLVADEX) 20 MG tablet Take 1 tablet (20 mg total) by mouth daily. 30 tablet 3   No current facility-administered medications for this visit.    Review of Systems  Constitutional: Negative.   HENT: Negative.   Eyes: Negative.   Respiratory: Negative.   Cardiovascular: Negative.   Gastrointestinal: Negative.   Genitourinary: Negative.   Musculoskeletal: Positive for back pain and joint pain.  Skin: Negative.   Neurological:       LE pain and numbness, limited ROM  Endo/Heme/Allergies: Negative.   Psychiatric/Behavioral:  Negative.     PHYSICAL EXAMINATION: ECOG PERFORMANCE STATUS: 1 - Symptomatic but completely ambulatory  Filed Vitals:   09/22/14 1337  BP: 125/65  Pulse: 100  Temp: 97.7 F (36.5 C)  Resp: 18   Filed Weights   09/22/14 1337  Weight: 141 lb (63.957 kg)     Physical Exam  Constitutional: She is oriented to person, place, and time and well-developed, well-nourished, and in no distress.  HENT:  Head: Normocephalic and atraumatic.  Nose: Nose normal.  Mouth/Throat: Oropharynx is clear and moist. No oropharyngeal exudate.  Eyes: Conjunctivae and EOM are normal. Pupils are equal, round, and reactive to light. Right eye exhibits no discharge. Left eye exhibits no discharge. No scleral icterus.  Neck: Normal range of motion. Neck supple. No tracheal deviation present. No thyromegaly present.  Cardiovascular: Normal rate, regular rhythm and normal heart sounds.  Exam reveals no gallop and no friction rub.   No murmur heard. Pulmonary/Chest: Effort normal and breath sounds normal. She has no wheezes. She has no rales.  Abdominal: Soft. Bowel sounds are normal. She exhibits no distension and no mass. There is no tenderness. There is no rebound and no guarding.  Musculoskeletal: Normal range of motion. She exhibits no edema.  RLE cool to the touch No UE swelling or erythema  Lymphadenopathy:    She has no cervical adenopathy.  Neurological: She is alert and oriented to person, place, and time. No cranial nerve deficit.  Gait abnormality noted Absent patellar reflex RLE  Skin: Skin is warm and dry. No rash noted.  Psychiatric: Mood, memory, affect and judgment normal.  Nursing note and vitals reviewed.    LABORATORY DATA:  I have reviewed the data as listed Lab Results  Component Value Date   WBC 7.1 07/13/2014   HGB 14.7 07/13/2014   HCT 45.7 07/13/2014   MCV 87.9 07/13/2014   PLT 244 07/13/2014     Chemistry      Component Value Date/Time   NA 142 07/13/2014 1220   NA  140 05/19/2014 0938   K 4.3 07/13/2014 1220   K 3.6 05/19/2014 0938   CL 103 05/19/2014 0938   CO2 30* 07/13/2014 1220   CO2 30 05/19/2014 0938   BUN 12.6 07/13/2014 1220   BUN 20 05/19/2014 0938   CREATININE 0.7 07/13/2014 1220   CREATININE 0.6 05/19/2014 0938      Component Value Date/Time   CALCIUM 9.9 07/13/2014 1220   CALCIUM 9.6 05/19/2014 0938   ALKPHOS 78 07/13/2014 1220   ALKPHOS 65 05/19/2014 0938   AST 17 07/13/2014 1220   AST 17 05/19/2014 0938   ALT 14 07/13/2014 1220   ALT 15 05/19/2014 0938   BILITOT 0.58 07/13/2014 1220   BILITOT 0.6 05/19/2014 0938       PATHOLOGY:  Breast, partial mastectomy, Left - DUCTAL CARCINOMA IN SITU WITH CALCIFICATIONS, INTERMEDIATE GRADE, SPANNING 2.2 CM. - ONE BENIGN INTRAMAMMARY LYMPH NODE (0/1). - THE SURGICAL RESECTION MARGINS ARE NEGATIVE FOR DUCTAL CARCINOMA. Specimen, including laterality: Left breast. Procedure (include lymph node sampling sentinel-non-sentinel): Partial mastectomy. Grade of carcinoma: Intermediate grade. Necrosis: Present. Estimated tumor size: (gross measurement): 2.2 cm. Treatment effect: N/A. Distance to closest margin: Greater than 0.2 cm to all margins. Breast prognostic profile: [phone]. Estrogen receptor: 100%, strong staining intensity. Progesterone receptor: 59%, strong staining intensity. Lymph nodes: None examined. TNM: pTis, pNX. Comments: In additional to the ductal carcinoma in situ with calcifications, there are vascular calcifications as well as foci of adenosis with calcifications. (JBK:ds 08/08/14) Enid Cutter MD Pathologist, Electronic Signature (Case signed 08/08/2014)   ASSESSMENT & PLAN:   DCIS, ER positive, PR positive, left breast  59 year old female with ER positive DCIS of the left breast. She has undergone lumpectomy and now receiving adjuvant radiation. She is doing well. She has been very reluctant to take endocrine therapy, at her last visit we spent a great  deal of time in discussion about risks and benefits of treatment. She was provided with reading information as well. I answered her questions fully today. She would like to try tamoxifen I have written her a prescription and advised her not to start it until she has completed radiation therapy. I advised her that if she  has difficulties with the medication to let us know. I emphasized to her that trying the medication is a good choice and if she cannot tolerate it she can certainly discontinue it. I have tentatively scheduled her for a 2 month follow-up with repeat laboratory studies and an office visit.   Orders Placed This Encounter  Procedures  . CBC with Differential    Standing Status: Future     Number of Occurrences:      Standing Expiration Date: 09/23/2015  . Comprehensive metabolic panel    Standing Status: Future     Number of Occurrences:      Standing Expiration Date: 09/23/2015    All questions were answered. The patient knows to call the clinic with any problems, questions or concerns.   Molli Hazard, MD MD 10/12/2014 9:58 AM

## 2014-10-15 NOTE — Progress Notes (Signed)
Name: Amanda Terrell   MRN: [id number]  Date:  08/26/14   DOB: Jun 20, 1956  Status:outpatient    DIAGNOSIS: No diagnosis found.  CONSENT VERIFIED: yes   SET UP: Patient is setup supine   IMMOBILIZATION:  The following immobilization was used:Custom Moldable Pillow, breast board.   NARRATIVE: Amanda Terrell underwent complex simulation and treatment planning for her boost treatment today.  Her tumor volume was outlined on the planning CT scan. The depth of her cavity was felt to be appropriate for treatment with electrons     9 MeV electrons will be prescribed.   I personally oversaw and approved the construction of a unique block which will be used for beam modification purposes.  A special port plan is requested.

## 2014-10-16 ENCOUNTER — Encounter (HOSPITAL_COMMUNITY): Payer: Self-pay | Admitting: Hematology & Oncology

## 2014-10-25 ENCOUNTER — Telehealth: Payer: Self-pay | Admitting: Adult Health

## 2014-10-25 NOTE — Telephone Encounter (Signed)
Amanda Terrell called me to reschedule her [facility] appointment from 11/04/14 to this Friday, 10/28/14.  Her appt has been rescheduled per her request and she is aware.  She was given instruction on where to check in for this appointment.   She was encouraged to call me with any questions or concerns before her visit here.   Mike Craze, NP [place] [phone]

## 2014-10-27 ENCOUNTER — Encounter: Payer: Self-pay | Admitting: Radiation Oncology

## 2014-10-28 ENCOUNTER — Encounter: Payer: Self-pay | Admitting: Adult Health

## 2014-10-28 ENCOUNTER — Ambulatory Visit (HOSPITAL_BASED_OUTPATIENT_CLINIC_OR_DEPARTMENT_OTHER): Payer: BLUE CROSS/BLUE SHIELD | Admitting: Adult Health

## 2014-10-28 ENCOUNTER — Ambulatory Visit
Admission: RE | Admit: 2014-10-28 | Discharge: 2014-10-28 | Disposition: A | Discharge status: Home / Self Care | Payer: BLUE CROSS/BLUE SHIELD | Source: Ambulatory Visit | Attending: Radiation Oncology | Admitting: Radiation Oncology

## 2014-10-28 VITALS — BP 127/73 | HR 71 | Temp 97.9°F | Resp 16

## 2014-10-28 VITALS — BP 127/73 | HR 71 | Temp 97.9°F | Wt 139.7 lb

## 2014-10-28 DIAGNOSIS — D0512 Intraductal carcinoma in situ of left breast: Secondary | ICD-10-CM | POA: Diagnosis not present

## 2014-10-28 DIAGNOSIS — C50412 Malignant neoplasm of upper-outer quadrant of left female breast: Secondary | ICD-10-CM

## 2014-10-28 DIAGNOSIS — Z170 Estrogen receptor positive status [ER+]: Secondary | ICD-10-CM

## 2014-10-28 HISTORY — DX: Reserved for inherently not codable concepts without codable children: IMO0001

## 2014-10-28 HISTORY — DX: Reserved for concepts with insufficient information to code with codable children: IMO0002

## 2014-10-28 NOTE — Progress Notes (Signed)
CLINIC:  Cancer Survivorship   REASON FOR VISIT:  Routine follow-up post-treatment for a recent history of breast cancer.  BRIEF ONCOLOGIC HISTORY:    Breast cancer of upper-outer quadrant of left female breast   07/05/2014 Breast US Palpable left breast mass; U/S revealed irregular mass in UOQ of left breast with associated pleomorphic calcs. Mass and calcs span 1.5 cm.    07/07/2014 Initial Biopsy Left breast needle core biopsy, 2 o'clock position revealed intermediate grade DCIS with calcs. ER+ (100%), PR+ (59%).    07/07/2014 Initial Diagnosis Breast cancer of upper-outer quadrant of left female breast   07/12/2014 Breast MRI UOQ of left breast with 2.0 x 1.4 x 1.3 cm biopsy-proven ductal carcinoma. No evidence of malignancy elsewhere in either breast. No adenopathy.    08/05/2014 Definitive Surgery Left lumpectomy (Dr. Dalbert Batman) revealed intermediate grade DCIS, spanning 2.2 cm. Negative margins. 1 intramammary LN benign.    09/05/2014 - 10/04/2014 Radiation Therapy Left breast: total radiation dose 42.72 Gy over 21 fractions.  Left breast boost: total radiation dose 10 Gy over 5 fractions.     Anti-estrogen oral therapy Tamoxifen started by Dr. Whitney Muse.  Planned duration of treatment: 5 years.     INTERVAL HISTORY:  Amanda Terrell presents to the Survivorship Clinic today for our initial meeting to review her survivorship care plan detailing her treatment course for breast cancer, as well as monitoring long-term side effects of that treatment, education regarding health maintenance, screening, and overall wellness and health promotion.     Overall, Amanda Terrell reports feeling quite well since completing her radiation therapy one month ago. She reports having some expected skin changes on her right breast due to the radiation therapy, as well as occasional breast pain. She reported 8/10 pain to the RN today at her 60-month follow-up visit with Dr. Pablo Ledger (prior to her appointment with me today).   The majority of her pain is in her back and right leg, due to chronic conditions.  She tells me that she will soon have a nerve ablation to help with the pain, as well as pending bilateral cataract surgeries as well.  Therefore, she has not started taking her Tamoxifen yet, per Dr. Donald Pore instruction, given her pending surgeries/procedures.  Amanda Terrell husband is present with her today in clinic and they report having a wonderful support group of friends, family, and one another.  Despite Amanda Terrell's other medical conditions, she is doing quite well.   REVIEW OF SYSTEMS:  General: Denies fever, chills, unintentional weight loss. Fatigue improving since completing radiation. Cardiac: Denies palpitations, chest pain, and lower extremity edema.  Respiratory: Denies cough, shortness of breath, and dyspnea on exertion.  GI: Denies abdominal pain, diarrhea, nausea, or vomiting.  Endorses occasional constipation r/t pain medications. GU: Denies dysuria, hematuria, vaginal bleeding, vaginal discharge, or vaginal dryness.  Musculoskeletal: Endorses joint and bone pain (chronic issue).  Neuro: Denies headache or recent falls. Walks with a cane.  Skin: Denies rash, pruritis, or open wounds.  Breast: Denies any new nodularity, masses, nipple changes, or nipple discharge.  Psych: Denies depression, anxiety, insomnia, or memory loss.   A 14-point review of systems was completed and was negative, except as noted above.  BRIEF ONCOLOGIC HISTORY:    Breast cancer of upper-outer quadrant of left female breast   07/05/2014 Breast US Palpable left breast mass; U/S revealed irregular mass in UOQ of left breast with associated pleomorphic calcs. Mass and calcs span 1.5 cm.    07/07/2014 Initial  Biopsy Left breast needle core biopsy, 2 o'clock position revealed intermediate grade DCIS with calcs. ER+ (100%), PR+ (59%).    07/07/2014 Initial Diagnosis Breast cancer of upper-outer quadrant of left female breast     07/12/2014 Breast MRI UOQ of left breast with 2.0 x 1.4 x 1.3 cm biopsy-proven ductal carcinoma. No evidence of malignancy elsewhere in either breast. No adenopathy.    08/05/2014 Definitive Surgery Left lumpectomy (Dr. Dalbert Batman) revealed intermediate grade DCIS, spanning 2.2 cm. Negative margins. 1 intramammary LN benign.    09/05/2014 - 10/04/2014 Radiation Therapy Left breast: total radiation dose 42.72 Gy over 21 fractions.  Left breast boost: total radiation dose 10 Gy over 5 fractions.     Anti-estrogen oral therapy Tamoxifen started by Dr. Whitney Muse.  Planned duration of treatment: 5 years.      ONCOLOGY TREATMENT TEAM:  1. Surgeon:  Dr. Dalbert Batman at [facility] 2. Medical Oncologist: Dr. Whitney Muse, [facility])  3. Radiation Oncologist: Dr. Pablo Ledger    PAST MEDICAL/SURGICAL HISTORY:  Past Medical History  Diagnosis Date  . Cellulitis and abscess of unspecified site   . Rosacea   . Other specified disease of hair and hair follicles   . Axillary abscess     left  . Heart murmur   . Scoliosis     adhesive arachnoditis-right side  . Hiatal hernia   . GERD (gastroesophageal reflux disease)   . Internal hemorrhoids   . Diverticulosis   . Esophageal stricture   . Barrett's esophagus   . Neuropathy   . IBS (irritable bowel syndrome)   . Hyperlipidemia   . Mitral valve prolapse   . Reflex sympathetic dystrophy   . Breast cancer of upper-outer quadrant of left female breast 07/11/2014  . Arthritis   . Hepatitis C 1990    interferon treatment  . 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 PONV (postoperative nausea and vomiting)   . 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/05/14-10/04/14    Left Breast DCIS   Past Surgical History  Procedure Laterality Date  . Back surgery  1969, 1989, 2006    due to scoliosis= total 5 back surgeries  . Cesarean section  1976, 1978, 1980, 1984  . Incise and drain abcess      left axillary abscess  . Breast fibroadenoma surgery       bilateral  . Hysteroscopy    . Colonoscopy  2009  . Upper gastrointestinal endoscopy    . Tonsillectomy    . Spinal surgeries    . Spinal fusion    . Breast lumpectomy with radioactive seed localization Left 08/05/2014    Procedure: LEFT PARTICAL MASTECTOMY WITH RADIOACTIVE SEED LOCALIZATION;  Surgeon: Fanny Skates, MD;  Location: [facility];  Service: General;  Laterality: Left;     ALLERGIES:  Allergies  Allergen Reactions  . Acetaminophen Other (See Comments)    Liver sensitivity secondary to h/o hep. c     CURRENT MEDICATIONS:  Outpatient Encounter Prescriptions as of 10/28/2014  Medication Sig Note  . Calcium Carbonate-Vit D-Min (CALCIUM 1200 PO) Take 1,200 mg by mouth.   . cholecalciferol (VITAMIN D) 1000 UNITS tablet Take 1,000 Units by mouth daily. 1000 to 2000 units/day   . diphenhydrAMINE (BENADRYL) 25 mg capsule Take 25 mg by mouth at bedtime as needed.   Marland Kitchen HYDROmorphone (DILAUDID) 4 MG tablet Take 1 tablet by mouth every 6 (six) hours as needed.    . Ibuprofen (MOTRIN PO) Take 400 mg by  mouth every 4 (four) hours.    . Multiple Vitamin (MULTIVITAMIN) tablet Take 1 tablet by mouth daily.   . non-metallic deodorant Jethro Poling) MISC Apply 1 application topically daily as needed.   Marland Kitchen omeprazole (PRILOSEC) 20 MG capsule Take 1 capsule (20 mg total) by mouth 2 (two) times daily before a meal.   . OXcarbazepine (TRILEPTAL) 150 MG tablet  10/28/2014: Received from: External Pharmacy  . polyethylene glycol powder (GLYCOLAX/MIRALAX) powder Dissolve 17 grams (1 capful) in at least 8 ounces water/juice and drink daily.   . tamoxifen (NOLVADEX) 20 MG tablet Take 1 tablet (20 mg total) by mouth daily. (Patient not taking: Reported on 10/28/2014)   . valACYclovir (VALTREX) 500 MG tablet  10/28/2014: Received from: External Pharmacy  . Wound Dressings (RADIAGEL EX) Apply 170 g topically.      ONCOLOGIC FAMILY HISTORY:  1. Father: Glioblastoma 2. Maternal Aunt: Breast cancer,  diagnosed in her 46s.  3. Maternal Aunt: Lung cancer  4. Paternal Grandfather: Melanoma    SOCIAL HISTORY:  Amanda Terrell is married and lives with her husband in [place], [place].  They have 3 adult daughters and 3 granddaughters.  They are looking forward to traveling to [place], [place] next weekend to witness the baptism of one of their granddaughters.  Amanda Terrell denies any current or history of tobacco, alcohol, or illicit drug abuse.     PHYSICAL EXAMINATION:  Vital Signs:   Filed Vitals:   10/28/14 0938  BP: 127/73  Pulse: 71  Temp: 97.9 F (36.6 C)  Resp: 16   General: Well-nourished, well-appearing female in no acute distress.  She is accompanied in clinic by her husband today.   HEENT: Head is atraumatic and normocephalic.  Pupils equal and reactive to light and accomodation. Conjunctivae clear without exudate.  Sclerae anicteric. Oral mucosa is pink, moist, and intact without lesions.  Oropharynx is pink without lesions or erythema.  Lymph: No cervical, supraclavicular, infraclavicular, or axillary lymphadenopathy noted on palpation.  Cardiovascular: Regular rate and rhythm without murmurs, rubs, or gallops. Respiratory: Clear to auscultation bilaterally. Chest expansion symmetric without accessory muscle use on inspiration or expiration.  GI: Abdomen soft and round. No tenderness to palpation. Bowel sounds normoactive in 4 quadrants. No hepatosplenomegaly.   GU: Deferred.  Musculoskeletal: Noted scoliosis on exam, per pt's medical history.  Neuro: No focal deficits. Ambulates with cane.  Psych: Mood and affect normal and appropriate for situation.  Extremities: No cyanosis or clubbing.  1+ RLE/pedal edema. Right leg and foot cold to touch and with ruddy complexion.  Skin: Warm and dry (other than right leg/food as stated above). No open lesions noted.   LABORATORY DATA:  None for this visit.  DIAGNOSTIC IMAGING:  None for this visit.     ASSESSMENT AND  PLAN:   1. History of breast cancer:  Amanda Terrell will follow-up with her medical oncologist, Dr. Whitney Muse at [facility] in [place], [place] at the end of April 2016 with history and physical exam per surveillance protocol.  She has not begun her anti-estrogen therapy with Tamoxifen at the recommendation of Dr. Whitney Muse, due to the multiple pending surgeries/procedures for Amanda Terrell's chronic conditions.  When she begins the Tamoxifen, she was instructed to make Dr. Whitney Muse or myself aware if she begins to experience any side effects of the medication and I could see her back in clinic to help manage those side effects, as needed. Though the incidence is low, there is an associated risk  of endometrial cancer with anti-estrogen therapies like Tamoxifen.  Amanda Terrell was encouraged to contact Dr. Whitney Muse or myself with any vaginal bleeding while taking Tamoxifen. Other side effects of Tamoxifen were again reviewed with her as well. A comprehensive survivorship care plan and treatment summary was reviewed with the patient today detailing her breast cancer diagnosis, treatment course, potential late/long-term effects of treatment, appropriate follow-up care with recommendations for the future, and patient education resources.  A copy of this summary, along with a letter will be sent to the patient's primary care provider, via mail/fax/Epic after today's visit.  Amanda Terrell is welcome to return to the Survivorship Clinic in the future, as needed; no follow-up will be scheduled at this time.    2. Pain: The majority of the pain that Amanda Terrell is experiencing is not related to her recent DCIS diagnosis and treatment, but rather to her comorbidities.  She will have an ablation soon to her right hip to help with the spinal and leg pain she has been experiencing chronically.  She endorses occasional left breast pain which is likely due to her recent surgery and radiation therapy.  We discussed that the type  of breast pain she is experiencing is normal and related to the healing process.  We discussed that chronic breast pain after surgery and radiation are common and expected at this point in her cancer trajectory and treatment. She was encouraged to contact myself or Dr. Whitney Muse if the pain became worse or she noticed any changes in either of her breasts.   3. Bone health:  Given Amanda Terrell's age and history of breast cancer, she is at risk for bone demineralization.  While Tamoxifen has been shown to slightly improve bone mineralization in women, Amanda Terrell's bone health will still be important to consider as a part of her routine health and wellness.  Her most recent DEXA scan was on 08/29/14. Her bone mineral density will be monitored and managed by either Dr. Whitney Muse or her PCP.   In the meantime, she was encouraged to increase her consumption of foods rich in calcium, as well as increase her weight-bearing activities.  She was given education on specific activities to promote bone health.  4. Cancer screening:  Due to Amanda Terrell's history and her age, she should receive screening for skin cancers, colon cancer, and gynecologic cancers.  The information and recommendations are listed on the patient's comprehensive care plan/treatment summary and were reviewed in detail with the patient.    5. Health maintenance and wellness promotion: Amanda Terrell was encouraged to consume 5-7 servings of fruits and vegetables per day. She was also encouraged to engage in moderate to vigorous exercise for 30 minutes per day most days of the week. She was instructed to limit her alcohol consumption and continue to abstain from tobacco use.     6. Support services/counseling: It is not uncommon for this period of the patient's cancer care trajectory to be one of many emotions and stressors.  Amanda Terrell was encouraged to take advantage of our many support services programs, support groups, and/or counseling in coping with  her new life as a cancer survivor after completing anti-cancer treatment.  She was offered support today through active listening and expressive supportive counseling.  She was given information regarding our available services and encouraged to contact me with any questions or for help enrolling in any of our support group/programs.    A total of 45 minutes of face-to-face time was spent  with this patient with greater than 50% of that time in counseling and care-coordination.   Mike Craze, NP Survivorship Program [place] [phone]   Note: PRIMARY [name], [place] [phone]

## 2014-10-28 NOTE — Progress Notes (Signed)
   Department of Radiation Oncology  Phone:  [phone] Fax:        [phone]   Name: Amanda Terrell MRN: [id number]  DOB: 09-Aug-1955  Date: 10/28/2014  Follow Up Visit Note  Diagnosis: Breast cancer of upper-outer quadrant of left female breast   Staging form: Breast, AJCC 7th Edition     Clinical stage from 07/13/2014: Stage 0 (Tis (DCIS), N0, M0) - Unsigned     Pathologic stage from 08/08/2014: Stage Unknown (Tis (DCIS), NX, cM0) - Signed by Seward Grater, MD on 08/15/2014       Staging comments: Staged from lumpectomy specimen by Dr. Lyndon Code.  Breast neoplasm, Tis (DCIS)   Staging form: Breast, AJCC 7th Edition     Pathologic stage from 08/08/2014: Stage Unknown (Tis (DCIS), NX) - Unsigned     Clinical: No stage assigned - Unsigned  Summary and Interval since last radiation: 1 month from 52.72 Gy to the left breast  Interval History: Amanda Terrell presents today for routine followup.  Her skin has healed well. She has not started tamoxifen and is meeting with Dr. Whitney Muse at the end of the month. She has been seen at [facility] regarding her back pain and it sounds like a nerve ablation is planned. She is pleased with her cosmetic result.   Physical Exam:  Filed Vitals:   10/28/14 0937  BP: 127/73  Pulse: 71  Temp: 97.9 F (36.6 C)  Weight: 139 lb 11.2 oz (63.368 kg)   Slight hyperpigmentation of the left breast with volume loss laterally.   IMPRESSION: Amanda Terrell is a 59 y.o. female s/p breast conservation with resolving acute effects of treatment  PLAN:  She is doing well. We discussed the need for follow up every 6 months which she has scheduled.  We discussed the need for yearly mammograms which she can schedule with her OBGYN or with medical oncology. She is pleased right now with her cosmetic outcome. We discussed plastic surgery options for volume equalizing. We discussed the need for sun protection in the treated area.  She can always call me with questions.  I will follow up  with her on an as needed basis.     Thea Silversmith, MD

## 2014-11-04 ENCOUNTER — Ambulatory Visit: Payer: BLUE CROSS/BLUE SHIELD | Admitting: Radiation Oncology

## 2014-11-04 ENCOUNTER — Ambulatory Visit: Payer: BLUE CROSS/BLUE SHIELD | Admitting: Adult Health

## 2014-11-21 ENCOUNTER — Ambulatory Visit (HOSPITAL_COMMUNITY): Payer: BLUE CROSS/BLUE SHIELD | Admitting: Hematology & Oncology

## 2014-11-21 ENCOUNTER — Encounter (HOSPITAL_COMMUNITY): Payer: Self-pay | Admitting: Hematology & Oncology

## 2014-11-21 ENCOUNTER — Encounter (HOSPITAL_COMMUNITY): Payer: BLUE CROSS/BLUE SHIELD | Attending: Hematology & Oncology

## 2014-11-21 ENCOUNTER — Encounter (HOSPITAL_BASED_OUTPATIENT_CLINIC_OR_DEPARTMENT_OTHER): Payer: BLUE CROSS/BLUE SHIELD | Admitting: Hematology & Oncology

## 2014-11-21 VITALS — BP 110/64 | HR 88 | Temp 98.7°F | Resp 16 | Wt 140.7 lb

## 2014-11-21 DIAGNOSIS — D0512 Intraductal carcinoma in situ of left breast: Secondary | ICD-10-CM | POA: Diagnosis not present

## 2014-11-21 DIAGNOSIS — M419 Scoliosis, unspecified: Secondary | ICD-10-CM | POA: Insufficient documentation

## 2014-11-21 DIAGNOSIS — C50412 Malignant neoplasm of upper-outer quadrant of left female breast: Secondary | ICD-10-CM | POA: Insufficient documentation

## 2014-11-21 DIAGNOSIS — Z170 Estrogen receptor positive status [ER+]: Secondary | ICD-10-CM | POA: Diagnosis not present

## 2014-11-21 DIAGNOSIS — Z8619 Personal history of other infectious and parasitic diseases: Secondary | ICD-10-CM | POA: Diagnosis not present

## 2014-11-21 DIAGNOSIS — Z79810 Long term (current) use of selective estrogen receptor modulators (SERMs): Secondary | ICD-10-CM

## 2014-11-21 DIAGNOSIS — M810 Age-related osteoporosis without current pathological fracture: Secondary | ICD-10-CM | POA: Diagnosis not present

## 2014-11-21 LAB — COMPREHENSIVE METABOLIC PANEL
ALBUMIN: 4.5 g/dL (ref 3.5–5.2)
ALT: 18 U/L (ref 0–35)
ANION GAP: 8 (ref 5–15)
AST: 24 U/L (ref 0–37)
Alkaline Phosphatase: 61 U/L (ref 39–117)
BUN: 19 mg/dL (ref 6–23)
CALCIUM: 9.6 mg/dL (ref 8.4–10.5)
CHLORIDE: 102 mmol/L (ref 96–112)
CO2: 32 mmol/L (ref 19–32)
Creatinine, Ser: 0.66 mg/dL (ref 0.50–1.10)
GLUCOSE: 103 mg/dL — AB (ref 70–99)
POTASSIUM: 4.1 mmol/L (ref 3.5–5.1)
Sodium: 142 mmol/L (ref 135–145)
TOTAL PROTEIN: 7.1 g/dL (ref 6.0–8.3)
Total Bilirubin: 0.7 mg/dL (ref 0.3–1.2)

## 2014-11-21 LAB — CBC WITH DIFFERENTIAL/PLATELET
BASOS PCT: 0 % (ref 0–1)
Basophils Absolute: 0 10*3/uL (ref 0.0–0.1)
EOS ABS: 0.1 10*3/uL (ref 0.0–0.7)
Eosinophils Relative: 1 % (ref 0–5)
HCT: 44.4 % (ref 36.0–46.0)
Hemoglobin: 14.5 g/dL (ref 12.0–15.0)
LYMPHS ABS: 1 10*3/uL (ref 0.7–4.0)
Lymphocytes Relative: 17 % (ref 12–46)
MCH: 29.3 pg (ref 26.0–34.0)
MCHC: 32.7 g/dL (ref 30.0–36.0)
MCV: 89.7 fL (ref 78.0–100.0)
Monocytes Absolute: 0.3 10*3/uL (ref 0.1–1.0)
Monocytes Relative: 5 % (ref 3–12)
NEUTROS ABS: 4.3 10*3/uL (ref 1.7–7.7)
Neutrophils Relative %: 77 % (ref 43–77)
Platelets: 224 10*3/uL (ref 150–400)
RBC: 4.95 MIL/uL (ref 3.87–5.11)
RDW: 13.2 % (ref 11.5–15.5)
WBC: 5.7 10*3/uL (ref 4.0–10.5)

## 2014-11-21 NOTE — Progress Notes (Signed)
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 female is here for follow-up of  DCIS of the Left Breast. She has finished radiation therapy on March 7. She notes her course has been fairly good with only some mild redness to the breasts.   She is having a nerve ablation on May 17 at [facility]. She states they are also doing a therapy for her CRP. She has been given a prescription for Trileptal. He does not like taking medications. She was given a prescription for tamoxifen in February. She has not started this yet because of all of the other procedures she is going through. She also notes she is having cataract surgery on May 24 and on May 31.    Breast cancer of upper-outer quadrant of left female breast   07/05/2014 Breast US Palpable left breast mass; U/S revealed irregular mass in UOQ of left breast with associated pleomorphic calcs. Mass and calcs span 1.5 cm.    07/07/2014 Initial Biopsy Left breast needle core biopsy, 2 o'clock position revealed intermediate grade DCIS with calcs. ER+ (100%), PR+ (59%).    07/07/2014 Initial Diagnosis Breast cancer of upper-outer quadrant of left female breast   07/12/2014 Breast MRI UOQ of left breast with 2.0 x 1.4 x 1.3 cm biopsy-proven ductal carcinoma. No evidence of malignancy elsewhere in either breast. No adenopathy.    08/05/2014  Definitive Surgery Left lumpectomy (Dr. Dalbert Batman) revealed intermediate grade DCIS, spanning 2.2 cm. Negative margins. 1 intramammary LN benign.    09/05/2014 - 10/04/2014 Radiation Therapy Left breast: total radiation dose 42.72 Gy over 21 fractions.  Left breast boost: total radiation dose 10 Gy over 5 fractions.     Anti-estrogen oral therapy Tamoxifen started by Dr. Whitney Muse.  Planned duration of treatment: 5 years.    10/28/2014 Survivorship Survivorship Care Plan given and reviewed with patient in-person.      MEDICAL HISTORY:  Past Medical History  Diagnosis Date  . Cellulitis and abscess of unspecified site   . Rosacea   . Other specified disease of hair and hair follicles   . Axillary abscess     left  . Heart murmur   . Scoliosis     adhesive arachnoditis-right side  . Hiatal hernia   . GERD (gastroesophageal reflux disease)   . Internal hemorrhoids   . Diverticulosis   . Esophageal stricture   . Barrett's esophagus   . Neuropathy   . IBS (irritable bowel syndrome)   . Hyperlipidemia   . Mitral valve prolapse   . Reflex sympathetic dystrophy   . Breast cancer of upper-outer quadrant of left female breast 07/11/2014  . Arthritis   . Hepatitis C 1990    interferon treatment  . 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 PONV (postoperative nausea and vomiting)   . 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/05/14-10/04/14    Left Breast DCIS    SURGICAL HISTORY: Past Surgical History  Procedure Laterality Date  .  Back surgery  1969, 1989, 2006    due to scoliosis= total 5 back surgeries  . Cesarean section  1976, 1978, 1980, 1984  . Incise and drain abcess      left axillary abscess  . Breast fibroadenoma surgery      bilateral  . Hysteroscopy    . Colonoscopy  2009  . Upper gastrointestinal endoscopy    . Tonsillectomy    . Spinal surgeries    . Spinal fusion    . Breast lumpectomy with radioactive seed localization Left 08/05/2014    Procedure: LEFT PARTICAL MASTECTOMY WITH RADIOACTIVE SEED  LOCALIZATION;  Surgeon: Fanny Skates, MD;  Location: [facility];  Service: General;  Laterality: Left;    SOCIAL HISTORY: History   Social History  . Marital Status: Married    Spouse Name: N/A  . Number of Children: N/A  . Years of Education: N/A   Occupational History  . Not on file.   Social History Main Topics  . Smoking status: Never Smoker   . Smokeless tobacco: Never Used  . Alcohol Use: 0.0 oz/week    0 Standard drinks or equivalent per week     Comment: 1 glass wine a month  . Drug Use: No  . Sexual Activity: Not on file   Other Topics Concern  . Not on file   Social History Narrative  Born in [place]. Married for 42 years. 3 daughters. 3 grand-daughters. 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 Rare ETOH.  FAMILY HISTORY: Family History  Problem Relation Age of Onset  . Heart disease Mother   . Brain cancer Father     Glioblastoma  . Colon cancer Neg Hx   . Melanoma Paternal Grandmother   . Breast cancer Maternal Aunt     in her 80s  . Lung cancer Maternal Aunt    indicated that her mother is deceased. She indicated that her father is deceased.   Mother deceased 26, from heart disease Father deceased 70, glioblastoma 2 Brothers, healthy.   ALLERGIES:  is allergic to acetaminophen.  MEDICATIONS:  Current Outpatient Prescriptions  Medication Sig Dispense Refill  . Calcium Carbonate-Vit D-Min (CALCIUM 1200 PO) Take 1,200 mg by mouth.    . cholecalciferol (VITAMIN D) 1000 UNITS tablet Take 1,000 Units by mouth daily. 1000 to 2000 units/day    . diphenhydrAMINE (BENADRYL) 25 mg capsule Take 25 mg by mouth at bedtime as needed.    Marland Kitchen HYDROmorphone (DILAUDID) 4 MG tablet Take 1 tablet by mouth every 6 (six) hours as needed.     . Ibuprofen (MOTRIN PO) Take 400 mg by mouth every 4 (four) hours.     . Multiple Vitamin (MULTIVITAMIN) tablet Take 1 tablet by mouth daily.    . non-metallic  deodorant Jethro Poling) MISC Apply 1 application topically daily as needed.    Marland Kitchen omeprazole (PRILOSEC) 20 MG capsule Take 1 capsule (20 mg total) by mouth 2 (two) times daily before a meal. 180 capsule 3  . polyethylene glycol powder (GLYCOLAX/MIRALAX) powder Dissolve 17 grams (1 capful) in at least 8 ounces water/juice and drink daily. 527 g 6  . valACYclovir (VALTREX) 500 MG tablet   4  . Wound Dressings (RADIAGEL EX) Apply 170 g topically.    . OXcarbazepine (TRILEPTAL) 150 MG tablet   0  . tamoxifen (NOLVADEX) 20 MG tablet Take 1 tablet (20 mg total) by mouth daily. (Patient not taking: Reported on 10/28/2014) 30 tablet 3  No current facility-administered medications for this visit.    Review of Systems  HENT: Negative.   Eyes: Negative.   Respiratory: Negative.   Cardiovascular: Negative.   Gastrointestinal: Negative.   Genitourinary: Negative.   Musculoskeletal: Positive for myalgias and joint pain.  Neurological: Positive for tingling, sensory change, focal weakness and weakness.  Endo/Heme/Allergies: Negative.   Psychiatric/Behavioral: Negative.     PHYSICAL EXAMINATION: ECOG PERFORMANCE STATUS: 1 - Symptomatic but completely ambulatory  Filed Vitals:   11/21/14 1319  BP: 110/64  Pulse: 88  Temp: 98.7 F (37.1 C)  Resp: 16   Filed Weights   11/21/14 1319  Weight: 140 lb 11.2 oz (63.821 kg)     Physical Exam  Constitutional: She is oriented to person, place, and time and well-developed, well-nourished, and in no distress.  HENT:  Head: Normocephalic and atraumatic.  Nose: Nose normal.  Mouth/Throat: Oropharynx is clear and moist. No oropharyngeal exudate.  Eyes: Conjunctivae and EOM are normal. Pupils are equal, round, and reactive to light. Right eye exhibits no discharge. Left eye exhibits no discharge. No scleral icterus.  Neck: Normal range of motion. Neck supple. No tracheal deviation present. No thyromegaly present.  Cardiovascular: Normal rate, regular rhythm  and normal heart sounds.  Exam reveals no gallop and no friction rub.   No murmur heard. Pulmonary/Chest: Effort normal and breath sounds normal. She has no wheezes. She has no rales.  Abdominal: Soft. Bowel sounds are normal. She exhibits no distension and no mass. There is no tenderness. There is no rebound and no guarding.  Musculoskeletal: Normal range of motion. She exhibits no edema.  RLE cool to the touch No UE swelling or erythema  Lymphadenopathy:    She has no cervical adenopathy.  Neurological: She is alert and oriented to person, place, and time. No cranial nerve deficit.  Gait abnormality noted Absent patellar reflex RLE  Skin: Skin is warm and dry. No rash noted.  Psychiatric: Mood, memory, affect and judgment normal.  Nursing note and vitals reviewed.    LABORATORY DATA:  I have reviewed the data as listed Lab Results  Component Value Date   WBC 5.7 11/21/2014   HGB 14.5 11/21/2014   HCT 44.4 11/21/2014   MCV 89.7 11/21/2014   PLT 224 11/21/2014     Chemistry      Component Value Date/Time   NA 142 07/13/2014 1220   NA 140 05/19/2014 0938   K 4.3 07/13/2014 1220   K 3.6 05/19/2014 0938   CL 103 05/19/2014 0938   CO2 30* 07/13/2014 1220   CO2 30 05/19/2014 0938   BUN 12.6 07/13/2014 1220   BUN 20 05/19/2014 0938   CREATININE 0.7 07/13/2014 1220   CREATININE 0.6 05/19/2014 0938      Component Value Date/Time   CALCIUM 9.9 07/13/2014 1220   CALCIUM 9.6 05/19/2014 0938   ALKPHOS 78 07/13/2014 1220   ALKPHOS 65 05/19/2014 0938   AST 17 07/13/2014 1220   AST 17 05/19/2014 0938   ALT 14 07/13/2014 1220   ALT 15 05/19/2014 0938   BILITOT 0.58 07/13/2014 1220   BILITOT 0.6 05/19/2014 0938       PATHOLOGY:  Breast, partial mastectomy, Left - DUCTAL CARCINOMA IN SITU WITH CALCIFICATIONS, INTERMEDIATE GRADE, SPANNING 2.2 CM. - ONE BENIGN INTRAMAMMARY LYMPH NODE (0/1). - THE SURGICAL RESECTION MARGINS ARE NEGATIVE FOR DUCTAL CARCINOMA. Specimen,  including laterality: Left breast. Procedure (include lymph node sampling sentinel-non-sentinel): Partial mastectomy. Grade of carcinoma: Intermediate grade. Necrosis: Present.  Estimated tumor size: (gross measurement): 2.2 cm. Treatment effect: N/A. Distance to closest margin: Greater than 0.2 cm to all margins. Breast prognostic profile: [phone]. Estrogen receptor: 100%, strong staining intensity. Progesterone receptor: 59%, strong staining intensity. Lymph nodes: None examined. TNM: pTis, pNX. Comments: In additional to the ductal carcinoma in situ with calcifications, there are vascular calcifications as well as foci of adenosis with calcifications. (JBK:ds 08/08/14) Enid Cutter MD Pathologist, Electronic Signature (Case signed 08/08/2014)   ASSESSMENT & PLAN:   DCIS, ER positive, PR positive, left breast Osteoporosis, having been on bisphosphonates in the past   59 year old female with ER positive DCIS of the left breast. She has undergone lumpectomy and has completed adjuvant radiation. She is doing well. She has been very reluctant to take endocrine therapy, at her last visit we spent a great deal of time in discussion about risks and benefits of treatment. She was provided with reading information as well.Marland Kitchen She would like to try tamoxifen I have written her a prescription and advised her not to start it until she has completed radiation therapy. Today she notes she would like to wait until she has completed all of her upcoming procedures at [facility] and her cataract removal prior to initiating tamoxifen.  She has osteoporosis and has been on bisphosphonates in the past. We discussed Prolia therapy. She is interested in trying treatment with Prolia we will submit this to her insurance her approval. We will get her started once we have insurance approval. I will tentatively plan on seeing her back in 3 months. Should she started her tamoxifen prior to her follow-up of advised  her to call with any problems or concerns.  All questions were answered. The patient knows to call the clinic with any problems, questions or concerns.   Molli Hazard, MD  11/21/2014 2:14 PM

## 2014-11-21 NOTE — Patient Instructions (Signed)
..  [place] at [facility] Discharge Instructions  RECOMMENDATIONS MADE BY THE CONSULTANT AND ANY TEST RESULTS WILL BE SENT TO YOUR REFERRING PHYSICIAN.  Exam and discussion with Dr. Whitney Muse. Continue with plan as scheduled. Return next week for Prolia injection. Return in 3 months to see Dr. Whitney Muse. Call for any new symptoms, questions, or concerns.    Thank you for choosing [place] at [facility] to provide your oncology and hematology care.  To afford each patient quality time with our provider, please arrive at least 15 minutes before your scheduled appointment time.    You need to re-schedule your appointment should you arrive 10 or more minutes late.  We strive to give you quality time with our providers, and arriving late affects you and other patients whose appointments are after yours.  Also, if you no show three or more times for appointments you may be dismissed from the clinic at the providers discretion.     Again, thank you for choosing [facility].  Our hope is that these requests will decrease the amount of time that you wait before being seen by our physicians.       _____________________________________________________________  Should you have questions after your visit to [facility], please contact our office at [phone] between the hours of 8:30 a.m. and 4:30 p.m.  Voicemails left after 4:30 p.m. will not be returned until the following business day.  For prescription refill requests, have your pharmacy contact our office.

## 2014-11-21 NOTE — Progress Notes (Signed)
Amanda Terrell presented for labwork. Labs per MD order drawn via Peripheral Line 23 gauge needle inserted in right AC  Good blood return present. Procedure without incident.  Needle removed intact. Patient tolerated procedure well.

## 2014-11-28 ENCOUNTER — Ambulatory Visit (HOSPITAL_COMMUNITY): Payer: BLUE CROSS/BLUE SHIELD

## 2014-12-29 ENCOUNTER — Telehealth: Payer: Self-pay | Admitting: Internal Medicine

## 2014-12-29 NOTE — Telephone Encounter (Signed)
A user error has taken place.

## 2015-01-11 ENCOUNTER — Telehealth (HOSPITAL_COMMUNITY): Payer: Self-pay | Admitting: *Deleted

## 2015-01-11 NOTE — Telephone Encounter (Signed)
Patient called to let us know that she started her Tamoxifen on 01/10/15. She had to get through a couple of surgeries prior to starting her Tamoxifen therapy.

## 2015-02-20 ENCOUNTER — Encounter (HOSPITAL_COMMUNITY): Payer: BLUE CROSS/BLUE SHIELD | Attending: Hematology & Oncology | Admitting: Hematology & Oncology

## 2015-02-20 ENCOUNTER — Encounter (HOSPITAL_COMMUNITY): Payer: BLUE CROSS/BLUE SHIELD

## 2015-02-20 ENCOUNTER — Encounter (HOSPITAL_COMMUNITY): Payer: Self-pay | Admitting: Hematology & Oncology

## 2015-02-20 VITALS — BP 111/58 | HR 78 | Temp 98.8°F | Resp 16

## 2015-02-20 DIAGNOSIS — M810 Age-related osteoporosis without current pathological fracture: Secondary | ICD-10-CM | POA: Diagnosis not present

## 2015-02-20 DIAGNOSIS — D0510 Intraductal carcinoma in situ of unspecified breast: Secondary | ICD-10-CM

## 2015-02-20 MED ORDER — TAMOXIFEN CITRATE 20 MG PO TABS: 20.0000 mg | ORAL_TABLET | Freq: Every day | ORAL | Status: DC

## 2015-02-20 MED ORDER — ALENDRONATE-CHOLECALCIFEROL 70-2800 MG-UNIT PO TABS: 1.0000 | ORAL_TABLET | ORAL | Status: DC

## 2015-02-20 NOTE — Progress Notes (Signed)
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 female is here for follow-up of  DCIS of the Left Breast. She has finished radiation therapy on March 7.  She had a nerve ablation at [facility] that unfortunately did not help her chronic pain.  The patient has had cataract removal surgery and says that everything is going fine.    She is currently taking Tamoxifen.  Her energy is well.  She denies problems with her bowels, nausea, or vomiting.  She would like to go hiking and participate in other types of outdoor activities. She notes however, that she has accepted that this is not possible. She is able to do pool directed therapy and swim.  She is due for another mammogram in December.    Breast cancer of upper-outer quadrant of left female breast   07/05/2014 Breast US Palpable left breast mass; U/S revealed irregular mass in UOQ of left breast with associated pleomorphic calcs. Mass and calcs span 1.5 cm.    07/07/2014 Initial Biopsy Left breast needle core biopsy, 2 o'clock position revealed intermediate grade DCIS with calcs. ER+ (100%), PR+ (59%).    07/07/2014 Initial Diagnosis Breast cancer of upper-outer quadrant of left female breast   07/12/2014 Breast MRI UOQ of left breast with 2.0 x 1.4 x 1.3 cm biopsy-proven ductal carcinoma. No evidence of  malignancy elsewhere in either breast. No adenopathy.    08/05/2014 Definitive Surgery Left lumpectomy (Dr. Dalbert Batman) revealed intermediate grade DCIS, spanning 2.2 cm. Negative margins. 1 intramammary LN benign.    09/05/2014 - 10/04/2014 Radiation Therapy Left breast: total radiation dose 42.72 Gy over 21 fractions.  Left breast boost: total radiation dose 10 Gy over 5 fractions.     Anti-estrogen oral therapy Tamoxifen started by Dr. Whitney Muse.  Planned duration of treatment: 5 years.    10/28/2014 Survivorship Survivorship Care Plan given and reviewed with patient in-person.      MEDICAL HISTORY:  Past Medical History  Diagnosis Date  . Cellulitis and abscess of unspecified site   . Rosacea   . Other specified disease of hair and hair follicles   . Axillary abscess     left  . Heart murmur   . Scoliosis     adhesive arachnoditis-right side  . Hiatal hernia   . GERD (gastroesophageal reflux disease)   . Internal hemorrhoids   . Diverticulosis   . Esophageal stricture   . Barrett's esophagus   . Neuropathy   . IBS (irritable bowel syndrome)   . Hyperlipidemia   . Mitral valve prolapse   . Reflex sympathetic dystrophy   . Breast cancer of upper-outer quadrant of left female breast 07/11/2014  . Arthritis   . Hepatitis C 1990    interferon treatment  . 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 PONV (postoperative nausea and vomiting)   . 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/05/14-10/04/14    Left Breast DCIS    SURGICAL HISTORY: Past  Surgical History  Procedure Laterality Date  . Back surgery  1969, 1989, 2006    due to scoliosis= total 5 back surgeries  . Cesarean section  1976, 1978, 1980, 1984  . Incise and drain abcess      left axillary abscess  . Breast fibroadenoma surgery      bilateral  . Hysteroscopy    . Colonoscopy  2009  . Upper gastrointestinal endoscopy    . Tonsillectomy    . Spinal surgeries    . Spinal fusion    . Breast lumpectomy with radioactive seed localization Left 08/05/2014     Procedure: LEFT PARTICAL MASTECTOMY WITH RADIOACTIVE SEED LOCALIZATION;  Surgeon: Fanny Skates, MD;  Location: [facility];  Service: General;  Laterality: Left;    SOCIAL HISTORY: History   Social History  . Marital Status: Married    Spouse Name: N/A  . Number of Children: N/A  . Years of Education: N/A   Occupational History  . Not on file.   Social History Main Topics  . Smoking status: Never Smoker   . Smokeless tobacco: Never Used  . Alcohol Use: 0.0 oz/week    0 Standard drinks or equivalent per week     Comment: 1 glass wine a month  . Drug Use: No  . Sexual Activity: Not on file   Other Topics Concern  . Not on file   Social History Narrative  Born in [place]. Married for 42 years. 3 daughters. 3 grand-daughters. 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 Rare ETOH.  FAMILY HISTORY: Family History  Problem Relation Age of Onset  . Heart disease Mother   . Brain cancer Father     Glioblastoma  . Colon cancer Neg Hx   . Melanoma Paternal Grandmother   . Breast cancer Maternal Aunt     in her 21s  . Lung cancer Maternal Aunt    indicated that her mother is deceased. She indicated that her father is deceased.   Mother deceased 72, from heart disease Father deceased 87, glioblastoma 2 Brothers, healthy.   ALLERGIES:  is allergic to acetaminophen.  MEDICATIONS:  Current Outpatient Prescriptions  Medication Sig Dispense Refill  . Calcium Carbonate-Vit D-Min (CALCIUM 1200 PO) Take 1,200 mg by mouth.    . cholecalciferol (VITAMIN D) 1000 UNITS tablet Take 1,000 Units by mouth daily. 1000 to 2000 units/day    . diphenhydrAMINE (BENADRYL) 25 mg capsule Take 25 mg by mouth at bedtime as needed.    Marland Kitchen HYDROmorphone (DILAUDID) 4 MG tablet Take 1 tablet by mouth every 6 (six) hours as needed.     . Ibuprofen (MOTRIN PO) Take 400 mg by mouth every 4 (four) hours.     . Multiple Vitamin  (MULTIVITAMIN) tablet Take 1 tablet by mouth daily.    . non-metallic deodorant Jethro Poling) MISC Apply 1 application topically daily as needed.    Marland Kitchen omeprazole (PRILOSEC) 20 MG capsule Take 1 capsule (20 mg total) by mouth 2 (two) times daily before a meal. 180 capsule 3  . polyethylene glycol powder (GLYCOLAX/MIRALAX) powder Dissolve 17 grams (1 capful) in at least 8 ounces water/juice and drink daily. 527 g 6  . tamoxifen (NOLVADEX) 20 MG tablet Take 1 tablet (20 mg total) by mouth daily. 30 tablet 6  . valACYclovir (VALTREX) 500 MG tablet Take 500 mg by mouth daily.   4  . alendronate-cholecalciferol (FOSAMAX PLUS D) 70-2800 MG-UNIT per tablet Take 1 tablet by  mouth every 7 (seven) days. Take with a full glass of water on an empty stomach. 4 tablet 6  . OXcarbazepine (TRILEPTAL) 150 MG tablet   0  . Wound Dressings (RADIAGEL EX) Apply 170 g topically.     No current facility-administered medications for this visit.    Review of Systems  HENT: Negative.   Eyes: Negative.   Respiratory: Negative.   Cardiovascular: Negative.   Gastrointestinal: Negative.   Genitourinary: Negative.   Musculoskeletal: Positive for myalgias and joint pain.  Neurological: Positive for tingling, sensory change, focal weakness and weakness.  Endo/Heme/Allergies: Negative.   Psychiatric/Behavioral: Negative.   14 point review of systems was performed and is negative except as detailed under history of present illness and above  PHYSICAL EXAMINATION: ECOG PERFORMANCE STATUS: 1 - Symptomatic but completely ambulatory  Filed Vitals:   02/20/15 1328  BP: 111/58  Pulse: 78  Temp: 98.8 F (37.1 C)  Resp: 16   There were no vitals filed for this visit.   Physical Exam  Constitutional: She is oriented to person, place, and time and well-developed, well-nourished, and in no distress.  She uses a cane HENT:  Head: Normocephalic and atraumatic.  Nose: Nose normal.  Mouth/Throat: Oropharynx is clear and moist.  No oropharyngeal exudate.  Eyes: Conjunctivae and EOM are normal. Pupils are equal, round, and reactive to light. Right eye exhibits no discharge. Left eye exhibits no discharge. No scleral icterus.  Neck: Normal range of motion. Neck supple. No tracheal deviation present. No thyromegaly present.  Cardiovascular: Normal rate, regular rhythm and normal heart sounds.  Exam reveals no gallop and no friction rub.   No murmur heard. Pulmonary/Chest: Effort normal and breath sounds normal. She has no wheezes. She has no rales.  Abdominal: Soft. Bowel sounds are normal. She exhibits no distension and no mass. There is no tenderness. There is no rebound and no guarding.  Musculoskeletal: Normal range of motion. She exhibits no edema.  RLE cool to the touch No UE swelling or erythema  Lymphadenopathy:    She has no cervical adenopathy.  Neurological: She is alert and oriented to person, place, and time. No cranial nerve deficit.  Gait abnormality noted Absent patellar reflex RLE  Skin: Skin is warm and dry. No rash noted.  Psychiatric: Mood, memory, affect and judgment normal.  Nursing note and vitals reviewed.   LABORATORY DATA:  I have reviewed the data as listed Lab Results  Component Value Date   WBC 5.7 11/21/2014   HGB 14.5 11/21/2014   HCT 44.4 11/21/2014   MCV 89.7 11/21/2014   PLT 224 11/21/2014     Chemistry      Component Value Date/Time   NA 142 11/21/2014 1330   NA 142 07/13/2014 1220   K 4.1 11/21/2014 1330   K 4.3 07/13/2014 1220   CL 102 11/21/2014 1330   CO2 32 11/21/2014 1330   CO2 30* 07/13/2014 1220   BUN 19 11/21/2014 1330   BUN 12.6 07/13/2014 1220   CREATININE 0.66 11/21/2014 1330   CREATININE 0.7 07/13/2014 1220      Component Value Date/Time   CALCIUM 9.6 11/21/2014 1330   CALCIUM 9.9 07/13/2014 1220   ALKPHOS 61 11/21/2014 1330   ALKPHOS 78 07/13/2014 1220   AST 24 11/21/2014 1330   AST 17 07/13/2014 1220   ALT 18 11/21/2014 1330   ALT 14  07/13/2014 1220   BILITOT 0.7 11/21/2014 1330   BILITOT 0.58 07/13/2014 1220  PATHOLOGY:  Breast, partial mastectomy, Left - DUCTAL CARCINOMA IN SITU WITH CALCIFICATIONS, INTERMEDIATE GRADE, SPANNING 2.2 CM. - ONE BENIGN INTRAMAMMARY LYMPH NODE (0/1). - THE SURGICAL RESECTION MARGINS ARE NEGATIVE FOR DUCTAL CARCINOMA. Specimen, including laterality: Left breast. Procedure (include lymph node sampling sentinel-non-sentinel): Partial mastectomy. Grade of carcinoma: Intermediate grade. Necrosis: Present. Estimated tumor size: (gross measurement): 2.2 cm. Treatment effect: N/A. Distance to closest margin: Greater than 0.2 cm to all margins. Breast prognostic profile: [phone]. Estrogen receptor: 100%, strong staining intensity. Progesterone receptor: 59%, strong staining intensity. Lymph nodes: None examined. TNM: pTis, pNX. Comments: In additional to the ductal carcinoma in situ with calcifications, there are vascular calcifications as well as foci of adenosis with calcifications. (JBK:ds 08/08/14) Enid Cutter MD Pathologist, Electronic Signature (Case signed 08/08/2014)   ASSESSMENT & PLAN:   DCIS, ER positive, PR positive, left breast Osteoporosis  59 year old female with ER positive DCIS of the left breast. She has undergone lumpectomy and has completed adjuvant radiation. She is doing well. She takes her tamoxifen daily and is tolerating it without difficulty.  She will continue forward with tamoxifen therapy.  She has osteoporosis. We discussed prolia therapy but we could not get this approved by her insurance.  She had thought she tried bisphosphonates in the past but thinks she may not have -- we have written a prescription for weekly Fosamax. She is to continue with calcium plus D.  All questions were answered. The patient knows to call the clinic with any problems, questions or concerns.   This document serves as a record of services personally performed by  Ancil Linsey, MD. It was created on her behalf by Janace Hoard, a trained medical scribe. The creation of this record is based on the scribe's personal observations and the provider's statements to them. This document has been checked and approved by the attending provider.  I have reviewed the above documentation for accuracy and completeness, and I agree with the above.  This note was electronically signed.  Kelby Fam. Whitney Muse, MD

## 2015-02-20 NOTE — Patient Instructions (Signed)
[facility] at [facility] Discharge Instructions  RECOMMENDATIONS MADE BY THE CONSULTANT AND ANY TEST RESULTS WILL BE SENT TO YOUR REFERRING PHYSICIAN.  Return to clinic in 3 months for follow up.  Thank you for choosing [place] [place] at [facility] to provide your oncology and hematology care.  To afford each patient quality time with our provider, please arrive at least 15 minutes before your scheduled appointment time.    You need to re-schedule your appointment should you arrive 10 or more minutes late.  We strive to give you quality time with our providers, and arriving late affects you and other patients whose appointments are after yours.  Also, if you no show three or more times for appointments you may be dismissed from the clinic at the providers discretion.     Again, thank you for choosing [facility].  Our hope is that these requests will decrease the amount of time that you wait before being seen by our physicians.       _____________________________________________________________  Should you have questions after your visit to [facility], please contact our office at [phone] between the hours of 8:30 a.m. and 4:30 p.m.  Voicemails left after 4:30 p.m. will not be returned until the following business day.  For prescription refill requests, have your pharmacy contact our office.

## 2015-02-20 NOTE — Progress Notes (Signed)
Please see doctors encounter for more information 

## 2015-04-12 ENCOUNTER — Ambulatory Visit (HOSPITAL_COMMUNITY)
Admission: RE | Admit: 2015-04-12 | Discharge: 2015-04-12 | Disposition: A | Discharge status: Home / Self Care | Payer: BLUE CROSS/BLUE SHIELD | Source: Ambulatory Visit | Attending: Hematology & Oncology | Admitting: Hematology & Oncology

## 2015-04-12 ENCOUNTER — Telehealth (HOSPITAL_COMMUNITY): Payer: Self-pay | Admitting: *Deleted

## 2015-04-12 ENCOUNTER — Other Ambulatory Visit (HOSPITAL_COMMUNITY): Payer: Self-pay | Admitting: *Deleted

## 2015-04-12 ENCOUNTER — Other Ambulatory Visit (HOSPITAL_COMMUNITY): Payer: BLUE CROSS/BLUE SHIELD

## 2015-04-12 DIAGNOSIS — M79662 Pain in left lower leg: Secondary | ICD-10-CM | POA: Diagnosis present

## 2015-04-12 DIAGNOSIS — Z79810 Long term (current) use of selective estrogen receptor modulators (SERMs): Secondary | ICD-10-CM | POA: Insufficient documentation

## 2015-04-12 DIAGNOSIS — C50412 Malignant neoplasm of upper-outer quadrant of left female breast: Secondary | ICD-10-CM

## 2015-04-12 DIAGNOSIS — M79605 Pain in left leg: Secondary | ICD-10-CM

## 2015-04-12 DIAGNOSIS — Z853 Personal history of malignant neoplasm of breast: Secondary | ICD-10-CM | POA: Insufficient documentation

## 2015-04-12 NOTE — Telephone Encounter (Signed)
Patient states that for the last couple of weeks she has had pain in her "good leg" (left leg - starts in outer thigh, behind knee, calf, ankle, bottom of foot) and is a deep achy/throbbing pain. Patient doesn't think that there is any swelling in that leg. Stopped taking Tamoxifen 5 days ago to see if that was causing the pain. Patient hasn't started any other medications ordered for her yet because she wanted to see if the Tamoxifen was going to cause any side effects. Pt to have Left lower extremity ultrasound.

## 2015-05-02 ENCOUNTER — Encounter: Payer: Self-pay | Admitting: Adult Health

## 2015-05-02 NOTE — Progress Notes (Signed)
A birthday card was mailed to the patient today on behalf of the Survivorship Program at [facility].   Gretchen Dawson, NP Survivorship Program [facility] [phone]  

## 2015-05-19 ENCOUNTER — Telehealth (HOSPITAL_COMMUNITY): Payer: Self-pay | Admitting: *Deleted

## 2015-05-19 NOTE — Telephone Encounter (Signed)
Pt called and left a vm stating that she had to stop the Tamoxifen and has been off if it x 2 weeks due to it affecting her good leg. She was having pain and reported that while taking the Tamoxifen - she just didn't feel good. She reported on vm that she has been having therapy and they are trying to get her a leg brace. Patient had questions about other options at this point. I tried to call patient back and couldn't get her. She is due to be seen in office on 05/23/15 by Dr. Whitney Muse @ 10:50. Message left on her vm regarding her upcoming appointment.

## 2015-05-23 ENCOUNTER — Other Ambulatory Visit (HOSPITAL_COMMUNITY): Payer: Self-pay | Admitting: Hematology & Oncology

## 2015-05-23 ENCOUNTER — Encounter (HOSPITAL_COMMUNITY): Payer: BLUE CROSS/BLUE SHIELD | Attending: Hematology & Oncology | Admitting: Hematology & Oncology

## 2015-05-23 VITALS — BP 114/64 | HR 75 | Temp 98.0°F | Resp 20 | Wt 141.2 lb

## 2015-05-23 DIAGNOSIS — M545 Low back pain: Secondary | ICD-10-CM

## 2015-05-23 DIAGNOSIS — IMO0002 Reserved for concepts with insufficient information to code with codable children: Secondary | ICD-10-CM

## 2015-05-23 DIAGNOSIS — M79606 Pain in leg, unspecified: Secondary | ICD-10-CM

## 2015-05-23 DIAGNOSIS — Z23 Encounter for immunization: Secondary | ICD-10-CM | POA: Diagnosis not present

## 2015-05-23 DIAGNOSIS — D0512 Intraductal carcinoma in situ of left breast: Secondary | ICD-10-CM

## 2015-05-23 DIAGNOSIS — C50412 Malignant neoplasm of upper-outer quadrant of left female breast: Secondary | ICD-10-CM

## 2015-05-23 DIAGNOSIS — Z0000 Encounter for general adult medical examination without abnormal findings: Secondary | ICD-10-CM

## 2015-05-23 DIAGNOSIS — M810 Age-related osteoporosis without current pathological fracture: Secondary | ICD-10-CM

## 2015-05-23 DIAGNOSIS — R229 Localized swelling, mass and lump, unspecified: Principal | ICD-10-CM

## 2015-05-23 DIAGNOSIS — G8929 Other chronic pain: Secondary | ICD-10-CM | POA: Diagnosis not present

## 2015-05-23 MED ORDER — INFLUENZA VAC SPLIT QUAD 0.5 ML IM SUSY
0.5000 mL | PREFILLED_SYRINGE | Freq: Once | INTRAMUSCULAR | Status: AC
  Administered 2015-05-23: 0.5 mL via INTRAMUSCULAR

## 2015-05-23 MED ORDER — INFLUENZA VAC SPLIT QUAD 0.5 ML IM SUSY
PREFILLED_SYRINGE | INTRAMUSCULAR | Status: AC
  Filled 2015-05-23: qty 0.5

## 2015-05-23 NOTE — Progress Notes (Signed)
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 female is here for follow-up of  DCIS of the Left Breast. She has finished radiation therapy on March 7.  She had a nerve ablation at [facility] that unfortunately did not help her chronic back/leg pain.    The patient notes that she feels better physically and is unsure how she feels mentally. She decided to discontinue Tamoxifen. She notes she did not feel well on it.   She states that her energy has decreased and has been experiencing cramping.  She has also been tried on several medications for her chronic pain by her physician at the [facility]. She notes that she has a hard time tolerating these as well.   She has osteoporosis and is currently on Fosamax weekly. She takes calcium and Vitamin D . She is doing ok with these medications.   She has been doing integrative therapy.  She notes that she does not stress about her DCIS as much as her leg.  She has not had updated flu vaccination.  Her GI is Dr. Velora Heckler.     Breast cancer of upper-outer quadrant of left female breast ([place])   07/05/2014 Breast US Palpable left breast mass; U/S revealed irregular mass  in UOQ of left breast with associated pleomorphic calcs. Mass and calcs span 1.5 cm.    07/07/2014 Initial Biopsy Left breast needle core biopsy, 2 o'clock position revealed intermediate grade DCIS with calcs. ER+ (100%), PR+ (59%).    07/07/2014 Initial Diagnosis Breast cancer of upper-outer quadrant of left female breast   07/12/2014 Breast MRI UOQ of left breast with 2.0 x 1.4 x 1.3 cm biopsy-proven ductal carcinoma. No evidence of malignancy elsewhere in either breast. No adenopathy.    08/05/2014 Definitive Surgery Left lumpectomy (Dr. Dalbert Batman) revealed intermediate grade DCIS, spanning 2.2 cm. Negative margins. 1 intramammary LN benign.    09/05/2014 - 10/04/2014 Radiation Therapy Left breast: total radiation dose 42.72 Gy over 21 fractions.  Left breast boost: total radiation dose 10 Gy over 5 fractions.     Anti-estrogen oral therapy Tamoxifen started by Dr. Whitney Muse.  Planned duration of treatment: 5 years.    10/28/2014 Survivorship Survivorship Care Plan given and reviewed with patient in-person.      MEDICAL HISTORY:  Past Medical History  Diagnosis Date  . Cellulitis and abscess of unspecified site   . Rosacea   . Other specified disease of hair and hair follicles   . Axillary abscess     left  . Heart murmur   . Scoliosis     adhesive arachnoditis-right side  . Hiatal hernia   . GERD (gastroesophageal reflux disease)   . Internal hemorrhoids   . Diverticulosis   . Esophageal stricture   . Barrett's esophagus   . Neuropathy   . IBS (irritable bowel syndrome)   .  Hyperlipidemia   . Mitral valve prolapse   . Reflex sympathetic dystrophy   . Breast cancer of upper-outer quadrant of left female breast 07/11/2014  . Arthritis   . Hepatitis C 1990    interferon treatment  . 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 PONV (postoperative nausea and vomiting)   . 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/05/14-10/04/14    Left Breast DCIS    SURGICAL HISTORY: Past Surgical History  Procedure Laterality Date   . Back surgery  1969, 1989, 2006    due to scoliosis= total 5 back surgeries  . Cesarean section  1976, 1978, 1980, 1984  . Incise and drain abcess      left axillary abscess  . Breast fibroadenoma surgery      bilateral  . Hysteroscopy    . Colonoscopy  2009  . Upper gastrointestinal endoscopy    . Tonsillectomy    . Spinal surgeries    . Spinal fusion    . Breast lumpectomy with radioactive seed localization Left 08/05/2014    Procedure: LEFT PARTICAL MASTECTOMY WITH RADIOACTIVE SEED LOCALIZATION;  Surgeon: Fanny Skates, MD;  Location: [facility];  Service: General;  Laterality: Left;    SOCIAL HISTORY: Social History   Social History  . Marital Status: Married    Spouse Name: N/A  . Number of Children: N/A  . Years of Education: N/A   Occupational History  . Not on file.   Social History Main Topics  . Smoking status: Never Smoker   . Smokeless tobacco: Never Used  . Alcohol Use: 0.0 oz/week    0 Standard drinks or equivalent per week     Comment: 1 glass wine a month  . Drug Use: No  . Sexual Activity: Not on file   Other Topics Concern  . Not on file   Social History Narrative  Born in [place]. Married for 42 years. 3 daughters. 3 grand-daughters. 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 Rare ETOH.  FAMILY HISTORY: Family History  Problem Relation Age of Onset  . Heart disease Mother   . Brain cancer Father     Glioblastoma  . Colon cancer Neg Hx   . Melanoma Paternal Grandmother   . Breast cancer Maternal Aunt     in her 72s  . Lung cancer Maternal Aunt    indicated that her mother is deceased. She indicated that her father is deceased.   Mother deceased 6, from heart disease Father deceased 29, glioblastoma 2 Brothers, healthy.   ALLERGIES:  is allergic to acetaminophen.  MEDICATIONS:  Current Outpatient Prescriptions  Medication Sig Dispense Refill  . Calcium  Carbonate-Vit D-Min (CALCIUM 1200 PO) Take 1,200 mg by mouth.    . cholecalciferol (VITAMIN D) 1000 UNITS tablet Take 1,000 Units by mouth daily. 1000 to 2000 units/day    . diphenhydrAMINE (BENADRYL) 25 mg capsule Take 25 mg by mouth at bedtime as needed.    Marland Kitchen HYDROmorphone (DILAUDID) 4 MG tablet Take 1 tablet by mouth every 6 (six) hours as needed.     . Ibuprofen (MOTRIN PO) Take 400 mg by mouth every 4 (four) hours.     . levETIRAcetam (KEPPRA) 500 MG tablet Take 500 mg by mouth. Take once a day for one week then increase to 1 tablet twice a day thereafter.    . Multiple Vitamin (MULTIVITAMIN) tablet Take 1 tablet by mouth daily.    . polyethylene glycol powder (GLYCOLAX/MIRALAX) powder Dissolve  17 grams (1 capful) in at least 8 ounces water/juice and drink daily. 527 g 6  . alendronate-cholecalciferol (FOSAMAX PLUS D) 70-2800 MG-UNIT per tablet Take 1 tablet by mouth every 7 (seven) days. Take with a full glass of water on an empty stomach. (Patient not taking: Reported on 05/23/2015) 4 tablet 6  . omeprazole (PRILOSEC) 20 MG capsule Take 1 capsule (20 mg total) by mouth 2 (two) times daily before a meal. (Patient not taking: Reported on 05/23/2015) 180 capsule 3  . tamoxifen (NOLVADEX) 20 MG tablet Take 1 tablet (20 mg total) by mouth daily. (Patient not taking: Reported on 05/23/2015) 30 tablet 6   No current facility-administered medications for this visit.    Review of Systems  HENT: Negative.   Eyes: Negative.   Respiratory: Negative.   Cardiovascular: Negative.   Gastrointestinal: Negative.   Genitourinary: Negative.   Musculoskeletal: Positive for myalgias and joint pain.  Neurological: Positive for tingling, sensory change, focal weakness and weakness.  Endo/Heme/Allergies: Negative.   Psychiatric/Behavioral: Negative.   14 point review of systems was performed and is negative except as detailed under history of present illness and above  PHYSICAL EXAMINATION: ECOG  PERFORMANCE STATUS: 1 - Symptomatic but completely ambulatory  Filed Vitals:   05/23/15 1103  BP: 114/64  Pulse: 75  Temp: 98 F (36.7 C)  Resp: 20   Filed Weights   05/23/15 1103  Weight: 141 lb 3.2 oz (64.048 kg)    Physical Exam  Constitutional: She is oriented to person, place, and time and well-developed, well-nourished, and in no distress.  She uses a cane HENT:  Head: Normocephalic and atraumatic.  Nose: Nose normal.  Mouth/Throat: Oropharynx is clear and moist. No oropharyngeal exudate.  Eyes: Conjunctivae and EOM are normal. Pupils are equal, round, and reactive to light. Right eye exhibits no discharge. Left eye exhibits no discharge. No scleral icterus.  Neck: Normal range of motion. Neck supple. No tracheal deviation present. No thyromegaly present.  Cardiovascular: Normal rate, regular rhythm and normal heart sounds.  Exam reveals no gallop and no friction rub.   No murmur heard. Pulmonary/Chest: Effort normal and breath sounds normal. She has no wheezes. She has no rales.  Abdominal: Soft. Bowel sounds are normal. She exhibits no distension and no mass. There is no tenderness. There is no rebound and no guarding.  Musculoskeletal: Normal range of motion. She exhibits no edema.  RLE cool to the touch No UE swelling or erythema  Lymphadenopathy:    She has no cervical adenopathy.  Neurological: She is alert and oriented to person, place, and time. No cranial nerve deficit.  Gait abnormality noted Absent patellar reflex RLE  Skin: Skin is warm and dry. No rash noted.  Psychiatric: Mood, memory, affect and judgment normal.  Nursing note and vitals reviewed.   LABORATORY DATA:  I have reviewed the data as listed Lab Results  Component Value Date   WBC 5.7 11/21/2014   HGB 14.5 11/21/2014   HCT 44.4 11/21/2014   MCV 89.7 11/21/2014   PLT 224 11/21/2014     Chemistry      Component Value Date/Time   NA 142 11/21/2014 1330   NA 142 07/13/2014 1220   K 4.1  11/21/2014 1330   K 4.3 07/13/2014 1220   CL 102 11/21/2014 1330   CO2 32 11/21/2014 1330   CO2 30* 07/13/2014 1220   BUN 19 11/21/2014 1330   BUN 12.6 07/13/2014 1220   CREATININE 0.66 11/21/2014 1330  CREATININE 0.7 07/13/2014 1220      Component Value Date/Time   CALCIUM 9.6 11/21/2014 1330   CALCIUM 9.9 07/13/2014 1220   ALKPHOS 61 11/21/2014 1330   ALKPHOS 78 07/13/2014 1220   AST 24 11/21/2014 1330   AST 17 07/13/2014 1220   ALT 18 11/21/2014 1330   ALT 14 07/13/2014 1220   BILITOT 0.7 11/21/2014 1330   BILITOT 0.58 07/13/2014 1220      PATHOLOGY:  Breast, partial mastectomy, Left - DUCTAL CARCINOMA IN SITU WITH CALCIFICATIONS, INTERMEDIATE GRADE, SPANNING 2.2 CM. - ONE BENIGN INTRAMAMMARY LYMPH NODE (0/1). - THE SURGICAL RESECTION MARGINS ARE NEGATIVE FOR DUCTAL CARCINOMA. Specimen, including laterality: Left breast. Procedure (include lymph node sampling sentinel-non-sentinel): Partial mastectomy. Grade of carcinoma: Intermediate grade. Necrosis: Present. Estimated tumor size: (gross measurement): 2.2 cm. Treatment effect: N/A. Distance to closest margin: Greater than 0.2 cm to all margins. Breast prognostic profile: [phone]. Estrogen receptor: 100%, strong staining intensity. Progesterone receptor: 59%, strong staining intensity. Lymph nodes: None examined. TNM: pTis, pNX. Comments: In additional to the ductal carcinoma in situ with calcifications, there are vascular calcifications as well as foci of adenosis with calcifications. (JBK:ds 08/08/14) Enid Cutter MD Pathologist, Electronic Signature (Case signed 08/08/2014)   ASSESSMENT & PLAN:   DCIS, ER positive, PR positive, left breast Osteoporosis  59 year old female with ER positive DCIS of the left breast. She has undergone lumpectomy and has completed adjuvant radiation. She is doing well. She tried tamoxifen but opted to discontinue it because she did "not feel well on it." I have encouraged  her to stay active, continue with screening mammograms and routine physical exams.  She has osteoporosis. We discussed prolia therapy but we could not get this approved by her insurance.  She had thought she tried bisphosphonates in the past but thinks she may not have -- she is on weekly fosamax, calcium and vitamin D. She will be due for mammography in December and we will order this.  She needs a GI evaluation and would like to be referred locally to Dr. Laural Golden.   All questions were answered. The patient knows to call the clinic with any problems, questions or concerns.   This document serves as a record of services personally performed by Ancil Linsey, MD. It was created on her behalf by Janace Hoard, a trained medical scribe. The creation of this record is based on the scribe's personal observations and the provider's statements to them. This document has been checked and approved by the attending provider.  I have reviewed the above documentation for accuracy and completeness, and I agree with the above.  This note was electronically signed.  Kelby Fam. Whitney Muse, MD

## 2015-05-23 NOTE — Progress Notes (Signed)
Shelton Silvas presents today for injection per MD orders. Flu vaccine administered IM in right deltoid. Administration without incident. Patient tolerated well.

## 2015-05-23 NOTE — Patient Instructions (Addendum)
[place] at [facility] Discharge Instructions  RECOMMENDATIONS MADE BY THE CONSULTANT AND ANY TEST RESULTS WILL BE SENT TO YOUR REFERRING PHYSICIAN.  Exam and discussion by Dr. Whitney Muse. Stop the tamoxifen Start Fosamax Flu Vaccine today Mammogram to be scheduled Will make a referral to Dr. Laural Golden for your history of Barret's esophagus. Call with any concerns.   Thank you for choosing [place] [facility] to provide your oncology and hematology care.  To afford each patient quality time with our provider, please arrive at least 15 minutes before your scheduled appointment time.    You need to re-schedule your appointment should you arrive 10 or more minutes late.  We strive to give you quality time with our providers, and arriving late affects you and other patients whose appointments are after yours.  Also, if you no show three or more times for appointments you may be dismissed from the clinic at the providers discretion.     Again, thank you for choosing [facility].  Our hope is that these requests will decrease the amount of time that you wait before being seen by our physicians.       _____________________________________________________________  Should you have questions after your visit to [facility], please contact our office at [phone] between the hours of 8:30 a.m. and 4:30 p.m.  Voicemails left after 4:30 p.m. will not be returned until the following business day.  For prescription refill requests, have your pharmacy contact our office.

## 2015-05-30 ENCOUNTER — Telehealth: Payer: Self-pay | Admitting: Gastroenterology

## 2015-05-30 NOTE — Telephone Encounter (Signed)
Left a message for patient to call back. 

## 2015-05-31 NOTE — Telephone Encounter (Signed)
Spoke with patient and Amanda Terrell usually has labs around this time. Amanda Terrell would like to see Dr. Havery Moros. Amanda Terrell also is due for EGD also. Amanda Terrell is anxious about having this done. Please, advise.

## 2015-05-31 NOTE — Telephone Encounter (Signed)
I have not met her before, new patient to me. I would like to see her in clinic for routine office visit to discuss need for follow up EGD. Thanks

## 2015-05-31 NOTE — Telephone Encounter (Signed)
Scheduled patient on 07/11/15 at 2:15 PM. Patient aware.

## 2015-06-24 ENCOUNTER — Encounter (HOSPITAL_COMMUNITY): Payer: Self-pay | Admitting: Hematology & Oncology

## 2015-07-06 ENCOUNTER — Other Ambulatory Visit: Payer: Self-pay

## 2015-07-06 MED ORDER — POLYETHYLENE GLYCOL 3350 17 GM/SCOOP PO POWD: ORAL | Status: DC

## 2015-07-11 ENCOUNTER — Ambulatory Visit: Payer: BLUE CROSS/BLUE SHIELD | Admitting: Gastroenterology

## 2015-07-11 ENCOUNTER — Ambulatory Visit (INDEPENDENT_AMBULATORY_CARE_PROVIDER_SITE_OTHER): Payer: BLUE CROSS/BLUE SHIELD | Admitting: Gastroenterology

## 2015-07-11 ENCOUNTER — Ambulatory Visit (HOSPITAL_COMMUNITY)
Admission: RE | Admit: 2015-07-11 | Discharge: 2015-07-11 | Disposition: A | Discharge status: Home / Self Care | Payer: BLUE CROSS/BLUE SHIELD | Source: Ambulatory Visit | Attending: Hematology & Oncology | Admitting: Hematology & Oncology

## 2015-07-11 ENCOUNTER — Encounter: Payer: Self-pay | Admitting: Gastroenterology

## 2015-07-11 VITALS — BP 108/66 | HR 76 | Ht 64.0 in | Wt 135.0 lb

## 2015-07-11 DIAGNOSIS — K219 Gastro-esophageal reflux disease without esophagitis: Secondary | ICD-10-CM

## 2015-07-11 DIAGNOSIS — K5900 Constipation, unspecified: Secondary | ICD-10-CM

## 2015-07-11 DIAGNOSIS — Z8619 Personal history of other infectious and parasitic diseases: Secondary | ICD-10-CM

## 2015-07-11 DIAGNOSIS — R229 Localized swelling, mass and lump, unspecified: Secondary | ICD-10-CM

## 2015-07-11 DIAGNOSIS — Z853 Personal history of malignant neoplasm of breast: Secondary | ICD-10-CM | POA: Insufficient documentation

## 2015-07-11 DIAGNOSIS — IMO0002 Reserved for concepts with insufficient information to code with codable children: Secondary | ICD-10-CM

## 2015-07-11 DIAGNOSIS — R1310 Dysphagia, unspecified: Secondary | ICD-10-CM | POA: Diagnosis not present

## 2015-07-11 MED ORDER — POLYETHYLENE GLYCOL 3350 17 GM/SCOOP PO POWD
ORAL | Status: DC
Start: 2015-07-11 — End: 2015-07-12

## 2015-07-11 NOTE — Patient Instructions (Signed)
You have been scheduled for an endoscopy. Please follow written instructions given to you at your visit today. If you use inhalers (even only as needed), please bring them with you on the day of your procedure.   We have sent the following medications to your pharmacy for you to pick up at your convenience:  Omeprazole  

## 2015-07-11 NOTE — Progress Notes (Signed)
HPI :  59 y/o female here for follow up, former patient of Dr. Olevia Perches, new to me.   She has a history of hepatitis C and she was treated with IFN and ribavirin she reports she has been "cleared of hepatitis C for 14-15 years" with eradication. She reports she has done well since that time. She denies a history of cirrhosis.   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 She is followed by pain management for this issue. She takes ibuprofen 400mg  every 4 hours. She is on omeprazole 20mg  daily for prophylaxis. She reports ibuprofen helps her back pain.   She reports she has some periodic dysphagia, food gets stuck with swallows, with solid foods. No dysphagia to liquids. Depending on what she eats she will have food get stuck. She has periodic heartburn but not significant. She tries to watch her diet to minimize symptoms. She denies much odynophagia. She has had multiple EGDs with dilations of the benign Shatski strictures. She thinks she has had benefit from this in the past. She also has a reported history of BArrett's on an EGD in 2007 but the last 3 EGDs have not shown Barrett's.   She has otherwise been diagnosed with  Breast cancer and followed by oncology for this, she was diagnosed a year ago and had surgery, radiation, and was on tamoxifen but stopped it. She is otherwise asking for a refill of miralax, working well for constipation.   Endoscopic history: EGD 05/2012 - no Barrett's, irregular zline EGD 04/2009 - distal  Esophageal stricture, no Barrett's EGD 04/2007 - 1cm suspected Barrett's however path negative for Barrett's, dilated to 48Fr EGD 04/2006 -  GEJ stricture, biopsies show (+) nondysplastic Barrett's, short segment 1cm or less Colonoscopy 01/2008 - hemorrhoids, diverticulosis, no polyps    Past Medical History  Diagnosis Date  . Cellulitis and abscess of unspecified site   . Rosacea   . Other specified disease of hair and  hair follicles   . Axillary abscess     left  . Heart murmur   . Scoliosis     adhesive arachnoditis-right side  . Hiatal hernia   . GERD (gastroesophageal reflux disease)   . Internal hemorrhoids   . Diverticulosis   . Esophageal stricture   . Barrett's esophagus   . Neuropathy ([place])   . IBS (irritable bowel syndrome)   . Hyperlipidemia   . Mitral valve prolapse   . Reflex sympathetic dystrophy   . Breast cancer of upper-outer quadrant of left female breast ([place]) 07/11/2014  . Arthritis   . Hepatitis C 1990    interferon treatment  . 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 PONV (postoperative nausea and vomiting)   . 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 Back surgery  1969, 1989, 2006    due to scoliosis= total 5 back surgeries  . Cesarean section  1976, 1978, 1980, 1984  . Incise and drain abcess      left axillary abscess  . Breast fibroadenoma surgery      bilateral  . Hysteroscopy    . Colonoscopy  2009  . Upper gastrointestinal endoscopy    . Tonsillectomy    . Spinal surgeries    . Spinal fusion    . Breast lumpectomy with radioactive seed localization Left 08/05/2014    Procedure: LEFT PARTICAL MASTECTOMY WITH  RADIOACTIVE SEED LOCALIZATION;  Surgeon: Fanny Skates, MD;  Location: [facility];  Service: General;  Laterality: Left;   Family History  Problem Relation Age of Onset  . Heart disease Mother   . Brain cancer Father     Glioblastoma  . Colon cancer Neg Hx   . Melanoma Paternal Grandmother   . Breast cancer Maternal Aunt     in her 36s  . Lung cancer Maternal Aunt    Social History  Substance Use Topics  . Smoking status: Never Smoker   . Smokeless tobacco: Never Used  . Alcohol Use: 0.0 oz/week    0 Standard drinks or equivalent per week     Comment: 1 glass wine a month   Current Outpatient Prescriptions  Medication Sig Dispense Refill  .  alendronate-cholecalciferol (FOSAMAX PLUS D) 70-2800 MG-UNIT per tablet Take 1 tablet by mouth every 7 (seven) days. Take with a full glass of water on an empty stomach. 4 tablet 6  . Calcium Carbonate-Vit D-Min (CALCIUM 1200 PO) Take 1,200 mg by mouth.    . cholecalciferol (VITAMIN D) 1000 UNITS tablet Take 1,000 Units by mouth daily. 1000 to 2000 units/day    . diphenhydrAMINE (BENADRYL) 25 mg capsule Take 25 mg by mouth at bedtime as needed.    Marland Kitchen HYDROmorphone (DILAUDID) 4 MG tablet Take 1 tablet by mouth every 6 (six) hours as needed.     . Ibuprofen (MOTRIN PO) Take 400 mg by mouth every 4 (four) hours.     . levETIRAcetam (KEPPRA) 500 MG tablet Take 500 mg by mouth. Take once a day for one week then increase to 1 tablet twice a day thereafter.    . Multiple Vitamin (MULTIVITAMIN) tablet Take 1 tablet by mouth daily.    Marland Kitchen omeprazole (PRILOSEC) 20 MG capsule Take 1 capsule (20 mg total) by mouth 2 (two) times daily before a meal. 180 capsule 3  . polyethylene glycol powder (GLYCOLAX/MIRALAX) powder Dissolve 17 grams (1 capful) in at least 8 ounces water/juice and drink daily. 527 g 1   No current facility-administered medications for this visit.   Allergies  Allergen Reactions  . Acetaminophen Other (See Comments)    Liver sensitivity secondary to h/o hep. c     Review of Systems: All systems reviewed and negative except where noted in HPI.   Lab Results  Component Value Date   WBC 5.7 11/21/2014   HGB 14.5 11/21/2014   HCT 44.4 11/21/2014   MCV 89.7 11/21/2014   PLT 224 11/21/2014    Lab Results  Component Value Date   ALT 18 11/21/2014   AST 24 11/21/2014   ALKPHOS 61 11/21/2014   BILITOT 0.7 11/21/2014     Mm Diag Breast Tomo Bilateral  07/11/2015  CLINICAL DATA:  History of treated left breast cancer, status post lumpectomy in January 2016. EXAM: DIGITAL DIAGNOSTIC BILATERAL MAMMOGRAM WITH 3D TOMOSYNTHESIS AND CAD COMPARISON:  Previous exam(s). ACR Breast Density  Category c: The breast tissue is heterogeneously dense, which may obscure small masses. FINDINGS: There are no suspicious masses, areas of nonsurgical architectural distortion or microcalcifications in either breast. Expected post lumpectomy changes are seen in the left breast upper outer quadrant, far posterior depth. There are scattered bilateral stable calcifications. Mammographic images were processed with CAD. IMPRESSION: No mammographic evidence of malignancy, status post left lumpectomy. RECOMMENDATION: Diagnostic mammogram is suggested in 1 year. (Code:DM-B-01Y) I have discussed the findings and recommendations with the patient. Results were also provided in  writing at the conclusion of the visit. If applicable, a reminder letter will be sent to the patient regarding the next appointment. BI-RADS CATEGORY  2: Benign. Electronically Signed   By: Fidela Salisbury M.D.   On: 07/11/2015 09:47   CT abdomen 2011 - normal liver  Physical Exam: BP 108/66 mmHg  Pulse 76  Ht 5\' 4"  (1.626 m)  Wt 135 lb (61.236 kg)  BMI 23.16 kg/m2 Constitutional: Pleasant,well-developed, female in no acute distress. HEENT: Normocephalic and atraumatic. Conjunctivae are normal. No scleral icterus. Neck supple.  Cardiovascular: Normal rate, regular rhythm.  Pulmonary/chest: Effort normal and breath sounds normal. No wheezing, rales or rhonchi. Abdominal: Soft, nondistended, nontender. Bowel sounds active throughout. There are no masses palpable. No hepatomegaly. Extremities: no edema Lymphadenopathy: No cervical adenopathy noted. Neurological: Alert and oriented to person place and time. Skin: Skin is warm and dry. No rashes noted. Psychiatric: Normal mood and affect. Behavior is normal.   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 Issues as outlined below.   Dysphagia - she has a history of benign  esophageal strictures at the GEJ. She has had dilations in the past which have provided benefit to her symptoms. She is having some recurrence of solid food dysphagia. Recommend EGD with dilation to further evaluate and treat. The indications, risks, and benefits of EGD were explained to the patient in detail. Risks include but are not limited to bleeding, perforation, adverse reaction to medications, and cardiopulmonary compromise. Sequelae include but are not limited to the possibility of surgery, hospitalization, and mortality. The patient verbalized understanding and wished to proceed. All questions answered, referred to scheduler. Further recommendations pending results of the exam.   GERD / history of Barrett's - patient will continue present dose of omeprazole, working well to control her heartburn for the most part, and for prophylaxis given high dose ibuprofen she takes for chronic pain. I otherwise reviewed her prior EGDs. She has had one EGD I can see that had nondysplastic Barrett's. The last 3 EGDs have not shown this on pathology. I discussed Barrett's with her and the low risk of malignancy associated with it. Based on her last 3 exams I don't think she has it and provided reassured, I don't think she warrants further surveillance endoscopy for this issue. That being said, she is having an EGD for dysphagia, will reassess to ensure no Barrett's at that time.   History of HCV - eradicated several years ago. No evidence of cirrhosis. Normal labs in April and CT in 2011 showed a normal liver. Would monitor labs yearly at this time, consider repeat US if changes noted.   Refilled miralax for chronic constipation, working well for her.    Cellar, MD [facility] Pager [phone]

## 2015-07-12 ENCOUNTER — Encounter: Payer: Self-pay | Admitting: Gastroenterology

## 2015-07-12 ENCOUNTER — Ambulatory Visit (AMBULATORY_SURGERY_CENTER): Payer: BLUE CROSS/BLUE SHIELD | Admitting: Gastroenterology

## 2015-07-12 VITALS — BP 125/65 | HR 69 | Temp 96.4°F | Resp 19 | Ht 64.0 in | Wt 135.0 lb

## 2015-07-12 DIAGNOSIS — R1310 Dysphagia, unspecified: Secondary | ICD-10-CM | POA: Diagnosis not present

## 2015-07-12 DIAGNOSIS — K2970 Gastritis, unspecified, without bleeding: Secondary | ICD-10-CM | POA: Diagnosis not present

## 2015-07-12 DIAGNOSIS — K317 Polyp of stomach and duodenum: Secondary | ICD-10-CM | POA: Diagnosis not present

## 2015-07-12 MED ORDER — SODIUM CHLORIDE 0.9 % IV SOLN: 500.0000 mL | INTRAVENOUS | Status: DC

## 2015-07-12 NOTE — Progress Notes (Signed)
Stable to RR 

## 2015-07-12 NOTE — Progress Notes (Signed)
Called to room to assist during endoscopic procedure.  Patient ID and intended procedure confirmed with present staff. Received instructions for my participation in the procedure from the performing physician.  

## 2015-07-12 NOTE — Patient Instructions (Addendum)
YOU HAD AN ENDOSCOPIC PROCEDURE TODAY AT [facility]:   Refer to the procedure report that was given to you for any specific questions about what was found during the examination.  If the procedure report does not answer your questions, please call your gastroenterologist to clarify.  If you requested that your care partner not be given the details of your procedure findings, then the procedure report has been included in a sealed envelope for you to review at your convenience later.  YOU SHOULD EXPECT: Some feelings of bloating in the abdomen. Passage of more gas than usual.  Walking can help get rid of the air that was put into your GI tract during the procedure and reduce the bloating. If you had a lower endoscopy (such as a colonoscopy or flexible sigmoidoscopy) you may notice spotting of blood in your stool or on the toilet paper. If you underwent a bowel prep for your procedure, you may not have a normal bowel movement for a few days.  Please Note:  You might notice some irritation and congestion in your nose or some drainage.  This is from the oxygen used during your procedure.  There is no need for concern and it should clear up in a day or so.  SYMPTOMS TO REPORT IMMEDIATELY:   Following upper endoscopy (EGD)  Vomiting of blood or coffee ground material  New chest pain or pain under the shoulder blades  Painful or persistently difficult swallowing  New shortness of breath  Fever of 100F or higher  Black, tarry-looking stools  For urgent or emergent issues, a gastroenterologist can be reached at any hour by calling [phone].   DIET: Your first meal following the procedure should be a small meal and then it is ok to progress to your normal diet. Heavy or fried foods are harder to digest and may make you feel nauseous or bloated.  Likewise, meals heavy in dairy and vegetables can increase bloating.  Drink plenty of fluids but you should avoid alcoholic beverages for  24 hours.  Hiatal hernia information given.  Gastritis information given.  ACTIVITY:  You should plan to take it easy for the rest of today and you should NOT DRIVE or use heavy machinery until tomorrow (because of the sedation medicines used during the test).    FOLLOW UP: Our staff will call the number listed on your records the next business day following your procedure to check on you and address any questions or concerns that you may have regarding the information given to you following your procedure. If we do not reach you, we will leave a message.  However, if you are feeling well and you are not experiencing any problems, there is no need to return our call.  We will assume that you have returned to your regular daily activities without incident.  If any biopsies were taken you will be contacted by phone or by letter within the next 1-3 weeks.  Please call us at [phone] if you have not heard about the biopsies in 3 weeks.    SIGNATURES/CONFIDENTIALITY: You and/or your care partner have signed paperwork which will be entered into your electronic medical record.  These signatures attest to the fact that that the information above on your After Visit Summary has been reviewed and is understood.  Full responsibility of the confidentiality of this discharge information lies with you and/or your care-partner.  Hiatal hernia, after dilation diet and gastritis information given.

## 2015-07-13 ENCOUNTER — Telehealth: Payer: Self-pay | Admitting: *Deleted

## 2015-07-13 NOTE — Telephone Encounter (Signed)
  Follow up Call-  Call back number 07/12/2015  Post procedure Call Back phone  # [phone]  Permission to leave phone message Yes     Patient questions:  Do you have a fever, pain , or abdominal swelling? No. Pain Score  0 *  Have you tolerated food without any problems? Yes.    Have you been able to return to your normal activities? Yes.    Do you have any questions about your discharge instructions: Diet   No. Medications  No. Follow up visit  No.  Do you have questions or concerns about your Care? No.  Actions: * If pain score is 4 or above: No action needed, pain <4.

## 2015-07-13 NOTE — Op Note (Signed)
[place]  [address]. [place], [id number]   ENDOSCOPY PROCEDURE REPORT  PATIENT: Amanda, Terrell  MR#: [id number] BIRTHDATE: 11-05-1955 , 59  yrs. old GENDER: female ENDOSCOPIST: Yetta Flock, MD REFERRED BY: PROCEDURE DATE:  07/12/2015 PROCEDURE:  EGD w/ balloon dilation and EGD w/ biopsy ASA CLASS:     Class II INDICATIONS:  dysphagia. MEDICATIONS: Propofol 200 mg IV TOPICAL ANESTHETIC:  DESCRIPTION OF PROCEDURE: After the risks benefits and alternatives of the procedure were thoroughly explained, informed consent was obtained.  The [id number] endoscope was introduced through the mouth and advanced to the second portion of the duodenum , Without limitations.  The instrument was slowly withdrawn as the mucosa was fully examined.        FINDINGS: The esophagus was normal in appearance.  There was a mild nonobstructive Shatski ring noted at the GEJ.  There was a small few mm island of salmon colored mucosa on the ring without nodularity, but the ring was otherwise benign appearing.  A TTS balloon was placed and dilated to 4mm, 38mm, and then 53mm without a mucosal wrent appreciated.  Biopsies of the ring were taken in 4 quadrants in attempt to disrupt the ring.  The [facility] was noted 43cm from the incisors, with GEJ and SCJ located 40cm from the incisors, with a 3cm hiatal hernia.  The stomach was remarkable for some antral erythema without focal ulceration, suspect due to NSAID changes but biopsies taken from the antrum and body to rule out H pylori in light of long term need for NSAIDs.  There was a diminutive gastric body polyp removed with cold forceps.  Small gastric diverticulum was noted in the gastric fundus.  The duodenal bulb and 2nd portion of the duodenum were otherwise normal. Retroflexed views revealed a hiatal hernia.     The scope was then withdrawn from the patient and the procedure completed.  COMPLICATIONS: There  were no immediate complications.  ENDOSCOPIC IMPRESSION: Mild benign appearing Shatski ring, dilated up to 72mm TTS ballon without appreciable wrent. The ring was then disrupted with biopsy forceps 3cm hiatal hernia Benign gastric diverticulum Benign appearing small gastric polyp, removed Mild antral gastritis, suspect related to NSAID use, biopsies taken to rule out H pylori Normal duodenum  RECOMMENDATIONS: Resume diet Resume medications Await pathology results and course following dilation    eSigned:  Yetta Flock, MD 07/12/2015 1:54 PM    CC: the patient  PATIENT NAME:  Amanda, Terrell MR#: [id number]

## 2015-07-18 ENCOUNTER — Other Ambulatory Visit: Payer: Self-pay

## 2015-08-06 DIAGNOSIS — G894 Chronic pain syndrome: Secondary | ICD-10-CM | POA: Insufficient documentation

## 2015-08-24 ENCOUNTER — Encounter (HOSPITAL_COMMUNITY): Payer: BLUE CROSS/BLUE SHIELD | Attending: Hematology & Oncology | Admitting: Hematology & Oncology

## 2015-08-24 ENCOUNTER — Encounter (HOSPITAL_COMMUNITY): Payer: Self-pay | Admitting: Hematology & Oncology

## 2015-08-24 VITALS — BP 124/71 | HR 75 | Temp 98.1°F | Resp 18 | Wt 130.9 lb

## 2015-08-24 DIAGNOSIS — Z86000 Personal history of in-situ neoplasm of breast: Secondary | ICD-10-CM | POA: Diagnosis not present

## 2015-08-24 DIAGNOSIS — M810 Age-related osteoporosis without current pathological fracture: Secondary | ICD-10-CM | POA: Diagnosis not present

## 2015-08-24 DIAGNOSIS — D0510 Intraductal carcinoma in situ of unspecified breast: Secondary | ICD-10-CM

## 2015-08-24 DIAGNOSIS — C50412 Malignant neoplasm of upper-outer quadrant of left female breast: Secondary | ICD-10-CM

## 2015-08-24 DIAGNOSIS — Z780 Asymptomatic menopausal state: Secondary | ICD-10-CM

## 2015-08-24 NOTE — Patient Instructions (Addendum)
[place] at [facility] Discharge Instructions  RECOMMENDATIONS MADE BY THE CONSULTANT AND ANY TEST RESULTS WILL BE SENT TO YOUR REFERRING PHYSICIAN.    Exam and discussion by Dr Whitney Muse today Return to see the doctor in 6 months Please call the clinic if you have any questions or concern      Thank you for choosing [place] at [facility] to provide your oncology and hematology care.  To afford each patient quality time with our provider, please arrive at least 15 minutes before your scheduled appointment time.   Beginning January 23rd 2017 lab work for the [facility] will be done in the  Main lab at [facility] on 1st floor. If you have a lab appointment with the [facility] please come in thru the  Main Entrance and check in at the main information desk  You need to re-schedule your appointment should you arrive 10 or more minutes late.  We strive to give you quality time with our providers, and arriving late affects you and other patients whose appointments are after yours.  Also, if you no show three or more times for appointments you may be dismissed from the clinic at the providers discretion.     Again, thank you for choosing [facility].  Our hope is that these requests will decrease the amount of time that you wait before being seen by our physicians.       _____________________________________________________________  Should you have questions after your visit to [facility], please contact our office at [phone] between the hours of 8:30 a.m. and 4:30 p.m.  Voicemails left after 4:30 p.m. will not be returned until the following business day.  For prescription refill requests, have your pharmacy contact our office.

## 2015-08-24 NOTE — Progress Notes (Signed)
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 female is here for follow-up of  DCIS of the Left Breast. She has finished radiation therapy on October 03, 2014.  She had a nerve ablation at [facility] that unfortunately did not help her chronic back/leg pain.   She has osteoporosis and is currently on Fosamax weekly. She takes calcium and Vitamin D . She is doing ok with these medications. She had discontinued Tamoxifen prior to her last visit.  She has no complaints today.  She is here with her husband.  She feels well. She is up to date on all of her well care. She notes that her physician at [facility] is trying to obtain prolia for her.     Breast cancer of upper-outer quadrant of left female breast (Interior)   07/05/2014 Breast US Palpable left breast mass; U/S revealed irregular mass in UOQ of left breast with associated pleomorphic calcs. Mass and calcs span 1.5 cm.    07/07/2014 Initial Biopsy Left breast needle core biopsy, 2 o'clock position revealed intermediate grade DCIS with calcs. ER+ (100%), PR+ (59%).    07/07/2014 Initial Diagnosis Breast cancer of upper-outer quadrant of left female breast   07/12/2014 Breast MRI  UOQ of left breast with 2.0 x 1.4 x 1.3 cm biopsy-proven ductal carcinoma. No evidence of malignancy elsewhere in either breast. No adenopathy.    08/05/2014 Definitive Surgery Left lumpectomy (Dr. Dalbert Batman) revealed intermediate grade DCIS, spanning 2.2 cm. Negative margins. 1 intramammary LN benign.    09/05/2014 - 10/04/2014 Radiation Therapy Left breast: total radiation dose 42.72 Gy over 21 fractions.  Left breast boost: total radiation dose 10 Gy over 5 fractions.     Anti-estrogen oral therapy Tamoxifen started by Dr. Whitney Muse.  Planned duration of treatment: 5 years.    10/28/2014 Survivorship Survivorship Care Plan given and reviewed with patient in-person.      MEDICAL HISTORY:  Past Medical History  Diagnosis Date  . Cellulitis and abscess of unspecified site   . Rosacea   . Other specified disease of hair and hair follicles   . Axillary abscess     left  . Heart murmur   . Scoliosis     adhesive arachnoditis-right side  . Hiatal hernia   . GERD (gastroesophageal reflux disease)   . Internal hemorrhoids   . Diverticulosis   . Esophageal stricture   . Barrett's esophagus   . Neuropathy ([place])   . IBS (irritable bowel syndrome)   . Hyperlipidemia   . Mitral valve prolapse   . Reflex sympathetic dystrophy   . Breast cancer of upper-outer quadrant of left female breast ([place]) 07/11/2014  . Arthritis   . Hepatitis C 1990    interferon treatment  . 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 PONV (postoperative nausea and vomiting)   .  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/05/14-10/04/14    Left Breast DCIS    SURGICAL HISTORY: Past Surgical History  Procedure Laterality Date  . Back surgery  1969, 1989, 2006    due to scoliosis= total 5 back surgeries  . Cesarean section  1976, 1978, 1980, 1984  . Incise and drain abcess      left axillary abscess  . Breast fibroadenoma surgery      bilateral  . Hysteroscopy    . Colonoscopy  2009  . Upper gastrointestinal endoscopy    . Tonsillectomy    . Spinal  surgeries    . Spinal fusion    . Breast lumpectomy with radioactive seed localization Left 08/05/2014    Procedure: LEFT PARTICAL MASTECTOMY WITH RADIOACTIVE SEED LOCALIZATION;  Surgeon: Fanny Skates, MD;  Location: [place];  Service: General;  Laterality: Left;    SOCIAL HISTORY: Social History   Social History  . Marital Status: Married    Spouse Name: N/A  . Number of Children: N/A  . Years of Education: N/A   Occupational History  . Not on file.   Social History Main Topics  . Smoking status: Never Smoker   . Smokeless tobacco: Never Used  . Alcohol Use: 0.0 oz/week    0 Standard drinks or equivalent per week     Comment: 1 glass wine a month  . Drug Use: No  . Sexual Activity: Not on file   Other Topics Concern  . Not on file   Social History Narrative  Born in [place]. Married for 42 years. 3 daughters. 3 grand-daughters. 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 Rare ETOH.  FAMILY HISTORY: Family History  Problem Relation Age of Onset  . Heart disease Mother   . Brain cancer Father     Glioblastoma  . Colon cancer Neg Hx   . Melanoma Paternal Grandmother   . Breast cancer Maternal Aunt     in her 81s  . Lung cancer Maternal Aunt    indicated that her mother is deceased. She indicated that her father is deceased.   Mother deceased 16, from heart disease Father deceased 5, glioblastoma 2 Brothers, healthy.   ALLERGIES:  is allergic to acetaminophen.  MEDICATIONS:  Current Outpatient Prescriptions  Medication Sig Dispense Refill  . Ascorbic Acid (VITAMIN C) 1000 MG tablet Take by mouth.    . Calcium Carbonate-Vit D-Min (CALCIUM 1200 PO) Take 1,200 mg by mouth.    . cholecalciferol (VITAMIN D) 1000 UNITS tablet Take 1,000 Units by mouth daily. Reported on 07/12/2015    . diphenhydrAMINE (BENADRYL) 25 mg capsule Take 25 mg by mouth at bedtime as needed.    Marland Kitchen HYDROmorphone (DILAUDID)  4 MG tablet Take 1 tablet by mouth every 6 (six) hours as needed.     . Ibuprofen (MOTRIN PO) Take 400 mg by mouth every 4 (four) hours.     . levETIRAcetam (KEPPRA) 500 MG tablet Take 500 mg by mouth. Take once a day for one week then increase to 1 tablet twice a day thereafter.    . Multiple Vitamin (MULTIVITAMIN) tablet Take 1 tablet by mouth daily. Reported on 07/12/2015    . omeprazole (PRILOSEC) 20 MG capsule Take 1 capsule (20 mg total) by mouth 2 (two) times daily before a meal. 180 capsule 3  . alendronate-cholecalciferol (FOSAMAX PLUS D) 70-2800 MG-UNIT per tablet Take 1 tablet by mouth every 7 (seven) days. Take with a full glass of water on  an empty stomach. (Patient not taking: Reported on 08/24/2015) 4 tablet 6  . polyethylene glycol powder (GLYCOLAX/MIRALAX) powder Reported on 07/12/2015     No current facility-administered medications for this visit.    Review of Systems  HENT: Negative.   Eyes: Negative.   Respiratory: Negative.   Cardiovascular: Negative.   Gastrointestinal: Negative.   Genitourinary: Negative.   Musculoskeletal: Positive for myalgias and joint pain.  Neurological: Positive for tingling, sensory change, focal weakness and weakness.  Endo/Heme/Allergies: Negative.   Psychiatric/Behavioral: Negative.   14 point review of systems was performed and is negative except as detailed under history of present illness and above  PHYSICAL EXAMINATION: ECOG PERFORMANCE STATUS: 1 - Symptomatic but completely ambulatory  Filed Vitals:   08/24/15 1322  BP: 124/71  Pulse: 75  Temp: 98.1 F (36.7 C)  Resp: 18   Filed Weights   08/24/15 1322  Weight: 130 lb 14.4 oz (59.376 kg)    Physical Exam  Constitutional: She is oriented to person, place, and time and well-developed, well-nourished, and in no distress.  She uses a cane HENT:  Head: Normocephalic and atraumatic.  Nose: Nose normal.  Mouth/Throat: Oropharynx is clear and moist. No oropharyngeal exudate.   Eyes: Conjunctivae and EOM are normal. Pupils are equal, round, and reactive to light. Right eye exhibits no discharge. Left eye exhibits no discharge. No scleral icterus.  Neck: Normal range of motion. Neck supple. No tracheal deviation present. No thyromegaly present.  Cardiovascular: Normal rate, regular rhythm and normal heart sounds.  Exam reveals no gallop and no friction rub.   No murmur heard. Pulmonary/Chest: Effort normal and breath sounds normal. She has no wheezes. She has no rales.  Abdominal: Soft. Bowel sounds are normal. She exhibits no distension and no mass. There is no tenderness. There is no rebound and no guarding.  Musculoskeletal: Normal range of motion. She exhibits no edema.  RLE cool to the touch No UE swelling or erythema  Lymphadenopathy:    She has no cervical adenopathy.  Neurological: She is alert and oriented to person, place, and time. No cranial nerve deficit.  Gait abnormality noted Absent patellar reflex RLE  Skin: Skin is warm and dry. No rash noted.  Psychiatric: Mood, memory, affect and judgment normal.  Nursing note and vitals reviewed.   LABORATORY DATA:  I have reviewed the data as listed Lab Results  Component Value Date   WBC 5.7 11/21/2014   HGB 14.5 11/21/2014   HCT 44.4 11/21/2014   MCV 89.7 11/21/2014   PLT 224 11/21/2014     Chemistry      Component Value Date/Time   NA 142 11/21/2014 1330   NA 142 07/13/2014 1220   K 4.1 11/21/2014 1330   K 4.3 07/13/2014 1220   CL 102 11/21/2014 1330   CO2 32 11/21/2014 1330   CO2 30* 07/13/2014 1220   BUN 19 11/21/2014 1330   BUN 12.6 07/13/2014 1220   CREATININE 0.66 11/21/2014 1330   CREATININE 0.7 07/13/2014 1220      Component Value Date/Time   CALCIUM 9.6 11/21/2014 1330   CALCIUM 9.9 07/13/2014 1220   ALKPHOS 61 11/21/2014 1330   ALKPHOS 78 07/13/2014 1220   AST 24 11/21/2014 1330   AST 17 07/13/2014 1220   ALT 18 11/21/2014 1330   ALT 14 07/13/2014 1220   BILITOT 0.7  11/21/2014 1330   BILITOT 0.58 07/13/2014 1220      PATHOLOGY:  Breast, partial mastectomy, Left - DUCTAL CARCINOMA  IN SITU WITH CALCIFICATIONS, INTERMEDIATE GRADE, SPANNING 2.2 CM. - ONE BENIGN INTRAMAMMARY LYMPH NODE (0/1). - THE SURGICAL RESECTION MARGINS ARE NEGATIVE FOR DUCTAL CARCINOMA. Specimen, including laterality: Left breast. Procedure (include lymph node sampling sentinel-non-sentinel): Partial mastectomy. Grade of carcinoma: Intermediate grade. Necrosis: Present. Estimated tumor size: (gross measurement): 2.2 cm. Treatment effect: N/A. Distance to closest margin: Greater than 0.2 cm to all margins. Breast prognostic profile: [phone]. Estrogen receptor: 100%, strong staining intensity. Progesterone receptor: 59%, strong staining intensity. Lymph nodes: None examined. TNM: pTis, pNX. Comments: In additional to the ductal carcinoma in situ with calcifications, there are vascular calcifications as well as foci of adenosis with calcifications. (JBK:ds 08/08/14) Enid Cutter MD Pathologist, Electronic Signature (Case signed 08/08/2014)  RADIOLOGY:  CLINICAL DATA: History of treated left breast cancer, status post lumpectomy in January 2016.  EXAM: DIGITAL DIAGNOSTIC BILATERAL MAMMOGRAM WITH 3D TOMOSYNTHESIS AND CAD  COMPARISON: Previous exam(s).  ACR Breast Density Category c: The breast tissue is heterogeneously dense, which may obscure small masses.  FINDINGS: There are no suspicious masses, areas of nonsurgical architectural distortion or microcalcifications in either breast. Expected post lumpectomy changes are seen in the left breast upper outer quadrant, far posterior depth. There are scattered bilateral stable calcifications.  Mammographic images were processed with CAD.  IMPRESSION: No mammographic evidence of malignancy, status post left lumpectomy.  RECOMMENDATION: Diagnostic mammogram is suggested in 1 year. (Code:DM-B-01Y)  I  have discussed the findings and recommendations with the patient. Results were also provided in writing at the conclusion of the visit. If applicable, a reminder letter will be sent to the patient regarding the next appointment.  BI-RADS CATEGORY 2: Benign.   Electronically Signed  By: Fidela Salisbury M.D.  On: 07/11/2015 09:47    ASSESSMENT & PLAN:  DCIS, ER positive, PR positive, left breast Osteoporosis  60 year old female with ER positive DCIS of the left breast. She has undergone lumpectomy and has completed adjuvant radiation. She is doing well. She tried tamoxifen but opted to discontinue it because she did "not feel well on it." I have encouraged her to stay active, continue with screening mammograms and routine physical exams.  She has osteoporosis. We discussed prolia therapy but we could not get this approved by her insurance.  She is on calcium, vitamin D and Fosamax. Her physician at [facility] is trying to obtain prolia for her.   She needs a GI evaluation and would like to be referred locally to Dr. Laural Golden.   She will return in 6 months for repeat PE and breast exam.  All questions were answered. The patient knows to call the clinic with any problems, questions or concerns.   This note was electronically signed.  [place] Fam. Whitney Muse, MD

## 2015-11-17 IMAGING — MG MM LT RADIOACTIVE SEED LOC MAMMO GUIDE
8 series · 8 of 8 positions shown · non-contrast
Comparison: Previous exam(s)

CLINICAL DATA: The patient presents for seed localization prior to
lumpectomy. Recent diagnosis of left intermediate grade ductal
carcinoma in situ.

EXAM:
MAMMOGRAPHIC GUIDED RADIOACTIVE SEED LOCALIZATION OF THE LEFT BREAST

[L CC (1 of 5)]
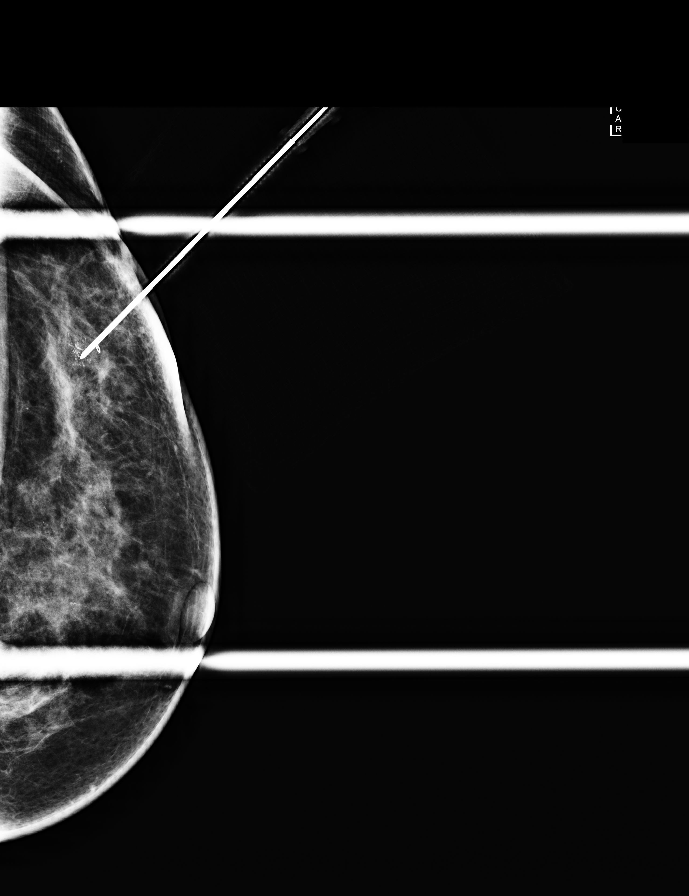

[L CC (2 of 5)]
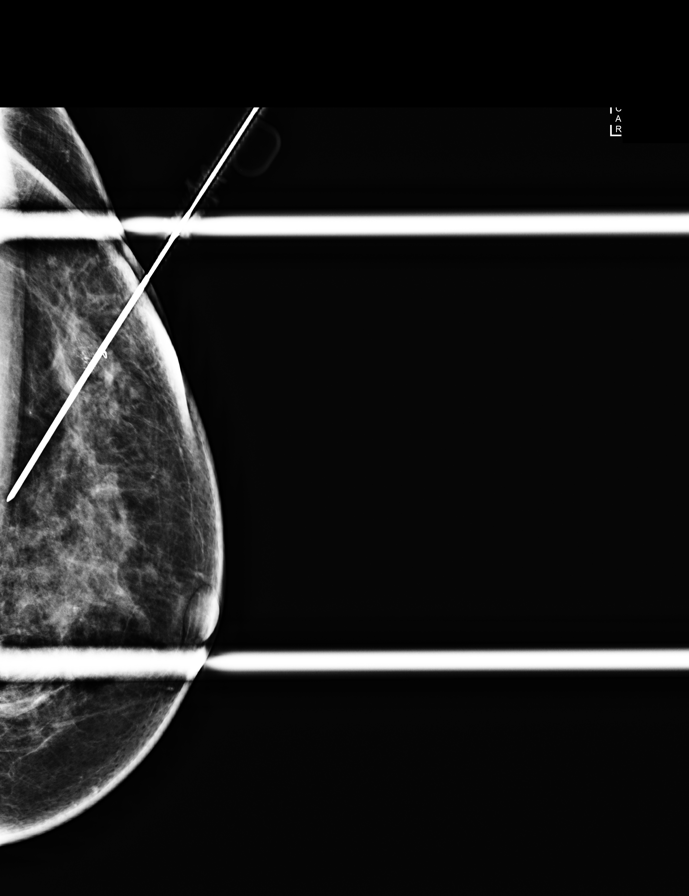

[L CC (3 of 5)]
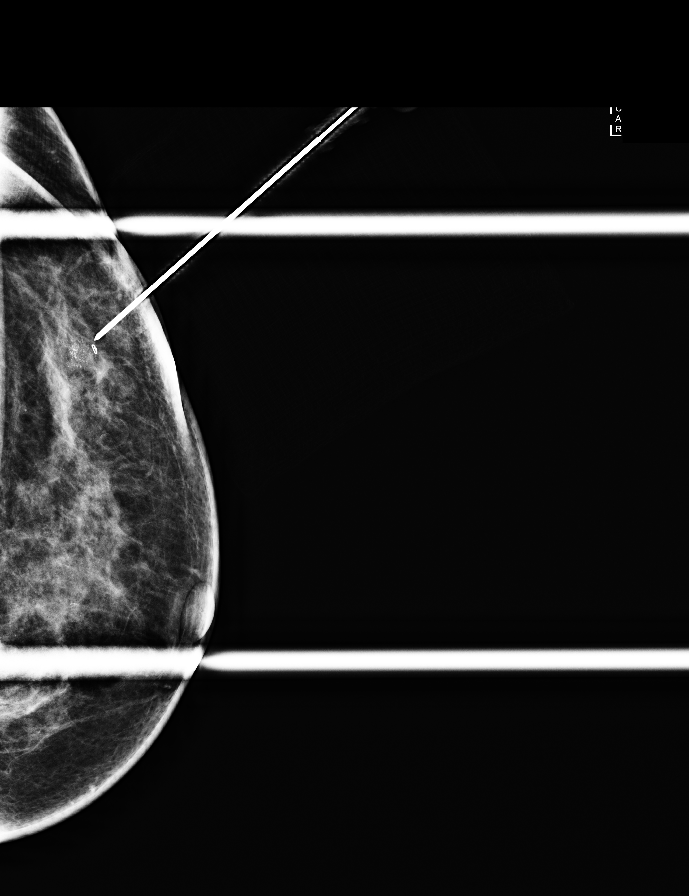

[L CC (4 of 5)]
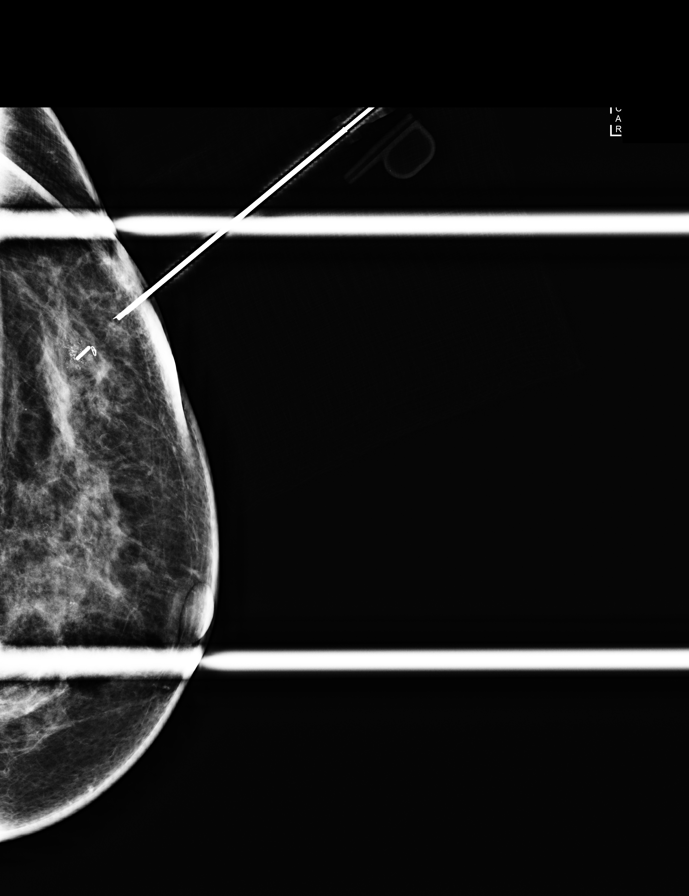

[L LM (1 of 3)]
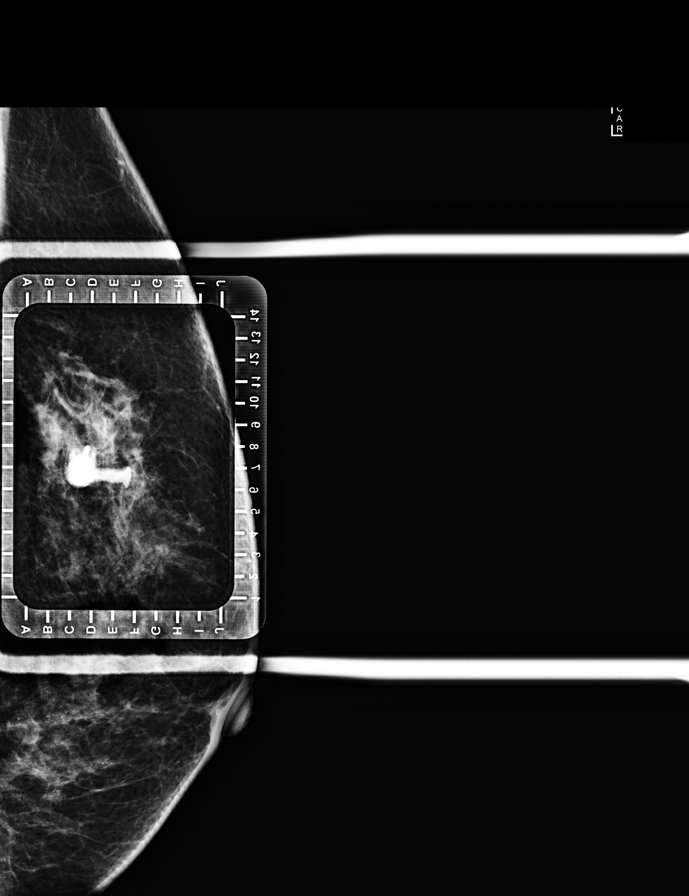

[L LM (2 of 3)]
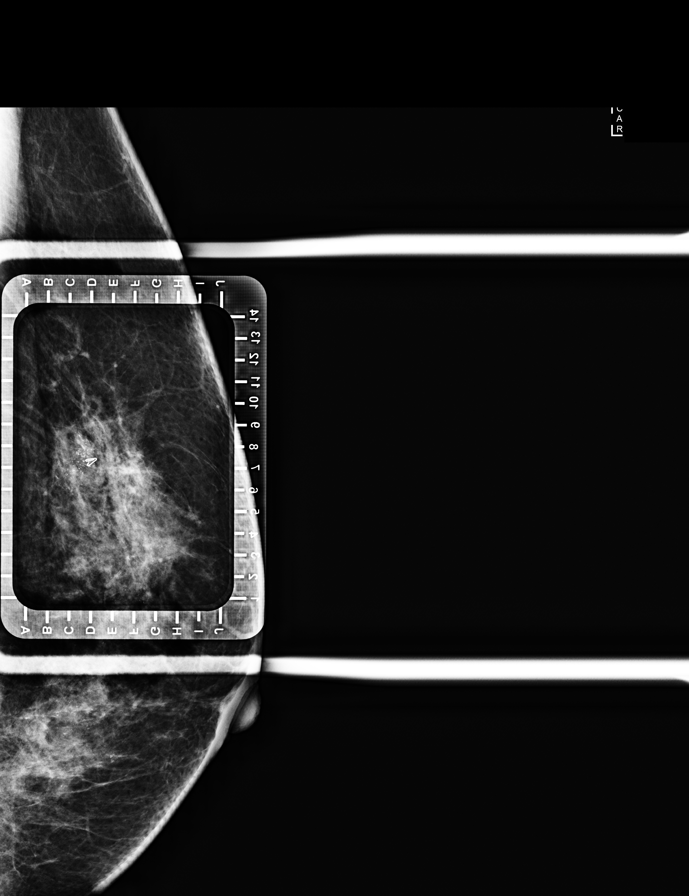

[L CC (5 of 5)]
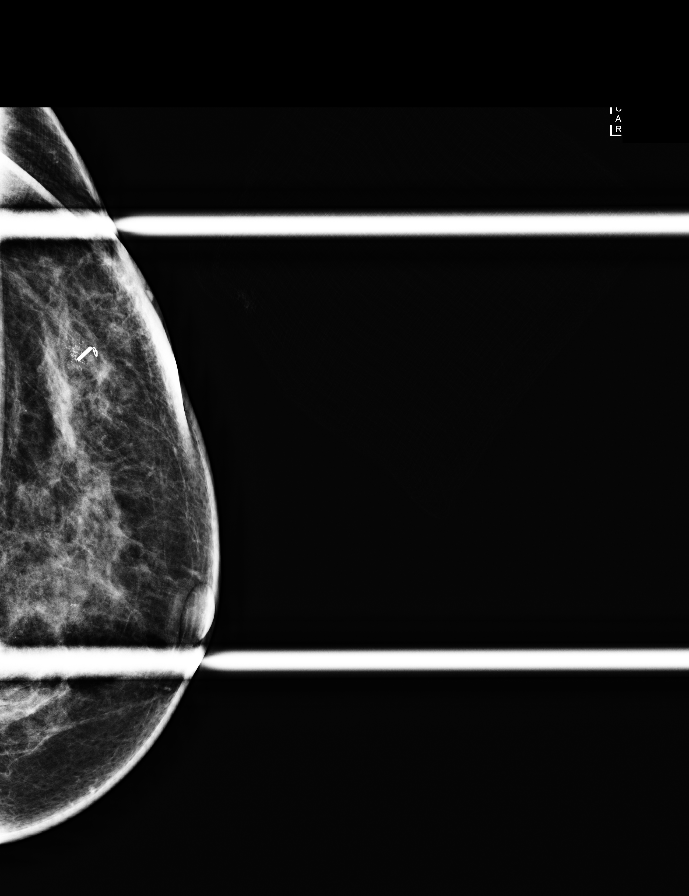

[L LM (3 of 3)]
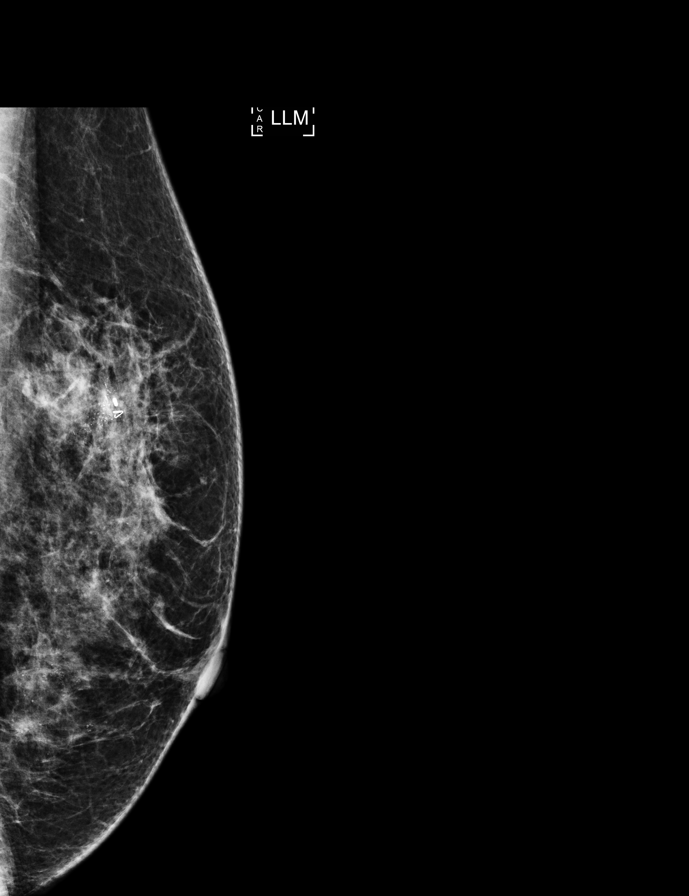

[8 of 8 positions shown; findings below may reference images not displayed]

FINDINGS: Patient presents for radioactive seed localization prior to
lumpectomy. I met with the patient and we discussed the procedure of
seed localization including benefits and alternatives. We discussed
the high likelihood of a successful procedure. We discussed the
risks of the procedure including infection, bleeding, tissue injury
and further surgery. We discussed the low dose of radioactivity
involved in the procedure. Informed, written consent was given.

The usual time-out protocol was performed immediately prior to the
procedure.

Using mammographic guidance, sterile technique, 2% lidocaine and an
[id number] radioactive seed, mass associated with calcifications in the
lateral portion of the left breast is localized using a lateral
approach. The follow-up mammogram images confirm the seed in the
expected location and are marked for Dr. Yiotis.

Follow-up survey of the patient confirms presence of radioactive
seed.

Order number of L-[id number] seed:  [id number].

Total activity:  0.245 mCi  Reference Date: 07/05/2014

The patient tolerated the procedure well and was released from the
[REDACTED]. She was given instructions regarding seed removal.
IMPRESSION: Radioactive seed localization left breast. No apparent
complications.

## 2016-02-21 ENCOUNTER — Other Ambulatory Visit (HOSPITAL_COMMUNITY): Payer: Self-pay | Admitting: Oncology

## 2016-02-21 ENCOUNTER — Encounter (HOSPITAL_COMMUNITY): Payer: BLUE CROSS/BLUE SHIELD | Attending: Oncology | Admitting: Oncology

## 2016-02-21 ENCOUNTER — Ambulatory Visit (HOSPITAL_COMMUNITY): Payer: BLUE CROSS/BLUE SHIELD | Admitting: Oncology

## 2016-02-21 ENCOUNTER — Encounter (HOSPITAL_COMMUNITY): Payer: Self-pay | Admitting: Oncology

## 2016-02-21 VITALS — BP 118/75 | HR 69 | Temp 98.2°F | Resp 16

## 2016-02-21 DIAGNOSIS — Z9889 Other specified postprocedural states: Secondary | ICD-10-CM

## 2016-02-21 DIAGNOSIS — C50412 Malignant neoplasm of upper-outer quadrant of left female breast: Secondary | ICD-10-CM

## 2016-02-21 DIAGNOSIS — Z86000 Personal history of in-situ neoplasm of breast: Secondary | ICD-10-CM

## 2016-02-21 NOTE — Patient Instructions (Signed)
[place] at [facility] Discharge Instructions  RECOMMENDATIONS MADE BY THE CONSULTANT AND ANY TEST RESULTS WILL BE SENT TO YOUR REFERRING PHYSICIAN.  You were seen by Gershon Mussel today. Return in 6 months for follow-up with labs. Mamamogram will be scheduled for December. Please call clinic with any related concerns.  Thank you for choosing [place] at [facility] to provide your oncology and hematology care.  To afford each patient quality time with our provider, please arrive at least 15 minutes before your scheduled appointment time.   Beginning January 23rd 2017 lab work for the [facility] will be done in the  Main lab at [facility] on 1st floor. If you have a lab appointment with the [facility] please come in thru the  Main Entrance and check in at the main information desk  You need to re-schedule your appointment should you arrive 10 or more minutes late.  We strive to give you quality time with our providers, and arriving late affects you and other patients whose appointments are after yours.  Also, if you no show three or more times for appointments you may be dismissed from the clinic at the providers discretion.     Again, thank you for choosing [facility].  Our hope is that these requests will decrease the amount of time that you wait before being seen by our physicians.       _____________________________________________________________  Should you have questions after your visit to [facility], please contact our office at [phone] between the hours of 8:30 a.m. and 4:30 p.m.  Voicemails left after 4:30 p.m. will not be returned until the following business day.  For prescription refill requests, have your pharmacy contact our office.         Resources For Cancer Patients and their Caregivers ? American Cancer Society: Can assist with transportation, wigs, general needs, runs Look Good Feel  Better.        707-282-7229 ? Cancer Care: Provides financial assistance, online support groups, medication/co-pay assistance.  1-800-813-HOPE (218) 052-5789) ? [place] Assists [place] cancer patients and their families through emotional , educational and financial support.  915 720 4163 ? [place] DSS Where to apply for food stamps, Medicaid and utility assistance. (912) 035-5845 ? [facility]: Transportation to medical appointments. 773 765 5023 ? Social Security Administration: May apply for disability if have a Stage IV cancer. 763-172-4340 312-279-2146 ? [employer], Disability and Transit Services: Assists with nutrition, care and transit needs. [place] Support Programs: @10RELATIVEDAYS @ > Cancer Support Group  2nd Tuesday of the month 1pm-2pm, Journey Room  > Creative Journey  3rd Tuesday of the month 1130am-1pm, Journey Room  > Look Good Feel Better  1st Wednesday of the month 10am-12 noon, Journey Room (Call [place] to register [phone])

## 2016-02-21 NOTE — Assessment & Plan Note (Addendum)
ER positive DCIS of the left breast, S/P lumpectomy on 08/05/2014 by Dr. Dalbert Batman followed by adjuvant XRT from 09/05/2014- 10/04/2014, followed by Tamoxifen which was discontinued 1 month into therapy due to intolerance.  She has opted for ongoing surveillance per NCCN guidelines.   No role for labs today.    Labs in 6 months: CBC diff, CMET.  She knows that if she has labs elsewhere over the next 6 months, she will get [place] a copy of those labs and if appropriate, cancel this lab appointment.  I personally reviewed and went over radiographic studies with the patient.  The results are noted within this dictation.  Mammogram from December 2016 is negative.  Order is placed for her next mammogram in December 2017.  She continues to follow at [facility] with regards to her chronic low back/right leg pain.  Return in 6 months for follow-up.

## 2016-02-21 NOTE — Progress Notes (Signed)
Amanda Terrell, [address] [address]  Breast cancer of upper-outer quadrant of left female breast [facility]) - Plan: MM DIAG BREAST TOMO BILATERAL, CBC with Differential, Comprehensive metabolic panel  CURRENT THERAPY: Surveillance per NCCN guidelines.  INTERVAL HISTORY: Amanda Terrell 60 y.o. 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 Dalbert Batman followed by adjuvant XRT from 09/05/2014- 10/04/2014, followed by Tamoxifen which was discontinued 1 month into therapy due to intolerance.  She has opted for ongoing surveillance per NCCN guidelines.     Breast cancer of upper-outer quadrant of left female breast ([place])   07/05/2014 Breast US    Palpable left breast mass; U/S revealed irregular mass in UOQ of left breast with associated pleomorphic calcs. Mass and calcs span 1.5 cm.      07/07/2014 Initial Biopsy    Left breast needle core biopsy, 2 o'clock position revealed intermediate grade DCIS with calcs. ER+ (100%), PR+ (59%).      07/07/2014 Initial Diagnosis    Breast cancer of upper-outer quadrant of left female breast     07/12/2014 Breast MRI    UOQ of left breast with 2.0 x 1.4 x 1.3 cm biopsy-proven ductal carcinoma. No evidence of malignancy elsewhere in either breast. No adenopathy.      08/05/2014 Definitive Surgery    Left lumpectomy (Dr. Dalbert Batman) revealed intermediate grade DCIS, spanning 2.2 cm. Negative margins. 1 intramammary LN benign.      09/05/2014 - 10/04/2014 Radiation Therapy    Left breast: total radiation dose 42.72 Gy over 21 fractions.  Left breast boost: total radiation dose 10 Gy over 5 fractions.      09/22/2014 - 10/21/2014 Anti-estrogen oral therapy    Tamoxifen started by Dr. Whitney Muse.  Planned duration of treatment: 5 years.      10/16/2014 Adverse Reaction    Did not feel well. Worsening pain/joint pain     10/28/2014 Survivorship    Survivorship Care Plan given and reviewed  with patient in-person.      She denies any complaints.  Review of Systems  Constitutional: Negative.  Negative for chills, fever and weight loss.  HENT: Negative.   Eyes: Negative.  Negative for blurred vision and double vision.  Respiratory: Negative.  Negative for cough, hemoptysis and shortness of breath.   Cardiovascular: Negative.  Negative for chest pain.  Gastrointestinal: Negative.  Negative for abdominal pain, nausea and vomiting.  Genitourinary: Negative.   Musculoskeletal: Negative.   Skin: Negative.   Neurological: Negative.  Negative for loss of consciousness, weakness and headaches.  Endo/Heme/Allergies: Negative.   Psychiatric/Behavioral: Negative.     Past Medical History:  Diagnosis Date  . 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 Arthritis   . Axillary abscess    left  . Barrett's esophagus   . Breast cancer of upper-outer quadrant of left female breast ([facility]) 07/11/2014  . Cellulitis and abscess of unspecified site   . Diverticulosis   . Esophageal stricture   . GERD (gastroesophageal reflux disease)   . Heart murmur   . Hepatitis C 1990   interferon treatment  . Hiatal hernia   . Hyperlipidemia   . IBS (irritable bowel syndrome)   . Internal hemorrhoids   . Mitral valve prolapse   . Neuropathy ([place])   . Other specified disease of hair and hair follicles   . PONV (postoperative nausea and vomiting)   .  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 Reflex sympathetic dystrophy   . Rosacea   . Scoliosis    adhesive arachnoditis-right side    Past Surgical History:  Procedure Laterality Date  . [place], 2006   due to scoliosis= total 5 back surgeries  . BREAST FIBROADENOMA SURGERY     bilateral  . BREAST LUMPECTOMY WITH RADIOACTIVE SEED LOCALIZATION Left 08/05/2014   Procedure: LEFT PARTICAL MASTECTOMY WITH RADIOACTIVE SEED LOCALIZATION;  Surgeon: Fanny Skates, MD;  Location: [facility];  Service: General;   Laterality: Left;  . [place], 1980, 1984  . COLONOSCOPY  2009  . HYSTEROSCOPY    . INCISE AND DRAIN ABCESS     left axillary abscess  . SPINAL FUSION    . spinal surgeries    . TONSILLECTOMY    . UPPER GASTROINTESTINAL ENDOSCOPY      Family History  Problem Relation Age of Onset  . Heart disease Mother   . Brain cancer Father     Glioblastoma  . Melanoma Paternal Grandmother   . Breast cancer Maternal Aunt     in her 29s  . Lung cancer Maternal Aunt   . Colon cancer Neg Hx     Social History   Social History  . Marital status: Married    Spouse name: N/A  . Number of children: N/A  . Years of education: N/A   Social History Main Topics  . Smoking status: Never Smoker  . Smokeless tobacco: Never Used  . Alcohol use 0.0 oz/week     Comment: 1 glass wine a month  . Drug use: No  . Sexual activity: Not Asked   Other Topics Concern  . None   Social History Narrative  . None     PHYSICAL EXAMINATION  ECOG PERFORMANCE STATUS: 1 - Symptomatic but completely ambulatory  Vitals:   02/21/16 1000  BP: 118/75  Pulse: 69  Resp: 16  Temp: 98.2 F (36.8 C)    GENERAL:alert, no distress, well nourished, well developed, comfortable, cooperative, smiling and accompanied by her husband SKIN: skin color, texture, turgor are normal, no rashes or significant lesions HEAD: Normocephalic, No masses, lesions, tenderness or abnormalities EYES: normal, EOMI, Conjunctiva are pink and non-injected EARS: External ears normal OROPHARYNX:lips, buccal mucosa, and tongue normal and mucous membranes are moist  NECK: supple, no adenopathy, thyroid normal size, non-tender, without nodularity, trachea midline LYMPH:  no palpable lymphadenopathy, no hepatosplenomegaly BREAST:right breast normal without mass, skin or nipple changes or axillary nodes, left post-lumpectomy site well healed and free of suspicious changes with noted radiation tattoos LUNGS: clear to  auscultation and percussion HEART: regular rate & rhythm, no murmurs, no gallops, S1 normal and S2 normal ABDOMEN:abdomen soft, non-tender, normal bowel sounds and no masses or organomegaly BACK: Back symmetric, no curvature. EXTREMITIES:less then 2 second capillary refill, no edema, no skin discoloration, no clubbing, no cyanosis  NEURO: alert & oriented x 3 with fluent speech, no focal motor/sensory deficits, gait normal   LABORATORY DATA: CBC    Component Value Date/Time   WBC 5.7 11/21/2014 1330   RBC 4.95 11/21/2014 1330   HGB 14.5 11/21/2014 1330   HGB 14.7 07/13/2014 1220   HCT 44.4 11/21/2014 1330   HCT 45.7 07/13/2014 1220   PLT 224 11/21/2014 1330   PLT 244 07/13/2014 1220   MCV 89.7 11/21/2014 1330   MCV 87.9 07/13/2014 1220   MCH 29.3 11/21/2014 1330  MCHC 32.7 11/21/2014 1330   RDW 13.2 11/21/2014 1330   RDW 13.5 07/13/2014 1220   LYMPHSABS 1.0 11/21/2014 1330   LYMPHSABS 1.3 07/13/2014 1220   MONOABS 0.3 11/21/2014 1330   MONOABS 0.5 07/13/2014 1220   EOSABS 0.1 11/21/2014 1330   EOSABS 0.1 07/13/2014 1220   BASOSABS 0.0 11/21/2014 1330   BASOSABS 0.1 07/13/2014 1220      Chemistry      Component Value Date/Time   NA 142 11/21/2014 1330   NA 142 07/13/2014 1220   K 4.1 11/21/2014 1330   K 4.3 07/13/2014 1220   CL 102 11/21/2014 1330   CO2 32 11/21/2014 1330   CO2 30 (H) 07/13/2014 1220   BUN 19 11/21/2014 1330   BUN 12.6 07/13/2014 1220   CREATININE 0.66 11/21/2014 1330   CREATININE 0.7 07/13/2014 1220      Component Value Date/Time   CALCIUM 9.6 11/21/2014 1330   CALCIUM 9.9 07/13/2014 1220   ALKPHOS 61 11/21/2014 1330   ALKPHOS 78 07/13/2014 1220   AST 24 11/21/2014 1330   AST 17 07/13/2014 1220   ALT 18 11/21/2014 1330   ALT 14 07/13/2014 1220   BILITOT 0.7 11/21/2014 1330   BILITOT 0.58 07/13/2014 1220        PENDING LABS:   RADIOGRAPHIC STUDIES:  No results found.   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 Dalbert Batman followed by adjuvant XRT from 09/05/2014- 10/04/2014, followed by Tamoxifen which was discontinued 1 month into therapy due to intolerance.  She has opted for ongoing surveillance per NCCN guidelines.   No role for labs today.    Labs in 6 months: CBC diff, CMET.  She knows that if she has labs elsewhere over the next 6 months, she will get [place] a copy of those labs and if appropriate, cancel this lab appointment.  I personally reviewed and went over radiographic studies with the patient.  The results are noted within this dictation.  Mammogram from December 2016 is negative.  Order is placed for her next mammogram in December 2017.  She continues to follow at [facility] with regards to her chronic low back/right leg pain.  Return in 6 months for follow-up.   ORDERS PLACED FOR THIS ENCOUNTER: Orders Placed This Encounter  Procedures  . MM DIAG BREAST TOMO BILATERAL  . CBC with Differential  . Comprehensive metabolic panel    MEDICATIONS PRESCRIBED THIS ENCOUNTER: No orders of the defined types were placed in this encounter.   THERAPY PLAN:  NCCN guidelines recommends the following surveillance for DCIS of breast post-operatively (2.2017):  1. Interval history and physical exam every 6-12 months for 5 years, then annually.  2. Mammogram every 12 months (and 6-12 months postradiation therapy if breast conserved Category 2B).  3. If treated with Tamoxifen, monitor per NCCN guidelines for Breast Cancer Risk Reduction.  Risk reduction therapy for ipsilateral breast folling breast-conserving surgery (2.2017):  1. Consider endocrine therapy for 5 years for:   A. Patients treated with breast conserving therapy (lumpectomy) and radiation therapy (category 1), especially for those with ER positive DCIS.   B. Benefit of endocrine therapy for ER negative DCIS is uncertain.   C.  Patients treated with excision alone.  2. Endocrine therapy:   A. Tamoxifen for premenopausal patients.   B. Tamoxifen or aromatase inhibitor for postmenopausal patients with  some advantage for aromatase inhibitor therapy in patients less than 16 years old or with concerns for thromboembolism  3. Risk reduction therapy for contralateral breast:   A. Counseling regarding risk reduction. See NCCN guidelines for breast cancer risk reduction.   All questions were answered. The patient knows to call the clinic with any problems, questions or concerns. We can certainly see the patient much sooner if necessary.  Patient and plan discussed with Dr. Ancil Linsey and she is in agreement with the aforementioned.   This note is electronically signed by: Doy Mince 02/21/2016 6:07 PM

## 2016-02-26 ENCOUNTER — Telehealth: Payer: Self-pay | Admitting: Gastroenterology

## 2016-02-26 NOTE — Telephone Encounter (Signed)
She had labs done over a year ago, but looks like her primary care has ordered CBC and CMP in the system which is pending. She can get this done with him. I was going to recommend LFTs yearly, but she can get done labs per primary care. Thanks

## 2016-02-26 NOTE — Telephone Encounter (Signed)
Patient is asking when she will be due for labs. Please, advise.

## 2016-02-27 ENCOUNTER — Other Ambulatory Visit: Payer: Self-pay | Admitting: *Deleted

## 2016-02-27 DIAGNOSIS — Z8619 Personal history of other infectious and parasitic diseases: Secondary | ICD-10-CM

## 2016-02-27 NOTE — Telephone Encounter (Signed)
Patient given recommendations. She wants to have the lab done at our office. Lab in [facility].

## 2016-04-18 ENCOUNTER — Emergency Department (HOSPITAL_COMMUNITY): Payer: BLUE CROSS/BLUE SHIELD

## 2016-04-18 ENCOUNTER — Emergency Department (HOSPITAL_COMMUNITY)
Admission: EM | Admit: 2016-04-18 | Discharge: 2016-04-19 | Disposition: A | Discharge status: Home / Self Care | Payer: BLUE CROSS/BLUE SHIELD | Attending: Emergency Medicine | Admitting: Emergency Medicine

## 2016-04-18 ENCOUNTER — Other Ambulatory Visit: Payer: Self-pay

## 2016-04-18 ENCOUNTER — Encounter (HOSPITAL_COMMUNITY): Payer: Self-pay | Admitting: *Deleted

## 2016-04-18 DIAGNOSIS — Z79899 Other long term (current) drug therapy: Secondary | ICD-10-CM | POA: Diagnosis not present

## 2016-04-18 DIAGNOSIS — R079 Chest pain, unspecified: Secondary | ICD-10-CM

## 2016-04-18 DIAGNOSIS — R0789 Other chest pain: Secondary | ICD-10-CM | POA: Diagnosis present

## 2016-04-18 DIAGNOSIS — Z791 Long term (current) use of non-steroidal anti-inflammatories (NSAID): Secondary | ICD-10-CM | POA: Insufficient documentation

## 2016-04-18 DIAGNOSIS — Z853 Personal history of malignant neoplasm of breast: Secondary | ICD-10-CM | POA: Insufficient documentation

## 2016-04-18 LAB — CBC
HEMATOCRIT: 45.2 % (ref 36.0–46.0)
HEMOGLOBIN: 14.8 g/dL (ref 12.0–15.0)
MCH: 29.2 pg (ref 26.0–34.0)
MCHC: 32.7 g/dL (ref 30.0–36.0)
MCV: 89.2 fL (ref 78.0–100.0)
Platelets: 284 10*3/uL (ref 150–400)
RBC: 5.07 MIL/uL (ref 3.87–5.11)
RDW: 13.5 % (ref 11.5–15.5)
WBC: 8.3 10*3/uL (ref 4.0–10.5)

## 2016-04-18 LAB — BASIC METABOLIC PANEL
ANION GAP: 8 (ref 5–15)
BUN: 19 mg/dL (ref 6–20)
CO2: 28 mmol/L (ref 22–32)
Calcium: 9.1 mg/dL (ref 8.9–10.3)
Chloride: 99 mmol/L — ABNORMAL LOW (ref 101–111)
Creatinine, Ser: 0.67 mg/dL (ref 0.44–1.00)
GFR calc Af Amer: >60 mL/min (ref >60)
GLUCOSE: 140 mg/dL — AB (ref 65–99)
POTASSIUM: 4.1 mmol/L (ref 3.5–5.1)
SODIUM: 135 mmol/L (ref 135–145)

## 2016-04-18 LAB — I-STAT TROPONIN, ED: TROPONIN I, POC: 0 ng/mL (ref 0.00–0.08)

## 2016-04-18 LAB — TROPONIN I: Troponin I: <0.03 ng/mL (ref <0.03)

## 2016-04-18 MED ORDER — ASPIRIN 81 MG PO CHEW
324.0000 mg | CHEWABLE_TABLET | Freq: Once | ORAL | Status: AC
  Administered 2016-04-18: 324 mg via ORAL
  Filled 2016-04-18: qty 4

## 2016-04-18 NOTE — ED Provider Notes (Signed)
Emergency Department Provider Note  By signing my name below, I, Dolores Hoose, attest that this documentation has been prepared under the direction and in the presence of Margette Fast, MD . Electronically Signed: Dolores Hoose, Scribe. 04/18/2016. 8:40 PM.   I have reviewed the triage vital signs and the nursing notes.   HISTORY  History provided by the patient. No language interpreter was used.  Chief Complaint Chest Pain  HPI:  Amanda Terrell is a 60 y.o. female who presents to the Emergency Department complaining of sudden-onset intermittent chest pain onset tonight. Pt describes her symptoms as an intense, sharp, mid-chest pressure as if someone "had a knife to [her] chest". Her pain lasts about 10 minutes at a time, does not travel to other parts of her chest, and is exacerbated by deep inhalation but not exertion. She has had 3 episodes of chest pain in the past 7-10 days. Pt endorses associated nausea, diaphoresis, and constipation. When asked about the timing of her nausea and diaphoresis, pt states that these symptoms do not occur in tandem with her chest pain. She denies any abdominal pain. Pt has lower extremity pain, but she reports this is baseline for her.  She has no Hx of HTN or Diabetes but does have a Fhx of heart problems.   Past Medical History:  Diagnosis Date  . 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 Arthritis   . Axillary abscess    left  . Barrett's esophagus   . Breast cancer of upper-outer quadrant of left female breast ([place]) 07/11/2014  . Cellulitis and abscess of unspecified site   . Diverticulosis   . Esophageal stricture   . GERD (gastroesophageal reflux disease)   . Heart murmur   . Hepatitis C 1990   interferon treatment  . Hiatal hernia   . Hyperlipidemia   . IBS (irritable bowel syndrome)   . Internal hemorrhoids   . Mitral valve prolapse   . Neuropathy ([place])   . Other specified disease of hair and hair follicles   . PONV  (postoperative nausea and vomiting)   . 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 Reflex sympathetic dystrophy   . Rosacea   . Scoliosis    adhesive arachnoditis-right side    Patient Active Problem List   Diagnosis Date Noted  . Lower extremity pain, left 04/12/2015  . Osteoporosis 11/21/2014  . Breast neoplasm, [place] (DCIS) 07/13/2014  . Breast cancer of upper-outer quadrant of left female breast ([place]) 07/11/2014  . Abscess of axilla, left 06/27/2011  . HYPERLIPIDEMIA 01/26/2008  . REFLEX SYMPATHETIC DYSTROPHY 01/26/2008  . NEUROPATHY 01/26/2008  . HEMORRHOIDS, INTERNAL 01/26/2008  . ESOPHAGEAL STRICTURE 01/26/2008  . GERD 01/26/2008  . BARRETTS ESOPHAGUS 01/26/2008  . DIVERTICULOSIS, COLON 01/26/2008  . IRRITABLE BOWEL SYNDROME 01/26/2008  . SCOLIOSIS 01/26/2008  . HEPATITIS C, HX OF 01/26/2008  . MITRAL VALVE PROLAPSE, HX OF 01/26/2008    Past Surgical History:  Procedure Laterality Date  . [place], 2006   due to scoliosis= total 5 back surgeries  . BREAST FIBROADENOMA SURGERY     bilateral  . BREAST LUMPECTOMY WITH RADIOACTIVE SEED LOCALIZATION Left 08/05/2014   Procedure: LEFT PARTICAL MASTECTOMY WITH RADIOACTIVE SEED LOCALIZATION;  Surgeon: Fanny Skates, MD;  Location: [place];  Service: General;  Laterality: Left;  . [place], 1980, 1984  . COLONOSCOPY  2009  . HYSTEROSCOPY    . INCISE AND  DRAIN ABCESS     left axillary abscess  . SPINAL FUSION    . spinal surgeries    . TONSILLECTOMY    . UPPER GASTROINTESTINAL ENDOSCOPY      Current Outpatient Rx  . Order #: [id number] Class: Historical Med  . Order #: [id number] Class: Historical Med  . Order #: [id number] Class: Historical Med  . Order #: [id number] Class: Historical Med  . Order #: [id number] Class: Historical Med  . Order #: [id number] Class: Historical Med  . Order #: [id number] Class: Normal  . Order #: [id number] Class: Historical Med     Allergies Acetaminophen  Family History  Problem Relation Age of Onset  . Heart disease Mother   . Brain cancer Father     Glioblastoma  . Melanoma Paternal Grandmother   . Breast cancer Maternal Aunt     in her 26s  . Lung cancer Maternal Aunt   . Colon cancer Neg Hx     Social History Social History  Substance Use Topics  . Smoking status: Never Smoker  . Smokeless tobacco: Never Used  . Alcohol use 0.0 oz/week     Comment: 1 glass wine a month    Review of Systems  Constitutional: No fever/chills. Positive for Diaphoresis Eyes: No visual changes. ENT: No sore throat. Cardiovascular: Positive for chest pain. Respiratory: Denies shortness of breath. Gastrointestinal: No abdominal pain. No vomiting.  No diarrhea. Positive for nausea and constipation Genitourinary: Negative for dysuria. Musculoskeletal: Negative for back pain. Skin: Negative for rash. Neurological: Negative for headaches, focal weakness or numbness.  10-point ROS otherwise negative.  ____________________________________________   PHYSICAL EXAM:  VITAL SIGNS: ED Triage Vitals  Enc Vitals Group     BP 04/18/16 2034 117/94     Pulse Rate 04/18/16 2034 81     Resp 04/18/16 2034 16     Temp 04/18/16 2034 98.2 F (36.8 C)     Temp Source 04/18/16 2034 Oral     SpO2 04/18/16 2034 100 %     Weight 04/18/16 2035 133 lb (60.3 kg)     Height 04/18/16 2035 5' 5.5" (1.664 m)    Constitutional: Alert and oriented. Well appearing and in no acute distress. Eyes: Conjunctivae are normal.  Head: Atraumatic. Nose: No congestion/rhinnorhea. Mouth/Throat: Mucous membranes are moist.  Oropharynx non-erythematous. Neck: No stridor.  Cardiovascular: Normal rate, regular rhythm. Good peripheral circulation. Grossly normal heart sounds.   Respiratory: Normal respiratory effort.  No retractions. Lungs CTAB. Gastrointestinal: Soft and nontender. No distention.  Musculoskeletal: No lower extremity  tenderness nor edema. No gross deformities of extremities. Neurologic:  Normal speech and language. No gross focal neurologic deficits are appreciated.  Skin:  Skin is warm, dry and intact. No rash noted. Psychiatric: Mood and affect are normal. Speech and behavior are normal.  ____________________________________________   LABS (all labs ordered are listed, but only abnormal results are displayed)  DIAGNOSTIC STUDIES:  Oxygen Saturation is 100% on RA, normal by my interpretation.    COORDINATION OF CARE:  1:33 PM Discussed treatment plan with pt at bedside and pt agreed to plan.   Labs Reviewed  BASIC METABOLIC PANEL - Abnormal; Notable for the following:       Result Value   Chloride 99 (*)    Glucose, Bld 140 (*)    All other components within normal limits  CBC  TROPONIN I  I-STAT TROPOININ, ED   ____________________________________________  EKG   EKG Interpretation  Date/Time:  Thursday April 18 2016 20:36:03 EDT Ventricular Rate:  14  PR Interval:    QRS Duration: 88 QT Interval:  365 QTC Calculation: 437 R Axis:   62 Text Interpretation:  Sinus rhythm Borderline short PR interval RSR' in V1 or V2, probably normal variant Similar to prior. No STEMI.  Confirmed by Adalis Gatti MD, Lorrane Mccay [phone]) on 04/18/2016 8:40:23 PM       ____________________________________________  RADIOLOGY  Dg Chest 2 View  Result Date: 04/18/2016 CLINICAL DATA:  60 y/o F; central chest pain and shortness of breath with onset tonight. EXAM: CHEST  2 VIEW COMPARISON:  08/02/2014. FINDINGS: Stable cardiomediastinal silhouette given projection and technique. Moderate scoliosis is unchanged. Partially visualized fusion hardware at the thoracolumbar junction. Clear lungs. No pneumothorax. No pleural effusion. Surgical clips project over the left axilla. No acute osseous abnormality is identified. IMPRESSION: No active cardiopulmonary disease. Electronically Signed   By: Kristine Garbe  M.D.   On: 04/18/2016 21:43    ____________________________________________   PROCEDURES  Procedure(s) performed:   Procedures  None ____________________________________________   INITIAL IMPRESSION / ASSESSMENT AND PLAN / ED COURSE  Pertinent labs & imaging results that were available during my care of the patient were reviewed by me and considered in my medical decision making (see chart for details).  Patient presents to the ED for evaluation of intermittent chest pain. HEART score 2. Low risk patient. Will give ASA and follow serial biomarkers.   10:45 PM Updated patient. No additional CP. Plan for repeat troponin and reassess. HEART score 2 (low risk).   Repeat troponin negative. No chest pain while in the ED. Updated patient and discussed PCP follow up. She has seen a Cardiologist before and will call that office herself to arrange outpatient stress test.   At this time, I do not feel there is any life-threatening condition present. I have reviewed and discussed all results (EKG, imaging, lab, urine as appropriate), exam findings with patient. I have reviewed nursing notes and appropriate previous records.  I feel the patient is safe to be discharged home without further emergent workup. Discussed usual and customary return precautions. Patient and family (if present) verbalize understanding and are comfortable with this plan.  Patient will follow-up with their primary care provider. If they do not have a primary care provider, information for follow-up has been provided to them. All questions have been answered.    ____________________________________________  FINAL CLINICAL IMPRESSION(S) / ED DIAGNOSES  Final diagnoses:  Nonspecific chest pain     MEDICATIONS GIVEN DURING THIS VISIT:  Medications  aspirin chewable tablet 324 mg (324 mg Oral Given 04/18/16 2138)     NEW OUTPATIENT MEDICATIONS STARTED DURING THIS VISIT:  None  Documentation performed with the  assistance of the scribe. Reviewed all documentation and made changes as needed.   Note:  This document was prepared using Dragon voice recognition software and may include unintentional dictation errors.  Nanda [place], MD Emergency Medicine     Margette Fast, MD 04/19/16 1336

## 2016-04-18 NOTE — ED Triage Notes (Signed)
Pt c/o mid center non radiating chest pressure that started tonight while in a meeting with sob, states that she also had two separate episodes over the past week of chest pain as well,

## 2016-04-19 NOTE — Discharge Instructions (Signed)

## 2016-05-17 ENCOUNTER — Other Ambulatory Visit (HOSPITAL_COMMUNITY): Payer: Self-pay | Admitting: Emergency Medicine

## 2016-05-17 NOTE — Progress Notes (Signed)
Pt called and wanted to know if she could have her labs drawn before the end of the year for her appt in Jan.  She was worried about her insurance changing.  I made her a new lab appt for July 19, 2016 at 11:00 am.  Pt verbalized understanding.

## 2016-06-10 ENCOUNTER — Other Ambulatory Visit (INDEPENDENT_AMBULATORY_CARE_PROVIDER_SITE_OTHER): Payer: BLUE CROSS/BLUE SHIELD

## 2016-06-10 ENCOUNTER — Other Ambulatory Visit (HOSPITAL_COMMUNITY): Payer: Self-pay | Admitting: Oncology

## 2016-06-10 DIAGNOSIS — Z8619 Personal history of other infectious and parasitic diseases: Secondary | ICD-10-CM | POA: Diagnosis not present

## 2016-06-10 DIAGNOSIS — Z9889 Other specified postprocedural states: Secondary | ICD-10-CM

## 2016-06-10 LAB — HEPATIC FUNCTION PANEL
ALK PHOS: 70 U/L (ref 39–117)
ALT: 13 U/L (ref 0–35)
AST: 17 U/L (ref 0–37)
Albumin: 4.5 g/dL (ref 3.5–5.2)
BILIRUBIN DIRECT: 0.1 mg/dL (ref 0.0–0.3)
TOTAL PROTEIN: 7.1 g/dL (ref 6.0–8.3)
Total Bilirubin: 0.7 mg/dL (ref 0.2–1.2)

## 2016-06-11 ENCOUNTER — Encounter: Payer: Self-pay | Admitting: Gastroenterology

## 2016-06-12 NOTE — Progress Notes (Signed)
Letter mailed

## 2016-07-16 ENCOUNTER — Ambulatory Visit (HOSPITAL_COMMUNITY)
Admission: RE | Admit: 2016-07-16 | Discharge: 2016-07-16 | Disposition: A | Discharge status: Home / Self Care | Payer: BLUE CROSS/BLUE SHIELD | Source: Ambulatory Visit | Attending: Oncology | Admitting: Oncology

## 2016-07-16 ENCOUNTER — Ambulatory Visit (HOSPITAL_COMMUNITY): Payer: BLUE CROSS/BLUE SHIELD

## 2016-07-16 DIAGNOSIS — C50412 Malignant neoplasm of upper-outer quadrant of left female breast: Secondary | ICD-10-CM

## 2016-07-16 DIAGNOSIS — Z853 Personal history of malignant neoplasm of breast: Secondary | ICD-10-CM | POA: Diagnosis present

## 2016-07-19 ENCOUNTER — Encounter (HOSPITAL_COMMUNITY): Payer: BLUE CROSS/BLUE SHIELD | Attending: Hematology & Oncology

## 2016-07-19 DIAGNOSIS — C50412 Malignant neoplasm of upper-outer quadrant of left female breast: Secondary | ICD-10-CM | POA: Insufficient documentation

## 2016-07-19 LAB — CBC WITH DIFFERENTIAL/PLATELET
BASOS ABS: 0 10*3/uL (ref 0.0–0.1)
BASOS PCT: 1 %
Eosinophils Absolute: 0.1 10*3/uL (ref 0.0–0.7)
Eosinophils Relative: 2 %
HEMATOCRIT: 43.9 % (ref 36.0–46.0)
HEMOGLOBIN: 14 g/dL (ref 12.0–15.0)
LYMPHS PCT: 18 %
Lymphs Abs: 1.1 10*3/uL (ref 0.7–4.0)
MCH: 29.1 pg (ref 26.0–34.0)
MCHC: 31.9 g/dL (ref 30.0–36.0)
MCV: 91.3 fL (ref 78.0–100.0)
Monocytes Absolute: 0.4 10*3/uL (ref 0.1–1.0)
Monocytes Relative: 7 %
NEUTROS ABS: 4.7 10*3/uL (ref 1.7–7.7)
NEUTROS PCT: 74 %
Platelets: 289 10*3/uL (ref 150–400)
RBC: 4.81 MIL/uL (ref 3.87–5.11)
RDW: 13.5 % (ref 11.5–15.5)
WBC: 6.4 10*3/uL (ref 4.0–10.5)

## 2016-07-19 LAB — COMPREHENSIVE METABOLIC PANEL
ALBUMIN: 4.2 g/dL (ref 3.5–5.0)
ALK PHOS: 58 U/L (ref 38–126)
ALT: 18 U/L (ref 14–54)
AST: 22 U/L (ref 15–41)
Anion gap: 6 (ref 5–15)
BILIRUBIN TOTAL: 0.7 mg/dL (ref 0.3–1.2)
BUN: 14 mg/dL (ref 6–20)
CO2: 28 mmol/L (ref 22–32)
CREATININE: 0.57 mg/dL (ref 0.44–1.00)
Calcium: 9.5 mg/dL (ref 8.9–10.3)
Chloride: 105 mmol/L (ref 101–111)
GFR calc Af Amer: >60 mL/min (ref >60)
GFR calc non Af Amer: >60 mL/min (ref >60)
GLUCOSE: 94 mg/dL (ref 65–99)
POTASSIUM: 4 mmol/L (ref 3.5–5.1)
Sodium: 139 mmol/L (ref 135–145)
TOTAL PROTEIN: 7.1 g/dL (ref 6.5–8.1)

## 2016-07-29 HISTORY — PX: COLONOSCOPY WITH PROPOFOL: SHX5780

## 2016-08-23 ENCOUNTER — Encounter (HOSPITAL_COMMUNITY): Payer: BLUE CROSS/BLUE SHIELD | Attending: Hematology & Oncology | Admitting: Hematology & Oncology

## 2016-08-23 ENCOUNTER — Encounter (HOSPITAL_COMMUNITY): Payer: Self-pay | Admitting: Hematology & Oncology

## 2016-08-23 ENCOUNTER — Other Ambulatory Visit (HOSPITAL_COMMUNITY): Payer: BLUE CROSS/BLUE SHIELD

## 2016-08-23 VITALS — BP 106/76 | HR 75 | Temp 98.6°F | Resp 16 | Wt 139.0 lb

## 2016-08-23 DIAGNOSIS — Z170 Estrogen receptor positive status [ER+]: Secondary | ICD-10-CM | POA: Diagnosis not present

## 2016-08-23 DIAGNOSIS — Z780 Asymptomatic menopausal state: Secondary | ICD-10-CM

## 2016-08-23 DIAGNOSIS — M810 Age-related osteoporosis without current pathological fracture: Secondary | ICD-10-CM

## 2016-08-23 DIAGNOSIS — D0512 Intraductal carcinoma in situ of left breast: Secondary | ICD-10-CM | POA: Diagnosis not present

## 2016-08-23 DIAGNOSIS — C50412 Malignant neoplasm of upper-outer quadrant of left female breast: Secondary | ICD-10-CM | POA: Insufficient documentation

## 2016-08-23 MED ORDER — ALENDRONATE SODIUM 70 MG PO TABS: 70.0000 mg | ORAL_TABLET | ORAL | 3 refills | Status: DC

## 2016-08-23 NOTE — Progress Notes (Signed)
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 female is here for follow-up of  DCIS of the Left Breast. She has finished radiation therapy on October 03, 2014.  She had a nerve ablation at [facility] that unfortunately did not help her chronic back/leg pain.   She returns to the [facility] today accompanied by her husband. She uses a cane to ambulate.  Since our last visit, the patient went to the ED for nonspecific chest pain in September 2017.  She was seen by gynecology in the fall since our last visit and a pap smear was performed. She also had a breast exam performed at this time.   She is doing well overall. She is taking calcium and Vitamin D supplements. She performs self breast exams regularly.   She had issues with insurance covering prolia. She is requesting a prescription for Fosamax.  She went this past week to [facility] and is considering having a spine stimulator inserted. Last mammogram was in December.     Breast cancer of upper-outer quadrant of left female breast ([place])   07/05/2014 Breast US    Palpable left breast mass; U/S revealed irregular mass in UOQ of left breast with associated pleomorphic  calcs. Mass and calcs span 1.5 cm.       07/07/2014 Initial Biopsy    Left breast needle core biopsy, 2 o'clock position revealed intermediate grade DCIS with calcs. ER+ (100%), PR+ (59%).       07/07/2014 Initial Diagnosis    Breast cancer of upper-outer quadrant of left female breast      07/12/2014 Breast MRI    UOQ of left breast with 2.0 x 1.4 x 1.3 cm biopsy-proven ductal carcinoma. No evidence of malignancy elsewhere in either breast. No adenopathy.       08/05/2014 Definitive Surgery    Left lumpectomy (Dr. Dalbert Batman) revealed intermediate grade DCIS, spanning 2.2 cm. Negative margins. 1 intramammary LN benign.       09/05/2014 - 10/04/2014 Radiation Therapy    Left breast: total radiation dose 42.72 Gy over 21 fractions.  Left breast boost: total radiation dose 10 Gy over 5 fractions.       09/22/2014 - 10/21/2014 Anti-estrogen oral therapy    Tamoxifen started by Dr. Whitney Muse.  Planned duration of treatment: 5 years.       10/16/2014 Adverse Reaction    Did not feel well. Worsening pain/joint pain      10/28/2014 Survivorship    Survivorship Care Plan given and reviewed with patient in-person.         MEDICAL HISTORY:  Past Medical History:  Diagnosis Date  . 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 Arthritis   . Axillary abscess    left  . Barrett's esophagus   . Breast cancer of upper-outer quadrant of left female breast ([place])  07/11/2014  . Cellulitis and abscess of unspecified site   . Diverticulosis   . Esophageal stricture   . GERD (gastroesophageal reflux disease)   . Heart murmur   . Hepatitis C 1990   interferon treatment  . Hiatal hernia   . Hyperlipidemia   . IBS (irritable bowel syndrome)   . Internal hemorrhoids   . Mitral valve prolapse   . Neuropathy ([place])   . Other specified disease of hair and hair follicles   . PONV (postoperative nausea and vomiting)   . 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 Reflex sympathetic dystrophy   .  Rosacea   . Scoliosis    adhesive arachnoditis-right side    SURGICAL HISTORY: Past Surgical History:  Procedure Laterality Date  . [place], 2006   due to scoliosis= total 5 back surgeries  . BREAST FIBROADENOMA SURGERY     bilateral  . BREAST LUMPECTOMY WITH RADIOACTIVE SEED LOCALIZATION Left 08/05/2014   Procedure: LEFT PARTICAL MASTECTOMY WITH RADIOACTIVE SEED LOCALIZATION;  Surgeon: Fanny Skates, MD;  Location: [facility];  Service: General;  Laterality: Left;  . [place], 1980, 1984  . COLONOSCOPY  2009  . HYSTEROSCOPY    . INCISE AND DRAIN ABCESS     left axillary abscess  . SPINAL FUSION    . spinal surgeries    . TONSILLECTOMY    . UPPER GASTROINTESTINAL ENDOSCOPY      SOCIAL HISTORY: Social History   Social History  . Marital status: Married    Spouse name: N/A  . Number of children: N/A  . Years of education: N/A   Occupational History  . Not on file.   Social History Main Topics  . Smoking status: Never Smoker  . Smokeless tobacco: Never Used  . Alcohol use 0.0 oz/week     Comment: 1 glass wine a month  . Drug use: No  . Sexual activity: Not on file   Other Topics Concern  . Not on file   Social History Narrative  . No narrative on file  Born in [place]. Married for 42 years. 3 daughters. 3 grand-daughters. 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 Rare ETOH.  FAMILY HISTORY: Family History  Problem Relation Age of Onset  . Heart disease Mother   . Brain cancer Father     Glioblastoma  . Melanoma Paternal Grandmother   . Breast cancer Maternal Aunt     in her 39s  . Lung cancer Maternal Aunt   . Colon cancer Neg Hx    indicated that her mother is deceased. She indicated that her father is deceased. She indicated that the status of her paternal grandmother is unknown. She indicated that the status of her neg hx is unknown.    Mother  deceased 33, from heart disease Father deceased 79, glioblastoma 2 Brothers, healthy.   ALLERGIES:  is allergic to acetaminophen.  MEDICATIONS:  Current Outpatient Prescriptions  Medication Sig Dispense Refill  . Calcium Carbonate-Vit D-Min (CALCIUM 1200 PO) Take 1,200 mg by mouth daily.     . cholecalciferol (VITAMIN D) 1000 UNITS tablet Take 1,000 Units by mouth daily. Reported on 07/12/2015    . diphenhydrAMINE (BENADRYL) 25 mg capsule Take 25 mg by mouth at bedtime as needed for itching or sleep.     Marland Kitchen HYDROmorphone (DILAUDID) 4 MG tablet Take 2-4 mg by mouth every 6 (six) hours as needed for  moderate pain or severe pain.     Marland Kitchen ibuprofen (ADVIL,MOTRIN) 200 MG tablet Take 400 mg by mouth every 4 (four) hours as needed for mild pain or moderate pain.    . Multiple Vitamin (MULTIVITAMIN) tablet Take 1 tablet by mouth daily. Reported on 07/12/2015    . omeprazole (PRILOSEC) 20 MG capsule Take 1 capsule (20 mg total) by mouth 2 (two) times daily before a meal. (Patient taking differently: Take 20 mg by mouth daily as needed (for GERD). ) 180 capsule 3  . polyethylene glycol powder (GLYCOLAX/MIRALAX) powder 17g mixed in water/juice daily as needed for constipation     No current facility-administered medications for this visit.    Review of Systems  Constitutional: Negative.   HENT: Negative.   Eyes: Negative.   Respiratory: Negative.   Cardiovascular: Negative.   Gastrointestinal: Negative.   Genitourinary: Negative.   Musculoskeletal: Negative.   Skin: Negative.   Neurological: Negative.   Endo/Heme/Allergies: Negative.   Psychiatric/Behavioral: Negative.   All other systems reviewed and are negative.  14 point review of systems was performed and is negative except as detailed under history of present illness and above  PHYSICAL EXAMINATION: ECOG PERFORMANCE STATUS: 1 - Symptomatic but completely ambulatory  Vitals:   08/23/16 1202  BP: 106/76  Pulse: 75  Resp: 16  Temp:  98.6 F (37 C)   Filed Weights   08/23/16 1202  Weight: 139 lb (63 kg)    Physical Exam  Constitutional: She is oriented to person, place, and time and well-developed, well-nourished, and in no distress.  HENT:  Head: Normocephalic and atraumatic.  Mouth/Throat: Oropharynx is clear and moist. No oropharyngeal exudate.  Eyes: Conjunctivae and EOM are normal. Pupils are equal, round, and reactive to light. No scleral icterus.  Neck: Normal range of motion. Neck supple.  Cardiovascular: Normal rate, regular rhythm and normal heart sounds.   Pulmonary/Chest: Effort normal and breath sounds normal.  Abdominal: Soft. Bowel sounds are normal. She exhibits no distension and no mass. There is no tenderness. There is no rebound and no guarding.  Musculoskeletal: Normal range of motion.  Lymphadenopathy:    She has no cervical adenopathy.  Neurological: She is alert and oriented to person, place, and time. Gait normal.  Skin: Skin is warm and dry.  Psychiatric: Mood, memory, affect and judgment normal.  Nursing note and vitals reviewed.   LABORATORY DATA:  I have reviewed the data as listed Lab Results  Component Value Date   WBC 6.4 07/19/2016   HGB 14.0 07/19/2016   HCT 43.9 07/19/2016   MCV 91.3 07/19/2016   PLT 289 07/19/2016     Chemistry      Component Value Date/Time   NA 139 07/19/2016 1052   NA 142 07/13/2014 1220   K 4.0 07/19/2016 1052   K 4.3 07/13/2014 1220   CL 105 07/19/2016 1052   CO2 28 07/19/2016 1052   CO2 30 (H) 07/13/2014 1220   BUN 14 07/19/2016 1052   BUN 12.6 07/13/2014 1220   CREATININE 0.57 07/19/2016 1052   CREATININE 0.7 07/13/2014 1220      Component Value Date/Time   CALCIUM 9.5 07/19/2016 1052   CALCIUM 9.9 07/13/2014 1220   ALKPHOS 58 07/19/2016 1052   ALKPHOS 78 07/13/2014 1220   AST 22 07/19/2016 1052   AST 17 07/13/2014 1220   ALT 18 07/19/2016 1052   ALT 14 07/13/2014 1220   BILITOT 0.7 07/19/2016 1052   BILITOT 0.58 07/13/2014  1220  PATHOLOGY:  Breast, partial mastectomy, Left - DUCTAL CARCINOMA IN SITU WITH CALCIFICATIONS, INTERMEDIATE GRADE, SPANNING 2.2 CM. - ONE BENIGN INTRAMAMMARY LYMPH NODE (0/1). - THE SURGICAL RESECTION MARGINS ARE NEGATIVE FOR DUCTAL CARCINOMA. Specimen, including laterality: Left breast. Procedure (include lymph node sampling sentinel-non-sentinel): Partial mastectomy. Grade of carcinoma: Intermediate grade. Necrosis: Present. Estimated tumor size: (gross measurement): 2.2 cm. Treatment effect: N/A. Distance to closest margin: Greater than 0.2 cm to all margins. Breast prognostic profile: [phone]. Estrogen receptor: 100%, strong staining intensity. Progesterone receptor: 59%, strong staining intensity. Lymph nodes: None examined. TNM: pTis, pNX. Comments: In additional to the ductal carcinoma in situ with calcifications, there are vascular calcifications as well as foci of adenosis with calcifications. (JBK:ds 08/08/14) Enid Cutter MD Pathologist, Electronic Signature (Case signed 08/08/2014)  RADIOLOGY: RADIOLOGY: I have reviewed the images below and agree with the reported results Study Result   CLINICAL DATA:  History of left breast cancer, status post lumpectomy in January 2016. Annual examination.  EXAM: 2D DIGITAL DIAGNOSTIC BILATERAL MAMMOGRAM WITH CAD AND ADJUNCT TOMO  COMPARISON:  Previous exam(s).  ACR Breast Density Category c: The breast tissue is heterogeneously dense, which may obscure small masses.  FINDINGS: There are lumpectomy changes in the deep outer left breast. No mass, nonsurgical distortion, or suspicious microcalcification is identified in either breast to suggest malignancy.  Mammographic images were processed with CAD.  IMPRESSION: No evidence of malignancy in either breast. Lumpectomy changes on the left.  RECOMMENDATION: Diagnostic mammogram is suggested in 1 year. (Code:[id number]-B-01Y)  I have discussed the  findings and recommendations with the patient. Results were also provided in writing at the conclusion of the visit. If applicable, a reminder letter will be sent to the patient regarding the next appointment.  BI-RADS CATEGORY  2: Benign.   Electronically Signed   By: Curlene Dolphin M.D.   On: 07/16/2016 11:01     ASSESSMENT & PLAN:  DCIS, ER positive, PR positive, left breast Osteoporosis  61 year old female with ER positive DCIS of the left breast. She has undergone lumpectomy and has completed adjuvant radiation. She is doing well. She tried tamoxifen but opted to discontinue it because she did "not feel well on it." I have encouraged her to stay active, continue with screening mammograms and routine physical exams.  She has osteoporosis. We discussed prolia therapy but we could not get this approved by her insurance.  She is on calcium, vitamin D. Her physician at [facility] is trying to obtain prolia for her. In the interim she was given a prescription for Fosamax today.   Last mammogram performed on 07/16/2016. Last bone density performed on 08/29/2014.  She was seen by gynecology in the fall since our last visit and a pap smear was performed. She also had a breast exam performed at this gynecology visit.  She is following with orthopaedics at [facility].   She will return for follow up in 6 months.   Meds ordered this encounter  Medications  . alendronate (FOSAMAX) 70 MG tablet    Sig: Take 1 tablet (70 mg total) by mouth once a week. Take with a full glass of water on an empty stomach.    Dispense:  4 tablet    Refill:  3    All questions were answered. The patient knows to call the clinic with any problems, questions or concerns.   This document serves as a record of services personally performed by Ancil Linsey, MD. It was created on her behalf by Arlyce Harman,  a trained medical scribe. The creation of this record is based on the scribe's personal observations and the  provider's statements to them. This document has been checked and approved by the attending provider.  I have reviewed the above documentation for accuracy and completeness and I agree with the above.  This note was electronically signed.  Kelby Fam. Whitney Muse, MD

## 2016-08-23 NOTE — Patient Instructions (Signed)
[place] at [facility] Discharge Instructions  RECOMMENDATIONS MADE BY THE CONSULTANT AND ANY TEST RESULTS WILL BE SENT TO YOUR REFERRING PHYSICIAN.  You were seen today by Dr. Youlanda Roys were given a  Prescription for Fosamax, take one tablet each week for bone strengthening Follow up in 6 months  Thank you for choosing [place] at [facility] to provide your oncology and hematology care.  To afford each patient quality time with our provider, please arrive at least 15 minutes before your scheduled appointment time.    If you have a lab appointment with the [facility] please come in thru the  Main Entrance and check in at the main information desk  You need to re-schedule your appointment should you arrive 10 or more minutes late.  We strive to give you quality time with our providers, and arriving late affects you and other patients whose appointments are after yours.  Also, if you no show three or more times for appointments you may be dismissed from the clinic at the providers discretion.     Again, thank you for choosing [facility].  Our hope is that these requests will decrease the amount of time that you wait before being seen by our physicians.       _____________________________________________________________  Should you have questions after your visit to [facility], please contact our office at [phone] between the hours of 8:30 a.m. and 4:30 p.m.  Voicemails left after 4:30 p.m. will not be returned until the following business day.  For prescription refill requests, have your pharmacy contact our office.       Resources For Cancer Patients and their Caregivers ? American Cancer Society: Can assist with transportation, wigs, general needs, runs Look Good Feel Better.        (930)281-2485 ? Cancer Care: Provides financial assistance, online support groups, medication/co-pay assistance.   1-800-813-HOPE 409-394-8927) ? [facility] Assists [place] cancer patients and their families through emotional , educational and financial support.  312 195 3123 ? [place] DSS Where to apply for food stamps, Medicaid and utility assistance. (531)232-1208 ? [facility]: Transportation to medical appointments. 641-455-3250 ? Social Security Administration: May apply for disability if have a Stage IV cancer. 5147825450 (918)717-9856 ? [employer], Disability and Transit Services: Assists with nutrition, care and transit needs. [facility] Support Programs: @10RELATIVEDAYS @ > Cancer Support Group  2nd Tuesday of the month 1pm-2pm, Journey Room  > Creative Journey  3rd Tuesday of the month 1130am-1pm, Journey Room  > Look Good Feel Better  1st Wednesday of the month 10am-12 noon, Journey Room (Call [facility] to register [phone])

## 2016-08-27 ENCOUNTER — Telehealth: Payer: Self-pay | Admitting: Gastroenterology

## 2016-08-27 NOTE — Telephone Encounter (Signed)
Yes that's fine. Thanks  

## 2016-08-27 NOTE — Telephone Encounter (Signed)
Is Miralax ok to refill. Pt has not been seen since 06/2015.

## 2016-08-29 ENCOUNTER — Other Ambulatory Visit: Payer: Self-pay

## 2016-08-29 MED ORDER — POLYETHYLENE GLYCOL 3350 17 GM/SCOOP PO POWD: ORAL | 1 refills | Status: DC

## 2016-08-29 NOTE — Telephone Encounter (Signed)
Miralax refilled and sent to [facility]

## 2017-02-19 ENCOUNTER — Encounter (HOSPITAL_COMMUNITY): Payer: Self-pay | Admitting: Oncology

## 2017-02-19 ENCOUNTER — Encounter (HOSPITAL_COMMUNITY): Payer: BLUE CROSS/BLUE SHIELD | Attending: Oncology | Admitting: Oncology

## 2017-02-19 ENCOUNTER — Ambulatory Visit (HOSPITAL_COMMUNITY): Payer: BLUE CROSS/BLUE SHIELD | Admitting: Oncology

## 2017-02-19 VITALS — BP 129/74 | HR 74 | Resp 16 | Ht 65.5 in | Wt 140.0 lb

## 2017-02-19 DIAGNOSIS — Z170 Estrogen receptor positive status [ER+]: Secondary | ICD-10-CM

## 2017-02-19 DIAGNOSIS — M810 Age-related osteoporosis without current pathological fracture: Secondary | ICD-10-CM

## 2017-02-19 DIAGNOSIS — Z86000 Personal history of in-situ neoplasm of breast: Secondary | ICD-10-CM

## 2017-02-19 DIAGNOSIS — C50412 Malignant neoplasm of upper-outer quadrant of left female breast: Secondary | ICD-10-CM

## 2017-02-19 MED ORDER — ALENDRONATE SODIUM 70 MG PO TABS: 70.0000 mg | ORAL_TABLET | ORAL | 3 refills | Status: DC

## 2017-02-19 NOTE — Progress Notes (Signed)
Amanda Noble, MD [address] [place] [address]  Malignant neoplasm of upper-outer quadrant of left breast in female, estrogen receptor positive ([place]) - Plan: MM DIAG BREAST TOMO BILATERAL, alendronate (FOSAMAX) 70 MG tablet  CURRENT THERAPY: Surveillance per NCCN guidelines.  INTERVAL HISTORY: Amanda Terrell 61 y.o. 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  S/P left lumpectomy by Dr. Dalbert Batman on 08/05/2014 followed by XRT (09/05/2014- 10/04/2014) followed by anti-endocrine therapy consisting of Tamoxifen.  She discontinued Tamoxifen after several months of taking due to side effects (09/22/2014- 10/21/2014). AND Osteoporosis, on Fosamax, Ca++, and Vit D.   AND Chronic pain, managed by neurology/pain specialist    Breast cancer of upper-outer quadrant of left female breast ([place])   07/05/2014 Breast US    Palpable left breast mass; U/S revealed irregular mass in UOQ of left breast with associated pleomorphic calcs. Mass and calcs span 1.5 cm.       07/07/2014 Initial Biopsy    Left breast needle core biopsy, 2 o'clock position revealed intermediate grade DCIS with calcs. ER+ (100%), PR+ (59%).       07/07/2014 Initial Diagnosis    Breast cancer of upper-outer quadrant of left female breast      07/12/2014 Breast MRI    UOQ of left breast with 2.0 x 1.4 x 1.3 cm biopsy-proven ductal carcinoma. No evidence of malignancy elsewhere in either breast. No adenopathy.       08/05/2014 Definitive Surgery    Left lumpectomy (Dr. Dalbert Batman) revealed intermediate grade DCIS, spanning 2.2 cm. Negative margins. 1 intramammary LN benign.       09/05/2014 - 10/04/2014 Radiation Therapy    Left breast: total radiation dose 42.72 Gy over 21 fractions.  Left breast boost: total radiation dose 10 Gy over 5 fractions.       09/22/2014 - 10/21/2014 Anti-estrogen oral therapy    Tamoxifen started by Dr. Whitney Muse.  Planned duration of treatment: 5 years.         10/16/2014 Adverse Reaction    Did not feel well. Worsening pain/joint pain      10/28/2014 Survivorship    Survivorship Care Plan given and reviewed with patient in-person.         HPI Elements   Location: Left breast  Quality: DCIS  Severity: Stage 0  Duration: Dx on 07/07/2014  Context: ER/PR POSITIVE  Timing: S/P lumpectomy by Dr. Dalbert Batman on 08/05/2014.  Modifying Factors: S/P adjuvant XRT.  Intolerant to adjuvant Tamoxifen  Associated Signs & Symptoms:    She reports an appetite of 100% and energy level of 75%.  Her weight is stable.  She notes right hip and leg pain which she rates a 10/10.    She is going to [place] in November 2018.  Review of Systems  Constitutional: Negative.  Negative for chills, fever and weight loss.  HENT: Negative.   Eyes: Negative.   Respiratory: Negative.  Negative for cough.   Cardiovascular: Negative.  Negative for chest pain.  Gastrointestinal: Negative.  Negative for blood in stool, constipation, diarrhea, melena, nausea and vomiting.  Genitourinary: Negative.   Musculoskeletal: Negative.   Skin: Negative.   Neurological: Positive for sensory change. Negative for weakness.  Endo/Heme/Allergies: Negative.   Psychiatric/Behavioral: Negative.     Past Medical History:  Diagnosis Date  . 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 Arthritis   . Axillary abscess    left  .  Barrett's esophagus   . Breast cancer of upper-outer quadrant of left female breast ([place]) 07/11/2014  . Cellulitis and abscess of unspecified site   . Diverticulosis   . Esophageal stricture   . GERD (gastroesophageal reflux disease)   . Heart murmur   . Hepatitis C 1990   interferon treatment  . Hiatal hernia   . Hyperlipidemia   . IBS (irritable bowel syndrome)   . Internal hemorrhoids   . Mitral valve prolapse   . Neuropathy   . Other specified disease of hair and hair follicles   . PONV (postoperative nausea and vomiting)   . Radiation  09/05/14-10/04/14   Left Breast DCIS  . Reflex sympathetic dystrophy   . Rosacea   . Scoliosis    adhesive arachnoditis-right side    Past Surgical History:  Procedure Laterality Date  . [place], 2006   due to scoliosis= total 5 back surgeries  . BREAST FIBROADENOMA SURGERY     bilateral  . BREAST LUMPECTOMY WITH RADIOACTIVE SEED LOCALIZATION Left 08/05/2014   Procedure: LEFT PARTICAL MASTECTOMY WITH RADIOACTIVE SEED LOCALIZATION;  Surgeon: Fanny Skates, MD;  Location: [facility];  Service: General;  Laterality: Left;  . [place], 1980, 1984  . COLONOSCOPY  2009  . HYSTEROSCOPY    . INCISE AND DRAIN ABCESS     left axillary abscess  . SPINAL FUSION    . spinal surgeries    . TONSILLECTOMY    . UPPER GASTROINTESTINAL ENDOSCOPY      Family History  Problem Relation Age of Onset  . Heart disease Mother   . Brain cancer Father        Glioblastoma  . Melanoma Paternal Grandmother   . Breast cancer Maternal Aunt        in her 6s  . Lung cancer Maternal Aunt   . Colon cancer Neg Hx     Social History   Social History  . Marital status: Married    Spouse name: N/A  . Number of children: N/A  . Years of education: N/A   Social History Main Topics  . Smoking status: Never Smoker  . Smokeless tobacco: Never Used  . Alcohol use 0.0 oz/week     Comment: 1 glass wine a month  . Drug use: No  . Sexual activity: Not Asked   Other Topics Concern  . None   Social History Narrative  . None     PHYSICAL EXAMINATION  ECOG PERFORMANCE STATUS: 1 - Symptomatic but completely ambulatory  Vitals:   02/19/17 1034  BP: 129/74  Pulse: 74  Resp: 16    GENERAL:alert, no distress, well nourished, well developed, comfortable, cooperative, smiling and accompanied by husband SKIN: skin color, texture, turgor are normal, no rashes or significant lesions HEAD: Normocephalic, No masses, lesions, tenderness or abnormalities EYES:  normal, EOMI, Conjunctiva are pink and non-injected EARS: External ears normal OROPHARYNX:lips, buccal mucosa, and tongue normal and mucous membranes are moist  NECK: supple, no adenopathy, trachea midline LYMPH:  no palpable lymphadenopathy, no hepatosplenomegaly BREAST:right breast normal without mass, skin or nipple changes or axillary nodes, left post-lumpectomy site well healed and free of suspicious changes LUNGS: clear to auscultation and percussion HEART: regular rate & rhythm, no murmurs, no gallops, S1 normal and S2 normal ABDOMEN:abdomen soft, non-tender and normal bowel sounds BACK: Back symmetric, no curvature., midline surgical scar noted EXTREMITIES:less then 2 second capillary refill, no skin discoloration, no cyanosis, positive findings:  edema right pedal, trace.  NEURO: alert & oriented x 3 with fluent speech, no focal motor/sensory deficits, walks with cane    LABORATORY DATA: CBC    Component Value Date/Time   WBC 6.4 07/19/2016 1052   RBC 4.81 07/19/2016 1052   HGB 14.0 07/19/2016 1052   HGB 14.7 07/13/2014 1220   HCT 43.9 07/19/2016 1052   HCT 45.7 07/13/2014 1220   PLT 289 07/19/2016 1052   PLT 244 07/13/2014 1220   MCV 91.3 07/19/2016 1052   MCV 87.9 07/13/2014 1220   MCH 29.1 07/19/2016 1052   MCHC 31.9 07/19/2016 1052   RDW 13.5 07/19/2016 1052   RDW 13.5 07/13/2014 1220   LYMPHSABS 1.1 07/19/2016 1052   LYMPHSABS 1.3 07/13/2014 1220   MONOABS 0.4 07/19/2016 1052   MONOABS 0.5 07/13/2014 1220   EOSABS 0.1 07/19/2016 1052   EOSABS 0.1 07/13/2014 1220   BASOSABS 0.0 07/19/2016 1052   BASOSABS 0.1 07/13/2014 1220      Chemistry      Component Value Date/Time   NA 139 07/19/2016 1052   NA 142 07/13/2014 1220   K 4.0 07/19/2016 1052   K 4.3 07/13/2014 1220   CL 105 07/19/2016 1052   CO2 28 07/19/2016 1052   CO2 30 (H) 07/13/2014 1220   BUN 14 07/19/2016 1052   BUN 12.6 07/13/2014 1220   CREATININE 0.57 07/19/2016 1052   CREATININE 0.7  07/13/2014 1220      Component Value Date/Time   CALCIUM 9.5 07/19/2016 1052   CALCIUM 9.9 07/13/2014 1220   ALKPHOS 58 07/19/2016 1052   ALKPHOS 78 07/13/2014 1220   AST 22 07/19/2016 1052   AST 17 07/13/2014 1220   ALT 18 07/19/2016 1052   ALT 14 07/13/2014 1220   BILITOT 0.7 07/19/2016 1052   BILITOT 0.58 07/13/2014 1220        PENDING LABS:   RADIOGRAPHIC STUDIES:  CLINICAL DATA:  History of left breast cancer, status post lumpectomy in January 2016. Annual examination.  EXAM: 2D DIGITAL DIAGNOSTIC BILATERAL MAMMOGRAM WITH CAD AND ADJUNCT TOMO  COMPARISON:  Previous exam(s).  ACR Breast Density Category c: The breast tissue is heterogeneously dense, which may obscure small masses.  FINDINGS: There are lumpectomy changes in the deep outer left breast. No mass, nonsurgical distortion, or suspicious microcalcification is identified in either breast to suggest malignancy.  Mammographic images were processed with CAD.  IMPRESSION: No evidence of malignancy in either breast. Lumpectomy changes on the left.  RECOMMENDATION: Diagnostic mammogram is suggested in 1 year. (Code:[id number]-B-01Y)  I have discussed the findings and recommendations with the patient. Results were also provided in writing at the conclusion of the visit. If applicable, a reminder letter will be sent to the patient regarding the next appointment.  BI-RADS CATEGORY  2: Benign.   Electronically Signed   By: Curlene Dolphin M.D.   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  S/P left lumpectomy by Dr. Dalbert Batman on 08/05/2014 followed by XRT (09/05/2014- 10/04/2014) followed by anti-endocrine therapy consisting of Tamoxifen.  She discontinued Tamoxifen after several months of taking due to side effects (09/22/2014- 10/21/2014). AND Osteoporosis, on Fosamax, Ca++, and  Vit D.   AND Chronic pain, managed by neurology/pain specialist.  No oncology role for labs today.  I personally reviewed and went over radiographic studies with the patient.  The results are noted within this dictation.  I personally reviewed the images in PACS.  Mammogram in December 2017 was BIRADS 2.  She will be due for her next mammogram in December 2018.  Order placed for diagnostic mammogram in December 2018.  This will be scheduled accordingly.  Continue Ca++, Vit D, and Fosamax.  Her bone density in 2016 demonstrated osteoporosis and was performed prior to initiation of anti-endocrine therapy for a baseline study in the event that aromatase inhibitor was chosen for treatment.  She was NOT started on an aromatase inhibitor and Tamoxifen was chosen instead, which would improve bone density.  She only took Tamoxifen for 1 month and this was discontinued due to intolerance.  Therefore, I recommend transitioning her osteoporosis care to her primary care provider or other provider in the future since this is not an oncology-related issue.    I have refilled her Fosamax x 3 months and she is advised that future refills should be from primary care provider or other specialist who will be managing her osteoporosis moving forward.  I have reviewed the risks, benefits, alternatives, and side effects of Fosamax, including, but not limited to, hypocalcemia and ONJ.  From a pain perspective, she notes that her neurologist is considering an implantable stimulator.  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  Return in 12 months for follow-up with breast exam.  Mammogram is to be completed in December 2018.  She knows to call us with any new breast concerns and we can see her much sooner.  ORDERS PLACED FOR THIS ENCOUNTER: Orders Placed This Encounter  Procedures  . MM DIAG BREAST TOMO BILATERAL    MEDICATIONS PRESCRIBED THIS ENCOUNTER: Meds ordered this encounter  Medications  . alendronate  (FOSAMAX) 70 MG tablet    Sig: Take 1 tablet (70 mg total) by mouth once a week. Take with a full glass of water on an empty stomach.    Dispense:  4 tablet    Refill:  3    Future refills by PCP    Order Specific Question:   Supervising Provider    Answer:   Brunetta Genera [[id number]]    THERAPY PLAN:  NCCN guidelines recommends the following surveillance for DCIS of breast post-operatively (1.2018):  1. Interval history and physical exam every 6-12 months for 5 years, then annually.  2. Mammogram every 12 months (first mammogram 6-12 months after breast conserving therapy, category 2B).  3. If treated with Tamoxifen, monitor per NCCN guidelines for Breast Cancer Risk Reduction.  All questions were answered. The patient knows to call the clinic with any problems, questions or concerns. We can certainly see the patient much sooner if necessary.  Patient and plan discussed with Dr. Twana First and she is in agreement with the aforementioned.   This note is electronically signed by: Doy Mince 02/19/2017 11:23 AM

## 2017-02-19 NOTE — Patient Instructions (Addendum)
[place] at [facility] Discharge Instructions  RECOMMENDATIONS MADE BY THE CONSULTANT AND ANY TEST RESULTS WILL BE SENT TO YOUR REFERRING PHYSICIAN.  You were seen today by Kirby Crigler PA-C. Mammogram  Dec 2018 Continue Fosamax weekly / Fosamax refilled x 3 months /  Transition osteoporosis care to PCP Return 12 month F/U Thank you for choosing [place] at [facility] to provide your oncology and hematology care.  To afford each patient quality time with our provider, please arrive at least 15 minutes before your scheduled appointment time.    If you have a lab appointment with the [facility] please come in thru the  Main Entrance and check in at the main information desk  You need to re-schedule your appointment should you arrive 10 or more minutes late.  We strive to give you quality time with our providers, and arriving late affects you and other patients whose appointments are after yours.  Also, if you no show three or more times for appointments you may be dismissed from the clinic at the providers discretion.     Again, thank you for choosing [facility].  Our hope is that these requests will decrease the amount of time that you wait before being seen by our physicians.       _____________________________________________________________  Should you have questions after your visit to [facility], please contact our office at [phone] between the hours of 8:30 a.m. and 4:30 p.m.  Voicemails left after 4:30 p.m. will not be returned until the following business day.  For prescription refill requests, have your pharmacy contact our office.       Resources For Cancer Patients and their Caregivers ? American Cancer Society: Can assist with transportation, wigs, general needs, runs Look Good Feel Better.        705-443-0318 ? Cancer Care: Provides financial assistance, online support groups,  medication/co-pay assistance.  1-800-813-HOPE 401-161-5071) ? [place] Assists [place] cancer patients and their families through emotional , educational and financial support.  (314)707-0440 ? [place] DSS Where to apply for food stamps, Medicaid and utility assistance. 623-536-4951 ? [facility]: Transportation to medical appointments. 9040036518 ? Social Security Administration: May apply for disability if have a Stage IV cancer. (780) 586-1120 409-861-3486 ? [employer], Disability and Transit Services: Assists with nutrition, care and transit needs. [facility] Support Programs: @10RELATIVEDAYS @ > Cancer Support Group  2nd Tuesday of the month 1pm-2pm, Journey Room  > Creative Journey  3rd Tuesday of the month 1130am-1pm, Journey Room  > Look Good Feel Better  1st Wednesday of the month 10am-12 noon, Journey Room (Call [place] to register [phone])

## 2017-02-19 NOTE — Assessment & Plan Note (Addendum)
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  S/P left lumpectomy by Dr. Dalbert Batman on 08/05/2014 followed by XRT (09/05/2014- 10/04/2014) followed by anti-endocrine therapy consisting of Tamoxifen.  She discontinued Tamoxifen after several months of taking due to side effects (09/22/2014- 10/21/2014). AND Osteoporosis, on Fosamax, Ca++, and Vit D.   AND Chronic pain, managed by neurology/pain specialist.  No oncology role for labs today.  I personally reviewed and went over radiographic studies with the patient.  The results are noted within this dictation.  I personally reviewed the images in PACS.  Mammogram in December 2017 was BIRADS 2.  She will be due for her next mammogram in December 2018.  Order placed for diagnostic mammogram in December 2018.  This will be scheduled accordingly.  Continue Ca++, Vit D, and Fosamax.  Her bone density in 2016 demonstrated osteoporosis and was performed prior to initiation of anti-endocrine therapy for a baseline study in the event that aromatase inhibitor was chosen for treatment.  She was NOT started on an aromatase inhibitor and Tamoxifen was chosen instead, which would improve bone density.  She only took Tamoxifen for 1 month and this was discontinued due to intolerance.  Therefore, I recommend transitioning her osteoporosis care to her primary care provider or other provider in the future since this is not an oncology-related issue.    I have refilled her Fosamax x 3 months and she is advised that future refills should be from primary care provider or other specialist who will be managing her osteoporosis moving forward.  I have reviewed the risks, benefits, alternatives, and side effects of Fosamax, including, but not limited to, hypocalcemia and ONJ.  From a pain perspective, she notes that her neurologist is considering an implantable stimulator.  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  Return in 12 months for follow-up with breast exam.  Mammogram is  to be completed in December 2018.  She knows to call us with any new breast concerns and we can see her much sooner.

## 2017-03-10 ENCOUNTER — Telehealth: Payer: Self-pay | Admitting: Gastroenterology

## 2017-03-10 NOTE — Telephone Encounter (Signed)
Spoke to patient, she has been having an increase in rectal bleeding. She also wanted to know when she is due for next colonoscopy, recall is in for 08/2017. After talking to her, encouraged her to make an appointment to be seen. Dr. Doyne Keel next available is not until 05/01/17, she is okay with seeing an APP. She will check with her husband to see what his schedule is and call us back to schedule OV.

## 2017-03-11 ENCOUNTER — Ambulatory Visit (INDEPENDENT_AMBULATORY_CARE_PROVIDER_SITE_OTHER): Payer: BLUE CROSS/BLUE SHIELD | Admitting: Physician Assistant

## 2017-03-11 ENCOUNTER — Encounter: Payer: Self-pay | Admitting: Physician Assistant

## 2017-03-11 VITALS — BP 132/84 | HR 72 | Ht 65.5 in | Wt 141.0 lb

## 2017-03-11 DIAGNOSIS — K625 Hemorrhage of anus and rectum: Secondary | ICD-10-CM

## 2017-03-11 DIAGNOSIS — K6289 Other specified diseases of anus and rectum: Secondary | ICD-10-CM | POA: Diagnosis not present

## 2017-03-11 DIAGNOSIS — K629 Disease of anus and rectum, unspecified: Secondary | ICD-10-CM

## 2017-03-11 MED ORDER — POLYETHYLENE GLYCOL 3350 17 GM/SCOOP PO POWD: ORAL | 1 refills | Status: DC

## 2017-03-11 MED ORDER — HYDROCORTISONE ACETATE 25 MG RE SUPP: 25.0000 mg | Freq: Two times a day (BID) | RECTAL | 1 refills | Status: DC

## 2017-03-11 MED ORDER — SUPREP BOWEL PREP KIT 17.5-3.13-1.6 GM/177ML PO SOLN: ORAL | 0 refills | Status: DC

## 2017-03-11 NOTE — Patient Instructions (Signed)
You have been scheduled for a colonoscopy. Please follow written instructions given to you at your visit today.  Please pick up your prep supplies at the pharmacy within the next 1-3 days. If you use inhalers (even only as needed), please bring them with you on the day of your procedure. Your physician has requested that you go to www.startemmi.com and enter the access code given to you at your visit today. This web site gives a general overview about your procedure. However, you should still follow specific instructions given to you by our office regarding your preparation for the procedure.   We have sent the following medications to your pharmacy for you to pick up at your convenience: Hydrocortisone Suppository - twice a day for 7 days  Miralax - take daily   If you are age 67 or older, your body mass index should be between 23-30. Your Body mass index is 23.11 kg/m. If this is out of the aforementioned range listed, please consider follow up with your Primary Care Provider.  If you are age 56 or younger, your body mass index should be between 19-25. Your Body mass index is 23.11 kg/m. If this is out of the aformentioned range listed, please consider follow up with your Primary Care Provider.

## 2017-03-11 NOTE — Progress Notes (Signed)
Chief Complaint: Rectal pain, rectal bleeding  HPI:  Amanda Terrell is a 61 year old Caucasian female with a past medical history of hepatitis C, arachnoiditis and multiple others listed below, who presents to clinic today with a complaint of rectal pain and rectal bleeding.   Please recall patient was last seen in clinic by Dr. Havery Moros on 07/11/15 and at that time was being seen for follow-up. She has a history of breast cancer which was discussed as well as HCV which was eradicated several years ago. She also had history of Barrett's esophagus as well as dysphagia. EGD was performed 07/12/15 and biopsies were negative for Barrett's. Patient's last colonoscopy was performed in 2009 with finding of internal hemorrhoids and diverticular and repeat was recommended in 10 years.    Today, the patient presents to clinic accompanied by her husband and explains that she has a nerve disorder of her back after multiple spinal surgeries. She has chronic back and nerve pain which radiates down into her right leg and most recently over the past few months into her rectum. Patient tells me this is somewhat of a throbbing pain/stabbing pain which comes and goes typically with movement. Rated as a 9-10/10 at it's worst. This is no worse after a bowel movement/during BM. She does have some chronic constipation for which she takes when necessary MiraLAX and Metamucil. Patient tells me that her regular bowel movements are either "small little balls", or a "longer thin piece of stool". She has had trouble with chronic hemorrhoids for which she uses over-the-counter hydrocortisone suppositories when necessary. She has been using these once daily for the past week and a half. These have not helped with her pain. She does have occasional bright red blood when wiping.   Patient denies a fever, chills, melena, weight loss, anorexia, nausea, vomiting, heartburn or reflux.  Past Medical History:  Diagnosis Date  . 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 Arthritis   . Axillary abscess    left  . Barrett's esophagus   . Breast cancer of upper-outer quadrant of left female breast ([place]) 07/11/2014  . Cellulitis and abscess of unspecified site   . Diverticulosis   . Esophageal stricture   . GERD (gastroesophageal reflux disease)   . Heart murmur   . Hepatitis C 1990   interferon treatment  . Hiatal hernia   . Hyperlipidemia   . IBS (irritable bowel syndrome)   . Internal hemorrhoids   . Mitral valve prolapse   . Neuropathy   . Other specified disease of hair and hair follicles   . PONV (postoperative nausea and vomiting)   . 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 Reflex sympathetic dystrophy   . Rosacea   . Scoliosis    adhesive arachnoditis-right side    Past Surgical History:  Procedure Laterality Date  . [place], 2006   due to scoliosis= total 5 back surgeries  . BREAST FIBROADENOMA SURGERY     bilateral  . BREAST LUMPECTOMY WITH RADIOACTIVE SEED LOCALIZATION Left 08/05/2014   Procedure: LEFT PARTICAL MASTECTOMY WITH RADIOACTIVE SEED LOCALIZATION;  Surgeon: Fanny Skates, MD;  Location: [facility];  Service: General;  Laterality: Left;  . [place], 1980, 1984  . COLONOSCOPY  2009  . HYSTEROSCOPY    . INCISE AND DRAIN ABCESS     left axillary abscess  . SPINAL FUSION    . spinal surgeries    . TONSILLECTOMY    .  UPPER GASTROINTESTINAL ENDOSCOPY      Current Outpatient Prescriptions  Medication Sig Dispense Refill  . alendronate (FOSAMAX) 70 MG tablet Take 1 tablet (70 mg total) by mouth once a week. Take with a full glass of water on an empty stomach. 4 tablet 3  . Calcium Carb-Cholecalciferol (CALCIUM 1000 + D) 1000-800 MG-UNIT TABS Take by mouth.    . Calcium Carbonate-Vit D-Min (CALCIUM 1200 PO) Take 1,200 mg by mouth daily.     . cholecalciferol (VITAMIN D) 1000 UNITS tablet Take 1,000 Units by mouth daily. Reported on  07/12/2015    . diphenhydrAMINE (BENADRYL) 25 mg capsule Take 25 mg by mouth at bedtime as needed for itching or sleep.     Marland Kitchen HYDROmorphone (DILAUDID) 4 MG tablet Take 2-4 mg by mouth every 6 (six) hours as needed for moderate pain or severe pain.     Marland Kitchen ibuprofen (ADVIL,MOTRIN) 200 MG tablet Take 400 mg by mouth every 4 (four) hours as needed for mild pain or moderate pain.    . Multiple Vitamin (MULTIVITAMIN) tablet Take 1 tablet by mouth daily. Reported on 07/12/2015    . omeprazole (PRILOSEC) 20 MG capsule Take 1 capsule (20 mg total) by mouth 2 (two) times daily before a meal. (Patient taking differently: Take 20 mg by mouth daily as needed (for GERD). ) 180 capsule 3  . polyethylene glycol powder (GLYCOLAX/MIRALAX) powder 17g mixed in water/juice daily as needed for constipation 255 g 1   No current facility-administered medications for this visit.     Allergies as of 03/11/2017 - Review Complete 03/11/2017  Allergen Reaction Noted  . Acetaminophen Other (See Comments) 08/18/2014    Family History  Problem Relation Age of Onset  . Heart disease Mother   . Brain cancer Father        Glioblastoma  . Melanoma Paternal Grandmother   . Breast cancer Maternal Aunt        in her 41s  . Lung cancer Maternal Aunt   . Colon cancer Neg Hx     Social History   Social History  . Marital status: Married    Spouse name: N/A  . Number of children: N/A  . Years of education: N/A   Occupational History  . Not on file.   Social History Main Topics  . Smoking status: Never Smoker  . Smokeless tobacco: Never Used  . Alcohol use 0.0 oz/week     Comment: 1 glass wine a month  . Drug use: No  . Sexual activity: Not on file   Other Topics Concern  . Not on file   Social History Narrative  . No narrative on file    Review of Systems:    Constitutional: No weight loss, fever or chills Cardiovascular: No chest pain  Respiratory: No SOB Gastrointestinal: See HPI and otherwise  negative   Physical Exam:  Vital signs: BP 132/84 (BP Location: Left Arm, Patient Position: Sitting, Cuff Size: Normal)   Pulse 72   Ht 5' 5.5" (1.664 m)   Wt 141 lb (64 kg)   BMI 23.11 kg/m   Constitutional:   Pleasant Caucasian female appears to be in NAD, Well developed, Well nourished, alert and cooperative Respiratory: Respirations even and unlabored. Lungs clear to auscultation bilaterally.   No wheezes, crackles, or rhonchi.  Cardiovascular: Normal S1, S2. No MRG. Regular rate and rhythm. No peripheral edema, cyanosis or pallor.  Gastrointestinal:  Soft, nondistended, nontender. No rebound or guarding. Normal bowel sounds. No appreciable  masses or hepatomegaly. Rectal: External: 2 hemorrhoids tags, no fissure, no ttp; Anoscopy: white round plaque (ulcer) on the posterior side of rectum, inflamed internal hemorrhoids all 3 columns  Msk:  Symmetrical , right leg straightened with limited ROM and some peripheral edema on this side Neurologic:  Alert and  oriented x4;  grossly normal neurologically.  Skin:   Dry and intact without significant lesions or rashes. Psychiatric:  Demonstrates good judgement and reason without abnormal affect or behaviors.  No recent labs or imaging.  Assessment: 1. Rectal pain: Intermittent throughout the day, sometimes severe and intense, likely most of this is related to back and nerve problems 2. Rectal bleeding: Consider relation to hemorrhoids versus ulcer seen at time of anoscopy 3. Abnormal anoscopy: Inflamed internal hemorrhoids as well as what looks like possibly a stercoral ulcer? It has been 9 years since patient's last colonoscopy and with new rectal pain and bleeding would recommend a colonoscopy at this time  Plan: 1. Scheduled patient for colonoscopy for further evaluation with Dr. Havery Moros in the [facility]. Did discuss risks, benefits, limitations and alternatives. The patient would like to wait until a week after she is back from vacation in 2  weeks. 2. Prescribed Hydrocortisone suppositories twice a day 7 days with one refill 3. Recommend patient use MiraLAX on a daily basis, discussed that she can use this up to 4 times daily 4. Discussed abnormal mucosa at time of exam today with the patient, this could be completely benign and possibly related to constipation, would recommend further evaluation with colonoscopy and biopsies as above 5. Patient to follow in clinic with Dr. Havery Moros per his recommendations after time of procedure.  Ellouise Newer, PA-C [facility] 03/11/2017, 3:43 PM  Cc: [name], MD

## 2017-03-11 NOTE — Progress Notes (Signed)
Agree with assessment and plan as outlined.  

## 2017-03-12 ENCOUNTER — Encounter: Payer: Self-pay | Admitting: Gastroenterology

## 2017-03-12 ENCOUNTER — Telehealth: Payer: Self-pay | Admitting: Gastroenterology

## 2017-03-12 NOTE — Telephone Encounter (Signed)
Anderson Malta, Do you have other recommendations? Anusol Suppository not covered. I will send a rebate card to pts pharmacy for the prep.

## 2017-03-13 ENCOUNTER — Other Ambulatory Visit: Payer: Self-pay

## 2017-03-13 MED ORDER — HYDROCORTISONE 1 % EX OINT: 1.0000 "application " | TOPICAL_OINTMENT | Freq: Two times a day (BID) | CUTANEOUS | 1 refills | Status: DC

## 2017-03-13 NOTE — Telephone Encounter (Signed)
Pt is calling back about her suppositories and about a different prep  [phone]

## 2017-03-13 NOTE — Telephone Encounter (Signed)
Called patient and left voicemail to call office. 

## 2017-03-13 NOTE — Telephone Encounter (Signed)
Rx sent for 1% HC oint. Instructions state to apply ointment to suppository and insert per rectum BID. Pt needs to be informed.

## 2017-03-13 NOTE — Telephone Encounter (Signed)
Yes, you can send hydrocortisone ointment and instruct her to buy OTC suppositories and wipe the ointment on there before inserting. Thanks-JLL

## 2017-03-14 NOTE — Telephone Encounter (Signed)
Spoke to patient and informed her to take OTC glycerin suppositories with hydrocortisone cream. As far as her prep we should have some samples next week and we will call her when they come in. Pt verbalized understanding.

## 2017-03-20 ENCOUNTER — Telehealth: Payer: Self-pay | Admitting: *Deleted

## 2017-03-20 NOTE — Telephone Encounter (Signed)
Called and LM .  Advised the insurance company does not cover the Anusol HC Suppositories.  Per Nicoletta Ba PA,she  has addressed this in [place] [facility]ence. The patient can get Preperation H suppositories at her pharmacy or [place] , [place], or [employer]. She can use them twice daily. LM for the patient to call me if she has any questions.,

## 2017-03-24 ENCOUNTER — Telehealth: Payer: Self-pay

## 2017-03-24 NOTE — Telephone Encounter (Signed)
Patient cannot afford prep, she had spoken to someone this morning who was going to put a Suprep at the front desk. There was not one there, gave patient one Suprep kit, she has her instructions.

## 2017-03-26 ENCOUNTER — Encounter: Payer: Self-pay | Admitting: Gastroenterology

## 2017-03-26 ENCOUNTER — Ambulatory Visit (AMBULATORY_SURGERY_CENTER): Payer: BLUE CROSS/BLUE SHIELD | Admitting: Gastroenterology

## 2017-03-26 VITALS — BP 123/79 | HR 72 | Temp 99.5°F | Resp 14 | Ht 65.5 in | Wt 141.0 lb

## 2017-03-26 DIAGNOSIS — K921 Melena: Secondary | ICD-10-CM | POA: Diagnosis not present

## 2017-03-26 DIAGNOSIS — K626 Ulcer of anus and rectum: Secondary | ICD-10-CM

## 2017-03-26 DIAGNOSIS — K5120 Ulcerative (chronic) proctitis without complications: Secondary | ICD-10-CM

## 2017-03-26 DIAGNOSIS — K6289 Other specified diseases of anus and rectum: Secondary | ICD-10-CM

## 2017-03-26 MED ORDER — SODIUM CHLORIDE 0.9 % IV SOLN: 500.0000 mL | INTRAVENOUS | Status: DC

## 2017-03-26 NOTE — Op Note (Signed)
[facility] Patient Name: Amanda Terrell Procedure Date: 03/26/2017 9:44 AM MRN: [id number] Endoscopist: Remo Lipps P. Tanishia Lemaster MD, MD Age: 61 Referring MD:  Date of Birth: 1956-04-04 Gender: Female Account #: [id number] Procedure:                Colonoscopy Indications:              Hematochezia, rectal pain, history of constipation Medicines:                Monitored Anesthesia Care Procedure:                Pre-Anesthesia Assessment:                           - Prior to the procedure, a History and Physical                            was performed, and patient medications and                            allergies were reviewed. The patient's tolerance of                            previous anesthesia was also reviewed. The risks                            and benefits of the procedure and the sedation                            options and risks were discussed with the patient.                            All questions were answered, and informed consent                            was obtained. Prior Anticoagulants: The patient has                            taken no previous anticoagulant or antiplatelet                            agents. ASA Grade Assessment: II - A patient with                            mild systemic disease. After reviewing the risks                            and benefits, the patient was deemed in                            satisfactory condition to undergo the procedure.                           After obtaining informed consent, the colonoscope  was passed under direct vision. Throughout the                            procedure, the patient's blood pressure, pulse, and                            oxygen saturations were monitored continuously. The                            Colonoscope was introduced through the anus and                            advanced to the the cecum, identified by   appendiceal orifice and ileocecal valve. The                            colonoscopy was technically difficult and complex                            due to a tortuous colon. The patient tolerated the                            procedure well. The quality of the bowel                            preparation was adequate. The ileocecal valve,                            appendiceal orifice, and rectum were photographed. Scope In: 9:50:26 AM Scope Out: 10:14:43 AM Scope Withdrawal Time: 0 hours 16 minutes 13 seconds  Total Procedure Duration: 0 hours 24 minutes 17 seconds  Findings:                 The perianal and digital rectal examinations were                            normal.                           Many medium-mouthed diverticula were found in the                            entire colon.                           Internal hemorrhoids were found during retroflexion.                           A single (solitary) small clean based ulcer was                            found in the distal rectum. No stigmata of recent                            bleeding were seen. A biopsy was taken from the  periphery of it with a cold forceps for histology.                           The colon was tortuous which prolonged the                            procedure.                           The exam was otherwise without abnormality. Complications:            No immediate complications. Estimated blood loss:                            Minimal. Estimated Blood Loss:     Estimated blood loss was minimal. Impression:               - Diverticulosis in the entire examined colon.                           - Internal hemorrhoids.                           - A single (solitary) ulcer in the distal rectum.                            Biopsied. This may be the cause for the patient's                            symptoms - perhaps a stercoral ulcer related to                             constipation.                           - Tortuous colon.                           - The examination was otherwise normal. Recommendation:           - Patient has a contact number available for                            emergencies. The signs and symptoms of potential                            delayed complications were discussed with the                            patient. Return to normal activities tomorrow.                            Written discharge instructions were provided to the                            patient. You will likely have a small amount of  blood in your next bowel movement given biopsy was                            taken near the rectal ulceration.                           - Resume previous diet.                           - Continue present medications.                           - Use Miralax every day, minimize straining                           - Await pathology results.                           - Repeat colonoscopy in 10 years for screening                            purposes. Remo Lipps P. Khyra Viscuso MD, MD 03/26/2017 10:20:42 AM This report has been signed electronically.

## 2017-03-26 NOTE — Patient Instructions (Signed)
YOU HAD AN ENDOSCOPIC PROCEDURE TODAY AT [facility]:   Refer to the procedure report that was given to you for any specific questions about what was found during the examination.  If the procedure report does not answer your questions, please call your gastroenterologist to clarify.  If you requested that your care partner not be given the details of your procedure findings, then the procedure report has been included in a sealed envelope for you to review at your convenience later.  YOU SHOULD EXPECT: Some feelings of bloating in the abdomen. Passage of more gas than usual.  Walking can help get rid of the air that was put into your GI tract during the procedure and reduce the bloating. If you had a lower endoscopy (such as a colonoscopy or flexible sigmoidoscopy) you may notice spotting of blood in your stool or on the toilet paper. If you underwent a bowel prep for your procedure, you may not have a normal bowel movement for a few days.  Please Note:  You might notice some irritation and congestion in your nose or some drainage.  This is from the oxygen used during your procedure.  There is no need for concern and it should clear up in a day or so.  SYMPTOMS TO REPORT IMMEDIATELY:   Following lower endoscopy (colonoscopy or flexible sigmoidoscopy):  Excessive amounts of blood in the stool  Significant tenderness or worsening of abdominal pains  Swelling of the abdomen that is new, acute  Fever of 100F or higher    For urgent or emergent issues, a gastroenterologist can be reached at any hour by calling [phone].   DIET:  We do recommend a small meal at first, but then you may proceed to your regular diet.  Drink plenty of fluids but you should avoid alcoholic beverages for 24 hours.  ACTIVITY:  You should plan to take it easy for the rest of today and you should NOT DRIVE or use heavy machinery until tomorrow (because of the sedation medicines used during the test).     FOLLOW UP: Our staff will call the number listed on your records the next business day following your procedure to check on you and address any questions or concerns that you may have regarding the information given to you following your procedure. If we do not reach you, we will leave a message.  However, if you are feeling well and you are not experiencing any problems, there is no need to return our call.  We will assume that you have returned to your regular daily activities without incident.  If any biopsies were taken you will be contacted by phone or by letter within the next 1-3 weeks.  Please call us at [phone] if you have not heard about the biopsies in 3 weeks.    SIGNATURES/CONFIDENTIALITY: You and/or your care partner have signed paperwork which will be entered into your electronic medical record.  These signatures attest to the fact that that the information above on your After Visit Summary has been reviewed and is understood.  Full responsibility of the confidentiality of this discharge information lies with you and/or your care-partner.  Diverticulosis, and hemorrhoid information given.

## 2017-03-26 NOTE — Progress Notes (Signed)
To recovery, report to RN, VSS. 

## 2017-03-26 NOTE — Progress Notes (Signed)
Called to room to assist during endoscopic procedure.  Patient ID and intended procedure confirmed with present staff. Received instructions for my participation in the procedure from the performing physician.  

## 2017-03-27 ENCOUNTER — Telehealth: Payer: Self-pay

## 2017-03-27 NOTE — Telephone Encounter (Signed)
  Follow up Call-  Call back number 03/26/2017 07/12/2015  Post procedure Call Back phone  # [phone]  Permission to leave phone message Yes Yes  Some recent data might be hidden     Patient questions:  Do you have a fever, pain , or abdominal swelling? No. Pain Score  0 *  Have you tolerated food without any problems? Yes.    Have you been able to return to your normal activities? Yes.    Do you have any questions about your discharge instructions: Diet   No. Medications  No. Follow up visit  No.  Do you have questions or concerns about your Care? No.  Actions: * If pain score is 4 or above: No action needed, pain <4.  No problems noted per pt. maw

## 2017-04-02 ENCOUNTER — Encounter: Payer: Self-pay | Admitting: Gastroenterology

## 2017-07-31 ENCOUNTER — Other Ambulatory Visit (HOSPITAL_COMMUNITY): Payer: Self-pay | Admitting: Oncology

## 2017-07-31 DIAGNOSIS — R928 Other abnormal and inconclusive findings on diagnostic imaging of breast: Secondary | ICD-10-CM

## 2017-08-05 ENCOUNTER — Ambulatory Visit (HOSPITAL_COMMUNITY)
Admission: RE | Admit: 2017-08-05 | Discharge: 2017-08-05 | Disposition: A | Discharge status: Home / Self Care | Payer: BLUE CROSS/BLUE SHIELD | Source: Ambulatory Visit | Attending: Oncology | Admitting: Oncology

## 2017-08-05 ENCOUNTER — Encounter (HOSPITAL_COMMUNITY): Payer: Self-pay

## 2017-08-05 ENCOUNTER — Ambulatory Visit (HOSPITAL_COMMUNITY)
Admission: RE | Admit: 2017-08-05 | Discharge: 2017-08-05 | Disposition: A | Discharge status: Home / Self Care | Payer: BLUE CROSS/BLUE SHIELD | Source: Ambulatory Visit | Attending: Adult Health | Admitting: Adult Health

## 2017-08-05 DIAGNOSIS — N6001 Solitary cyst of right breast: Secondary | ICD-10-CM | POA: Insufficient documentation

## 2017-08-05 DIAGNOSIS — Z170 Estrogen receptor positive status [ER+]: Secondary | ICD-10-CM | POA: Insufficient documentation

## 2017-08-05 DIAGNOSIS — R928 Other abnormal and inconclusive findings on diagnostic imaging of breast: Secondary | ICD-10-CM

## 2017-08-05 DIAGNOSIS — C50412 Malignant neoplasm of upper-outer quadrant of left female breast: Secondary | ICD-10-CM | POA: Diagnosis not present

## 2017-08-05 DIAGNOSIS — Z9889 Other specified postprocedural states: Secondary | ICD-10-CM | POA: Insufficient documentation

## 2017-08-05 DIAGNOSIS — R921 Mammographic calcification found on diagnostic imaging of breast: Secondary | ICD-10-CM | POA: Insufficient documentation

## 2017-08-05 HISTORY — DX: Personal history of irradiation: Z92.3

## 2017-09-25 ENCOUNTER — Other Ambulatory Visit (HOSPITAL_COMMUNITY): Payer: Self-pay | Admitting: Nurse Practitioner

## 2017-09-25 DIAGNOSIS — Z780 Asymptomatic menopausal state: Secondary | ICD-10-CM

## 2017-10-03 ENCOUNTER — Ambulatory Visit (HOSPITAL_COMMUNITY)
Admission: RE | Admit: 2017-10-03 | Discharge: 2017-10-03 | Disposition: A | Discharge status: Home / Self Care | Payer: BLUE CROSS/BLUE SHIELD | Source: Ambulatory Visit | Attending: Nurse Practitioner | Admitting: Nurse Practitioner

## 2017-10-03 DIAGNOSIS — M810 Age-related osteoporosis without current pathological fracture: Secondary | ICD-10-CM | POA: Insufficient documentation

## 2017-10-03 DIAGNOSIS — Z780 Asymptomatic menopausal state: Secondary | ICD-10-CM | POA: Diagnosis not present

## 2017-10-27 ENCOUNTER — Other Ambulatory Visit: Payer: Self-pay | Admitting: Physician Assistant

## 2018-02-24 ENCOUNTER — Encounter (HOSPITAL_COMMUNITY): Payer: Self-pay | Admitting: Internal Medicine

## 2018-02-24 ENCOUNTER — Other Ambulatory Visit: Payer: Self-pay

## 2018-02-24 ENCOUNTER — Inpatient Hospital Stay (HOSPITAL_COMMUNITY): Payer: BLUE CROSS/BLUE SHIELD | Attending: Hematology | Admitting: Internal Medicine

## 2018-02-24 ENCOUNTER — Ambulatory Visit (HOSPITAL_COMMUNITY): Payer: BLUE CROSS/BLUE SHIELD | Admitting: Adult Health

## 2018-02-24 VITALS — BP 128/69 | HR 86 | Temp 98.1°F | Resp 20 | Wt 137.0 lb

## 2018-02-24 DIAGNOSIS — Z923 Personal history of irradiation: Secondary | ICD-10-CM

## 2018-02-24 DIAGNOSIS — C50412 Malignant neoplasm of upper-outer quadrant of left female breast: Secondary | ICD-10-CM

## 2018-02-24 DIAGNOSIS — G8929 Other chronic pain: Secondary | ICD-10-CM

## 2018-02-24 DIAGNOSIS — Z170 Estrogen receptor positive status [ER+]: Secondary | ICD-10-CM | POA: Diagnosis not present

## 2018-02-24 DIAGNOSIS — B1920 Unspecified viral hepatitis C without hepatic coma: Secondary | ICD-10-CM

## 2018-02-24 DIAGNOSIS — M810 Age-related osteoporosis without current pathological fracture: Secondary | ICD-10-CM

## 2018-02-24 DIAGNOSIS — Z79899 Other long term (current) drug therapy: Secondary | ICD-10-CM | POA: Diagnosis not present

## 2018-02-24 DIAGNOSIS — Z780 Asymptomatic menopausal state: Secondary | ICD-10-CM

## 2018-02-24 DIAGNOSIS — Z86000 Personal history of in-situ neoplasm of breast: Secondary | ICD-10-CM

## 2018-02-24 DIAGNOSIS — K219 Gastro-esophageal reflux disease without esophagitis: Secondary | ICD-10-CM

## 2018-02-24 HISTORY — DX: Asymptomatic menopausal state: Z78.0

## 2018-02-24 NOTE — Progress Notes (Signed)
Diagnosis Malignant neoplasm of upper-outer quadrant of left breast in female, estrogen receptor positive ([place]) - Plan: MM DIAG BREAST TOMO BILATERAL  Postmenopausal  Staging Cancer Staging Breast cancer of upper-outer quadrant of left female breast [facility]) Staging form: Breast, AJCC 7th Edition - Clinical stage from 07/13/2014: Stage 0 (Tis (DCIS), N0, M0) - Unsigned - Pathologic stage from 08/08/2014: Stage Unknown (Tis (DCIS), NX, cM0) - Signed by Seward Grater, MD on 08/15/2014 Staging comments: Staged from lumpectomy specimen by Dr. Lyndon Code.    Assessment and Plan:  1.  Breast cancer of upper-outer quadrant of left female breast ([place]).  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  S/P left lumpectomy by Dr. Dalbert Batman on 08/05/2014 followed by XRT (09/05/2014- 10/04/2014) followed by anti-endocrine therapy consisting of Tamoxifen.  She discontinued Tamoxifen after several months of taking due to side effects (09/22/2014- 10/21/2014).  Pt had bilateral diagnostic mammogram done 08/05/2017 that showed  IMPRESSION: 1. Expected postoperative changes in the left breast. 2. Small cyst in the right breast. 3. Stable benign calcifications in the right breast.  She will be set up for bilateral diagnostic mammogram in 07/2018 and will follow-up at that time to go over results.  No palpable abnormalities noted on breast exam today.    2.  Osteoporosis, on Fosamax, Ca++, and Vit D.  Pt is having some GI symptoms with Fosamax.  I have discussed with her options of Prolia 60 mg every 6 months.  She would like to try Prolia.  Continue Calcium and Vitamin D.  She will have repeat BMD done 09/2019.    3.  Chronic pain, managed by neurology/pain specialist.  Follow-up as directed by their office.    4. Hep C.  Pt reports she was treated for this in the past.  She should continue to follow-up with GI for ongoing monitoring.    5.  GI symptoms.  Pt reports this occurs with Fosamax.  Will  determine if symptoms improve with change to Prolia.    Interval History:  Historical data obtained from the noted dated 02/19/2017.  62 y.o. female followed by Dr. 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  S/P left lumpectomy by Dr. Dalbert Batman on 08/05/2014 followed by XRT (09/05/2014- 10/04/2014) followed by anti-endocrine therapy consisting of Tamoxifen.  She discontinued Tamoxifen after several months of taking due to side effects (09/22/2014- 10/21/2014). AND Osteoporosis, on Fosamax, Ca++, and Vit D.   AND Chronic pain, managed by neurology/pain specialist  Current Status:  Pt is seen today for follow-up.  She reports problems tolerating Fosamax.      Breast cancer of upper-outer quadrant of left female breast ([place])   07/05/2014 Breast US    Palpable left breast mass; U/S revealed irregular mass in UOQ of left breast with associated pleomorphic calcs. Mass and calcs span 1.5 cm.       07/07/2014 Initial Biopsy    Left breast needle core biopsy, 2 o'clock position revealed intermediate grade DCIS with calcs. ER+ (100%), PR+ (59%).       07/07/2014 Initial Diagnosis    Breast cancer of upper-outer quadrant of left female breast      07/12/2014 Breast MRI    UOQ of left breast with 2.0 x 1.4 x 1.3 cm biopsy-proven ductal carcinoma. No evidence of malignancy elsewhere in either breast. No adenopathy.       08/05/2014 Definitive Surgery    Left lumpectomy (Dr. Dalbert Batman) revealed intermediate grade  DCIS, spanning 2.2 cm. Negative margins. 1 intramammary LN benign.       09/05/2014 - 10/04/2014 Radiation Therapy    Left breast: total radiation dose 42.72 Gy over 21 fractions.  Left breast boost: total radiation dose 10 Gy over 5 fractions.       09/22/2014 - 10/21/2014 Anti-estrogen oral therapy    Tamoxifen started by Dr. Whitney Muse.  Planned duration of treatment: 5 years.       10/16/2014 Adverse Reaction    Did not feel well. Worsening pain/joint pain      10/28/2014 Survivorship     Survivorship Care Plan given and reviewed with patient in-person.         Problem List Patient Active Problem List   Diagnosis Date Noted  . Postmenopausal [Z78.0] 02/24/2018  . Pain syndrome, chronic [G89.4] 08/06/2015  . Lower extremity pain, left [M79.605] 04/12/2015  . Osteoporosis [M81.0] 11/21/2014  . Breast cancer of upper-outer quadrant of left female breast ([place]) [C50.412] 07/11/2014  . Abscess of axilla, left [L02.412] 06/27/2011  . HYPERLIPIDEMIA [E78.5] 01/26/2008  . 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 NEUROPATHY [G58.9] 01/26/2008  . HEMORRHOIDS, INTERNAL [K64.8] 01/26/2008  . ESOPHAGEAL STRICTURE [K22.2] 01/26/2008  . GERD [K21.9] 01/26/2008  . BARRETTS ESOPHAGUS [K22.70] 01/26/2008  . DIVERTICULOSIS, COLON [K57.30] 01/26/2008  . IRRITABLE BOWEL SYNDROME [K58.9] 01/26/2008  . SCOLIOSIS [M41.20] 01/26/2008  . HEPATITIS C, HX OF [Z86.19] 01/26/2008  . MITRAL VALVE PROLAPSE, HX OF [Z86.79] 01/26/2008    Past Medical History Past Medical History:  Diagnosis Date  . 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 Arthritis   . Axillary abscess    left  . Barrett's esophagus   . Blood transfusion without reported diagnosis    1989  . Breast cancer of upper-outer quadrant of left female breast ([place]) 07/11/2014  . Cataract    both eyes in 2016  . Cellulitis and abscess of unspecified site   . Diverticulosis   . Esophageal stricture   . GERD (gastroesophageal reflux disease)   . Heart murmur   . Hepatitis C 1990   interferon treatment  . Hiatal hernia   . Hyperlipidemia   . IBS (irritable bowel syndrome)   . Internal hemorrhoids   . Mitral valve prolapse   . Neuropathy   . Osteoporosis   . Other specified disease of hair and hair follicles   . Personal history of radiation therapy 2016  . PONV (postoperative nausea and vomiting)   . Postmenopausal 02/24/2018  . 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 Reflex sympathetic dystrophy    . Rosacea   . Scoliosis    adhesive arachnoditis-right side    Past Surgical History Past Surgical History:  Procedure Laterality Date  . [place], 2006   due to scoliosis= total 5 back surgeries  . BREAST FIBROADENOMA SURGERY     bilateral  . BREAST LUMPECTOMY Left   . BREAST LUMPECTOMY WITH RADIOACTIVE SEED LOCALIZATION Left 08/05/2014   Procedure: LEFT PARTICAL MASTECTOMY WITH RADIOACTIVE SEED LOCALIZATION;  Surgeon: Fanny Skates, MD;  Location: [facility];  Service: General;  Laterality: Left;  . [place], 1980, 1984  . COLONOSCOPY  2009  . HYSTEROSCOPY    . INCISE AND DRAIN ABCESS     left axillary abscess  . SPINAL FUSION    . spinal surgeries    . TONSILLECTOMY    . UPPER GASTROINTESTINAL ENDOSCOPY  Family History Family History  Problem Relation Age of Onset  . Heart disease Mother   . Brain cancer Father        Glioblastoma  . Melanoma Paternal Grandmother   . Breast cancer Maternal Aunt        in her 33s  . Lung cancer Maternal Aunt   . Colon cancer Neg Hx   . Stomach cancer Neg Hx   . Rectal cancer Neg Hx   . Esophageal cancer Neg Hx      Social History  reports that she has never smoked. She has never used smokeless tobacco. She reports that she drinks alcohol. She reports that she does not use drugs.  Medications  Current Outpatient Medications:  .  alendronate (FOSAMAX) 70 MG tablet, Take 1 tablet (70 mg total) by mouth once a week. Take with a full glass of water on an empty stomach., Disp: 4 tablet, Rfl: 3 .  Calcium Carb-Cholecalciferol (CALCIUM 1000 + D) 1000-800 MG-UNIT TABS, Take by mouth., Disp: , Rfl:  .  Calcium Carbonate-Vit D-Min (CALCIUM 1200 PO), Take 1,200 mg by mouth daily. , Disp: , Rfl:  .  cholecalciferol (VITAMIN D) 1000 UNITS tablet, Take 1,000 Units by mouth daily. Reported on 07/12/2015, Disp: , Rfl:  .  diphenhydrAMINE (BENADRYL) 25 mg capsule, Take 25 mg by mouth at  bedtime as needed for itching or sleep. , Disp: , Rfl:  .  gabapentin (NEURONTIN) 300 MG capsule, Take 300 mg by mouth 3 (three) times daily., Disp: , Rfl:  .  hydrocortisone (ANUSOL-HC) 25 MG suppository, Place 1 suppository (25 mg total) rectally 2 (two) times daily., Disp: 14 suppository, Rfl: 1 .  hydrocortisone 1 % ointment, Apply 1 application topically 2 (two) times daily. Apply to suppository and insert per rectum twice daily., Disp: 30 g, Rfl: 1 .  HYDROmorphone (DILAUDID) 4 MG tablet, Take 2-4 mg by mouth every 6 (six) hours as needed for moderate pain or severe pain. , Disp: , Rfl:  .  ibuprofen (ADVIL,MOTRIN) 200 MG tablet, Take 400 mg by mouth every 4 (four) hours as needed for mild pain or moderate pain., Disp: , Rfl:  .  Multiple Vitamin (MULTIVITAMIN) tablet, Take 1 tablet by mouth daily. Reported on 07/12/2015, Disp: , Rfl:  .  omeprazole (PRILOSEC) 20 MG capsule, Take 1 capsule (20 mg total) by mouth 2 (two) times daily before a meal. (Patient taking differently: Take 20 mg by mouth daily as needed (for GERD). ), Disp: 180 capsule, Rfl: 3  Current Facility-Administered Medications:  .  0.9 %  sodium chloride infusion, 500 mL, Intravenous, Continuous, Armbruster, Carlota Raspberry, MD  Allergies Acetaminophen  Review of Systems Review of Systems - Oncology ROS negative other than joint pain.  Pt also reports GI symptoms related to Fosamax.     Physical Exam  Vitals Wt Readings from Last 3 Encounters:  02/24/18 137 lb (62.1 kg)  03/26/17 141 lb (64 kg)  03/11/17 141 lb (64 kg)   Temp Readings from Last 3 Encounters:  02/24/18 98.1 F (36.7 C) (Oral)  03/26/17 99.5 F (37.5 C)  08/23/16 98.6 F (37 C) (Oral)   BP Readings from Last 3 Encounters:  02/24/18 128/69  03/26/17 123/79  03/11/17 132/84   Pulse Readings from Last 3 Encounters:  02/24/18 86  03/26/17 72  03/11/17 72   Constitutional: Well-developed, well-nourished, and in no distress.   HENT: Head:  Normocephalic and atraumatic.  Mouth/Throat: No oropharyngeal exudate. Mucosa moist. Eyes: Pupils  are equal, round, and reactive to light. Conjunctivae are normal. No scleral icterus.  Neck: Normal range of motion. Neck supple. No JVD present.  Cardiovascular: Normal rate, regular rhythm and normal heart sounds.  Exam reveals no gallop and no friction rub.   No murmur heard. Pulmonary/Chest: Effort normal and breath sounds normal. No respiratory distress. No wheezes.No rales.  Abdominal: Soft. Bowel sounds are normal. No distension. There is no tenderness. There is no guarding.  Musculoskeletal: No edema or tenderness.  Lymphadenopathy: No cervical, axillary or supraclavicular adenopathy.  Neurological: Alert and oriented to person, place, and time. No cranial nerve deficit.  Skin: Skin is warm and dry. No rash noted. No erythema. No pallor.  Psychiatric: Affect and judgment normal.  Bilateral breast exam:  Chaperone present.  Left lumpectomy site healed well.  No palpable abnormalities noted.  Right breast shows no palpable dominant masses.    Labs No visits with results within 3 Day(s) from this visit.  Latest known visit with results is:  Appointment on 07/19/2016  Component Date Value Ref Range Status  . WBC 07/19/2016 6.4  4.0 - 10.5 K/uL Final  . RBC 07/19/2016 4.81  3.87 - 5.11 MIL/uL Final  . Hemoglobin 07/19/2016 14.0  12.0 - 15.0 g/dL Final  . HCT 07/19/2016 43.9  36.0 - 46.0 % Final  . MCV 07/19/2016 91.3  78.0 - 100.0 fL Final  . MCH 07/19/2016 29.1  26.0 - 34.0 pg Final  . MCHC 07/19/2016 31.9  30.0 - 36.0 g/dL Final  . RDW 07/19/2016 13.5  11.5 - 15.5 % Final  . Platelets 07/19/2016 289  150 - 400 K/uL Final  . Neutrophils Relative % 07/19/2016 74  % Final  . Neutro Abs 07/19/2016 4.7  1.7 - 7.7 K/uL Final  . Lymphocytes Relative 07/19/2016 18  % Final  . Lymphs Abs 07/19/2016 1.1  0.7 - 4.0 K/uL Final  . Monocytes Relative 07/19/2016 7  % Final  . Monocytes Absolute  07/19/2016 0.4  0.1 - 1.0 K/uL Final  . Eosinophils Relative 07/19/2016 2  % Final  . Eosinophils Absolute 07/19/2016 0.1  0.0 - 0.7 K/uL Final  . Basophils Relative 07/19/2016 1  % Final  . Basophils Absolute 07/19/2016 0.0  0.0 - 0.1 K/uL Final  . Sodium 07/19/2016 139  135 - 145 mmol/L Final  . Potassium 07/19/2016 4.0  3.5 - 5.1 mmol/L Final  . Chloride 07/19/2016 105  101 - 111 mmol/L Final  . CO2 07/19/2016 28  22 - 32 mmol/L Final  . Glucose, Bld 07/19/2016 94  65 - 99 mg/dL Final  . BUN 07/19/2016 14  6 - 20 mg/dL Final  . Creatinine, Ser 07/19/2016 0.57  0.44 - 1.00 mg/dL Final  . Calcium 07/19/2016 9.5  8.9 - 10.3 mg/dL Final  . Total Protein 07/19/2016 7.1  6.5 - 8.1 g/dL Final  . Albumin 07/19/2016 4.2  3.5 - 5.0 g/dL Final  . AST 07/19/2016 22  15 - 41 U/L Final  . ALT 07/19/2016 18  14 - 54 U/L Final  . Alkaline Phosphatase 07/19/2016 58  38 - 126 U/L Final  . Total Bilirubin 07/19/2016 0.7  0.3 - 1.2 mg/dL Final  . GFR calc non Af Amer 07/19/2016 >60  >60 mL/min Final  . GFR calc Af Amer 07/19/2016 >60  >60 mL/min Final   Comment: (NOTE) The eGFR has been calculated using the CKD EPI equation. This calculation has not been validated in all clinical situations. eGFR's persistently <  60 mL/min signify possible Chronic Kidney Disease.   . Anion gap 07/19/2016 6  5 - 15 Final     Pathology Orders Placed This Encounter  Procedures  . MM DIAG BREAST TOMO BILATERAL    Standing Status:   Future    Standing Expiration Date:   02/25/2019    Order Specific Question:   Reason for Exam (SYMPTOM  OR DIAGNOSIS REQUIRED)    Answer:   Left breast DCIS    Order Specific Question:   Preferred imaging location?    Answer:   [facility]       Zoila Shutter MD

## 2018-02-24 NOTE — Patient Instructions (Addendum)
[employer] at [facility] Discharge Instructions  Today you saw Dr. Walden Field. According to your bone density report, you have osteoporosis. We can check to see if we can get you approved for Prolia. This is a subcutaneous injection that treats osteoporosis. The injection is given by a nurse into your abdominal tissue every 6 months. We have to check your calcium level every 6 months prior to your prolia injection. You would stop taking the Fosamax once you start the Prolia injection.   Your dentist would need to know that you are on Prolia. This medication pulls calcium out of the bloodstream and into your bone to strengthen the bone and keep the bone strong. This could potentially lower the calcium in your bloodstream. So we have to keep a check on your calcium level.   You need to take your calcium and vitamin d everyday!  Return to see [place] in January after mammogram.   Next mammogram due in January.     What is this medicine? DENOSUMAB (den oh sue mab) slows bone breakdown. Prolia is used to treat osteoporosis in women after menopause and in men. Delton See is used to treat a high calcium level due to cancer and to prevent bone fractures and other bone problems caused by multiple myeloma or cancer bone metastases. Delton See is also used to treat giant cell tumor of the bone. This medicine may be used for other purposes; ask your health care provider or pharmacist if you have questions. COMMON BRAND NAME(S): Prolia, XGEVA What should I tell my health care provider before I take this medicine? They need to know if you have any of these conditions: -dental disease -having surgery or tooth extraction -infection -kidney disease -low levels of calcium or Vitamin D in the blood -malnutrition -on hemodialysis -skin conditions or sensitivity -thyroid or parathyroid disease -an unusual reaction to denosumab, other medicines, foods, dyes, or preservatives -pregnant or trying to get  pregnant -breast-feeding How should I use this medicine? This medicine is for injection under the skin. It is given by a health care professional in a hospital or clinic setting. If you are getting Prolia, a special MedGuide will be given to you by the pharmacist with each prescription and refill. Be sure to read this information carefully each time. For Prolia, talk to your pediatrician regarding the use of this medicine in children. Special care may be needed. For Delton See, talk to your pediatrician regarding the use of this medicine in children. While this drug may be prescribed for children as young as 13 years for selected conditions, precautions do apply. Overdosage: If you think you have taken too much of this medicine contact a poison control center or emergency room at once. NOTE: This medicine is only for you. Do not share this medicine with others. What if I miss a dose? It is important not to miss your dose. Call your doctor or health care professional if you are unable to keep an appointment. What may interact with this medicine? Do not take this medicine with any of the following medications: -other medicines containing denosumab This medicine may also interact with the following medications: -medicines that lower your chance of fighting infection -steroid medicines like prednisone or cortisone This list may not describe all possible interactions. Give your health care provider a list of all the medicines, herbs, non-prescription drugs, or dietary supplements you use. Also tell them if you smoke, drink alcohol, or use illegal drugs. Some items may interact with  your medicine. What should I watch for while using this medicine? Visit your doctor or health care professional for regular checks on your progress. Your doctor or health care professional may order blood tests and other tests to see how you are doing. Call your doctor or health care professional for advice if you get a fever,  chills or sore throat, or other symptoms of a cold or flu. Do not treat yourself. This drug may decrease your body's ability to fight infection. Try to avoid being around people who are sick. You should make sure you get enough calcium and vitamin D while you are taking this medicine, unless your doctor tells you not to. Discuss the foods you eat and the vitamins you take with your health care professional. See your dentist regularly. Brush and floss your teeth as directed. Before you have any dental work done, tell your dentist you are receiving this medicine. Do not become pregnant while taking this medicine or for 5 months after stopping it. Talk with your doctor or health care professional about your birth control options while taking this medicine. Women should inform their doctor if they wish to become pregnant or think they might be pregnant. There is a potential for serious side effects to an unborn child. Talk to your health care professional or pharmacist for more information. What side effects may I notice from receiving this medicine? Side effects that you should report to your doctor or health care professional as soon as possible: -allergic reactions like skin rash, itching or hives, swelling of the face, lips, or tongue -bone pain -breathing problems -dizziness -jaw pain, especially after dental work -redness, blistering, peeling of the skin -signs and symptoms of infection like fever or chills; cough; sore throat; pain or trouble passing urine -signs of low calcium like fast heartbeat, muscle cramps or muscle pain; pain, tingling, numbness in the hands or feet; seizures -unusual bleeding or bruising -unusually weak or tired Side effects that usually do not require medical attention (report to your doctor or health care professional if they continue or are bothersome): -constipation -diarrhea -headache -joint pain -loss of appetite -muscle pain -runny nose -tiredness -upset  stomach This list may not describe all possible side effects. Call your doctor for medical advice about side effects. You may report side effects to FDA at 1-800-FDA-1088. Where should I keep my medicine? This medicine is only given in a clinic, doctor's office, or other health care setting and will not be stored at home. NOTE: This sheet is a summary. It may not cover all possible information. If you have questions about this medicine, talk to your doctor, pharmacist, or health care provider.  2018 Elsevier/Gold Standard (2016-08-06 19:17:21)    Osteoporosis Osteoporosis happens when your bones become thinner and weaker. Weak bones can break (fracture) more easily when you slip or fall. Bones most at risk of breaking are in the hip, wrist, and spine. Follow these instructions at home:  Get enough calcium and vitamin D. These nutrients are good for your bones.  Exercise as told by your doctor.  Do not use any tobacco products. This includes cigarettes, chewing tobacco, and electronic cigarettes. If you need help quitting, ask your doctor.  Limit the amount of alcohol you drink.  Take medicines only as told by your doctor.  Keep all follow-up visits as told by your doctor. This is important.  Take care at home to prevent falls. Some ways to do this are: ? Keep rooms well lit  and tidy. ? Put safety rails on your stairs. ? Put a rubber mat in the bathroom and other places that are often wet or slippery. Get help right away if:  You fall.  You hurt yourself. This information is not intended to replace advice given to you by your health care provider. Make sure you discuss any questions you have with your health care provider. Document Released: 10/07/2011 Document Revised: 12/21/2015 Document Reviewed: 12/23/2013 Elsevier Interactive Patient Education  2018 [employer].    Thank you for choosing [place] at [facility] to provide your oncology and  hematology care.  To afford each patient quality time with our provider, please arrive at least 15 minutes before your scheduled appointment time.   If you have a lab appointment with the [facility] please come in thru the  Main Entrance and check in at the main information desk  You need to re-schedule your appointment should you arrive 10 or more minutes late.  We strive to give you quality time with our providers, and arriving late affects you and other patients whose appointments are after yours.  Also, if you no show three or more times for appointments you may be dismissed from the clinic at the providers discretion.     Again, thank you for choosing [facility]  Our hope is that these requests will decrease the amount of time that you wait before being seen by our physicians.       _____________________________________________________________  Should you have questions after your visit to [facility], please contact our office at [phone] between the hours of 8:00 a.m. and 4:30 p.m.  Voicemails left after 4:00 p.m. will not be returned until the following business day.  For prescription refill requests, have your pharmacy contact our office and allow 72 hours.    Cancer Center Support Programs:   > Cancer Support Group  2nd Tuesday of the month 1pm-2pm, Journey Room

## 2018-03-03 ENCOUNTER — Ambulatory Visit (HOSPITAL_COMMUNITY): Payer: BLUE CROSS/BLUE SHIELD

## 2018-03-03 ENCOUNTER — Other Ambulatory Visit (HOSPITAL_COMMUNITY): Payer: BLUE CROSS/BLUE SHIELD

## 2018-03-06 ENCOUNTER — Other Ambulatory Visit (HOSPITAL_COMMUNITY): Payer: Self-pay

## 2018-03-06 DIAGNOSIS — Z170 Estrogen receptor positive status [ER+]: Principal | ICD-10-CM

## 2018-03-06 DIAGNOSIS — C50412 Malignant neoplasm of upper-outer quadrant of left female breast: Secondary | ICD-10-CM

## 2018-03-06 DIAGNOSIS — M810 Age-related osteoporosis without current pathological fracture: Secondary | ICD-10-CM

## 2018-03-06 DIAGNOSIS — Z780 Asymptomatic menopausal state: Secondary | ICD-10-CM

## 2018-03-10 ENCOUNTER — Encounter (HOSPITAL_COMMUNITY): Payer: Self-pay

## 2018-03-10 ENCOUNTER — Inpatient Hospital Stay (HOSPITAL_COMMUNITY): Payer: BLUE CROSS/BLUE SHIELD

## 2018-03-10 ENCOUNTER — Inpatient Hospital Stay (HOSPITAL_COMMUNITY): Payer: BLUE CROSS/BLUE SHIELD | Attending: Hematology

## 2018-03-10 ENCOUNTER — Other Ambulatory Visit: Payer: Self-pay

## 2018-03-10 VITALS — BP 130/74 | HR 88 | Temp 98.6°F | Resp 20

## 2018-03-10 DIAGNOSIS — M810 Age-related osteoporosis without current pathological fracture: Secondary | ICD-10-CM

## 2018-03-10 DIAGNOSIS — Z170 Estrogen receptor positive status [ER+]: Secondary | ICD-10-CM

## 2018-03-10 DIAGNOSIS — Z780 Asymptomatic menopausal state: Secondary | ICD-10-CM

## 2018-03-10 DIAGNOSIS — C50412 Malignant neoplasm of upper-outer quadrant of left female breast: Secondary | ICD-10-CM

## 2018-03-10 LAB — COMPREHENSIVE METABOLIC PANEL
ALT: 17 U/L (ref 0–44)
AST: 18 U/L (ref 15–41)
Albumin: 3.9 g/dL (ref 3.5–5.0)
Alkaline Phosphatase: 60 U/L (ref 38–126)
Anion gap: 5 (ref 5–15)
BUN: 14 mg/dL (ref 8–23)
CO2: 32 mmol/L (ref 22–32)
Calcium: 9.4 mg/dL (ref 8.9–10.3)
Chloride: 103 mmol/L (ref 98–111)
Creatinine, Ser: 0.52 mg/dL (ref 0.44–1.00)
GFR calc Af Amer: >60 mL/min (ref >60)
GFR calc non Af Amer: >60 mL/min (ref >60)
Glucose, Bld: 98 mg/dL (ref 70–99)
Potassium: 4.6 mmol/L (ref 3.5–5.1)
Sodium: 140 mmol/L (ref 135–145)
Total Bilirubin: 0.8 mg/dL (ref 0.3–1.2)
Total Protein: 6.8 g/dL (ref 6.5–8.1)

## 2018-03-10 MED ORDER — DENOSUMAB 60 MG/ML ~~LOC~~ SOSY
60.0000 mg | PREFILLED_SYRINGE | Freq: Once | SUBCUTANEOUS | Status: AC
  Administered 2018-03-10: 60 mg via SUBCUTANEOUS
  Filled 2018-03-10: qty 1

## 2018-03-10 MED ORDER — SODIUM CHLORIDE 0.9 % IV SOLN: Freq: Once | INTRAVENOUS | Status: DC

## 2018-03-10 NOTE — Progress Notes (Signed)
Amanda Terrell presents today for injection per MD orders. Prolia 60 mg administered SQ in left Upper Arm. Administration without incident. Patient tolerated well.  Treatment given per orders. Patient tolerated it well without problems. Vitals stable and discharged home from clinic ambulatory. Follow up as scheduled.

## 2018-03-10 NOTE — Patient Instructions (Signed)
[place] at [facility] Discharge Instructions  Prolia given today. Follow up as scheduled.   Thank you for choosing [place] at [facility] to provide your oncology and hematology care.  To afford each patient quality time with our provider, please arrive at least 15 minutes before your scheduled appointment time.   If you have a lab appointment with the [facility] please come in thru the  Main Entrance and check in at the main information desk  You need to re-schedule your appointment should you arrive 10 or more minutes late.  We strive to give you quality time with our providers, and arriving late affects you and other patients whose appointments are after yours.  Also, if you no show three or more times for appointments you may be dismissed from the clinic at the providers discretion.     Again, thank you for choosing [facility].  Our hope is that these requests will decrease the amount of time that you wait before being seen by our physicians.       _____________________________________________________________  Should you have questions after your visit to [facility], please contact our office at [phone] between the hours of 8:00 a.m. and 4:30 p.m.  Voicemails left after 4:00 p.m. will not be returned until the following business day.  For prescription refill requests, have your pharmacy contact our office and allow 72 hours.    Cancer Center Support Programs:   > Cancer Support Group  2nd Tuesday of the month 1pm-2pm, Journey Room

## 2018-06-16 ENCOUNTER — Telehealth: Payer: Self-pay | Admitting: Gastroenterology

## 2018-06-16 NOTE — Telephone Encounter (Signed)
We can check lipid panel if she wants to do this, but she should follow up with her PCP to discuss results, I do not manage lipids. Thanks

## 2018-06-16 NOTE — Telephone Encounter (Signed)
Routed to Dr. Armbruster. 

## 2018-06-16 NOTE — Telephone Encounter (Signed)
Since it has been almost 3 years since patient was last seen by Dr. Havery Moros in the office and she was not sure what labs she has done on a yearly basis to follow up on hepatitis C, I have scheduled her a follow up visit and labs can be ordered based on that visit. Patient wants to have cholesterol checked and she is aware that she will need to have her PCP look at those results and advise.

## 2018-07-10 ENCOUNTER — Ambulatory Visit: Payer: BLUE CROSS/BLUE SHIELD | Admitting: Gastroenterology

## 2018-07-10 ENCOUNTER — Other Ambulatory Visit (INDEPENDENT_AMBULATORY_CARE_PROVIDER_SITE_OTHER): Payer: BLUE CROSS/BLUE SHIELD

## 2018-07-10 ENCOUNTER — Encounter: Payer: Self-pay | Admitting: Gastroenterology

## 2018-07-10 VITALS — BP 102/60 | HR 77 | Ht 64.0 in | Wt 141.4 lb

## 2018-07-10 DIAGNOSIS — K5900 Constipation, unspecified: Secondary | ICD-10-CM | POA: Diagnosis not present

## 2018-07-10 DIAGNOSIS — Z8619 Personal history of other infectious and parasitic diseases: Secondary | ICD-10-CM | POA: Diagnosis not present

## 2018-07-10 DIAGNOSIS — K219 Gastro-esophageal reflux disease without esophagitis: Secondary | ICD-10-CM

## 2018-07-10 LAB — PROTIME-INR
INR: 1 ratio (ref 0.8–1.0)
PROTHROMBIN TIME: 11.3 s (ref 9.6–13.1)

## 2018-07-10 LAB — CBC WITH DIFFERENTIAL/PLATELET
BASOS ABS: 0 10*3/uL (ref 0.0–0.1)
Basophils Relative: 0.7 % (ref 0.0–3.0)
Eosinophils Absolute: 0.2 10*3/uL (ref 0.0–0.7)
Eosinophils Relative: 2.6 % (ref 0.0–5.0)
HCT: 44.3 % (ref 36.0–46.0)
Hemoglobin: 14.8 g/dL (ref 12.0–15.0)
LYMPHS ABS: 1 10*3/uL (ref 0.7–4.0)
Lymphocytes Relative: 14.9 % (ref 12.0–46.0)
MCHC: 33.3 g/dL (ref 30.0–36.0)
MCV: 86.9 fl (ref 78.0–100.0)
MONOS PCT: 7.3 % (ref 3.0–12.0)
Monocytes Absolute: 0.5 10*3/uL (ref 0.1–1.0)
NEUTROS PCT: 74.5 % (ref 43.0–77.0)
Neutro Abs: 4.9 10*3/uL (ref 1.4–7.7)
Platelets: 250 10*3/uL (ref 150.0–400.0)
RBC: 5.1 Mil/uL (ref 3.87–5.11)
RDW: 13.9 % (ref 11.5–15.5)
WBC: 6.6 10*3/uL (ref 4.0–10.5)

## 2018-07-10 LAB — LIPID PANEL
CHOLESTEROL: 169 mg/dL (ref 0–200)
HDL: 63.6 mg/dL (ref >39.00)
LDL CALC: 92 mg/dL (ref 0–99)
NonHDL: 105.16
Total CHOL/HDL Ratio: 3
Triglycerides: 66 mg/dL (ref 0.0–149.0)
VLDL: 13.2 mg/dL (ref 0.0–40.0)

## 2018-07-10 MED ORDER — OMEPRAZOLE 20 MG PO CPDR: 20.0000 mg | DELAYED_RELEASE_CAPSULE | Freq: Every day | ORAL | 3 refills | Status: DC | PRN

## 2018-07-10 NOTE — Progress Notes (Signed)
HPI :  62 year old female here for a follow-up visit.  She has a history of dysphagia and reflux. Since the last time I saw her she had an EGD in December 2016 showing a mild Schatzki ring which was dilated to 20 mm along with a 3cm hiatal hernia. She denies having any dysphagia at this time which is bothering her. She has mild occasional reflux symptoms for which she takes omeprazole 20 mg a day as needed. She does not take this too frequently and mostly manages this with diet. Overall doing well in this regard.  She has been taking some MiraLAX for constipation. She takes MiraLAX every other day and that regimen works quite well for her, no problems with her bowels at this time.  She's had a history of hepatitis C in the past. She was treated with a regimen of interferon and ribavirin several years ago. She's had negative testing of HCV viral load in 2012, 2013, and in 2015. She asked if she requires in her further testing for hepatitis C. She has no new exposures for that. Her liver enzymes were normal in August. She had a prior CT scan in 2011 which showed a normal liver. She has questions about if she's had any damage to her liver from hepatitis C and her risk for cirrhosis, and if she needs HCV testing again.  She says she is fasting today and hoping to have her lipids drawn, and in a while since she's had her cholesterol checked.  Endoscopic history: EGD 07/12/2015 - mild Shatski ring at GEJ, dilated to 108mm, 3cm HH, benign gastric polyps / diverticulum,   Colonoscopy 03/26/2017 - pancolonic diverticulosis, internal hemorrhoids, solitary rectal ulcer - repeat colonoscopy in 10 years  EGD 05/2012 - no Barrett's, irregular zline EGD 04/2009 - distal  Esophageal stricture, no Barrett's EGD 04/2007 - 1cm suspected Barrett's however path negative for Barrett's, dilated to 48Fr EGD 04/2006 -  GEJ stricture, biopsies show (+) nondysplastic Barrett's, short segment 1cm or less Colonoscopy  01/2008 - hemorrhoids, diverticulosis, no polyps   Past Medical History:  Diagnosis Date  . 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 Arthritis   . Axillary abscess    left  . Barrett's esophagus   . Blood transfusion without reported diagnosis    1989  . Breast cancer of upper-outer quadrant of left female breast ([place]) 07/11/2014  . Cataract    both eyes in 2016  . Cellulitis and abscess of unspecified site   . Diverticulosis   . Esophageal stricture   . GERD (gastroesophageal reflux disease)   . Heart murmur   . Hepatitis C 1990   interferon treatment  . Hiatal hernia   . Hyperlipidemia   . IBS (irritable bowel syndrome)   . Internal hemorrhoids   . Mitral valve prolapse   . Neuropathy   . Osteoporosis   . Other specified disease of hair and hair follicles   . Personal history of radiation therapy 2016  . PONV (postoperative nausea and vomiting)   . Postmenopausal 02/24/2018  . 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 Reflex sympathetic dystrophy   . Rosacea   . Scoliosis    adhesive arachnoditis-right side     Past Surgical History:  Procedure Laterality Date  . [place], 2006   due to scoliosis= total 5 back surgeries  . BREAST FIBROADENOMA SURGERY     bilateral  . BREAST LUMPECTOMY Left   . BREAST  LUMPECTOMY WITH RADIOACTIVE SEED LOCALIZATION Left 08/05/2014   Procedure: LEFT PARTICAL MASTECTOMY WITH RADIOACTIVE SEED LOCALIZATION;  Surgeon: Fanny Skates, MD;  Location: [facility];  Service: General;  Laterality: Left;  . [place], 1980, 1984  . COLONOSCOPY  2009  . HYSTEROSCOPY    . INCISE AND DRAIN ABCESS     left axillary abscess  . SPINAL FUSION    . spinal surgeries    . TONSILLECTOMY    . UPPER GASTROINTESTINAL ENDOSCOPY     Family History  Problem Relation Age of Onset  . Heart disease Mother   . Brain cancer Father        Glioblastoma  . Melanoma Paternal Grandmother   . Breast cancer  Maternal Aunt        in her 73s  . Lung cancer Maternal Aunt   . Colon cancer Neg Hx   . Stomach cancer Neg Hx   . Rectal cancer Neg Hx   . Esophageal cancer Neg Hx    Social History   Tobacco Use  . Smoking status: Never Smoker  . Smokeless tobacco: Never Used  Substance Use Topics  . Alcohol use: Yes    Alcohol/week: 0.0 standard drinks    Comment: 1 glass wine a month  . Drug use: No   Current Outpatient Medications  Medication Sig Dispense Refill  . Calcium Carb-Cholecalciferol (CALCIUM 1000 + D) 1000-800 MG-UNIT TABS Take by mouth.    . Calcium Carbonate-Vit D-Min (CALCIUM 1200 PO) Take 1,200 mg by mouth daily.     . cholecalciferol (VITAMIN D) 1000 UNITS tablet Take 1,000 Units by mouth daily. Reported on 07/12/2015    . diphenhydrAMINE (BENADRYL) 25 mg capsule Take 25 mg by mouth at bedtime as needed for itching or sleep.     Marland Kitchen gabapentin (NEURONTIN) 300 MG capsule Take 300 mg by mouth 3 (three) times daily.    . hydrocortisone (ANUSOL-HC) 25 MG suppository Place 1 suppository (25 mg total) rectally 2 (two) times daily. (Patient not taking: Reported on 03/10/2018) 14 suppository 1  . HYDROmorphone (DILAUDID) 4 MG tablet Take 2-4 mg by mouth every 6 (six) hours as needed for moderate pain or severe pain.     Marland Kitchen ibuprofen (ADVIL,MOTRIN) 200 MG tablet Take 400 mg by mouth every 4 (four) hours as needed for mild pain or moderate pain.    . Multiple Vitamin (MULTIVITAMIN) tablet Take 1 tablet by mouth daily. Reported on 07/12/2015    . omeprazole (PRILOSEC) 20 MG capsule Take 1 capsule (20 mg total) by mouth 2 (two) times daily before a meal. (Patient not taking: Reported on 03/10/2018) 180 capsule 3   No current facility-administered medications for this visit.    Allergies  Allergen Reactions  . Acetaminophen Other (See Comments)    Liver sensitivity secondary to h/o hep. c     Review of Systems: All systems reviewed and negative except where noted in HPI.   Lab Results    Component Value Date   WBC 6.4 07/19/2016   HGB 14.0 07/19/2016   HCT 43.9 07/19/2016   MCV 91.3 07/19/2016   PLT 289 07/19/2016    Lab Results  Component Value Date   CREATININE 0.52 03/10/2018   BUN 14 03/10/2018   NA 140 03/10/2018   K 4.6 03/10/2018   CL 103 03/10/2018   CO2 32 03/10/2018    Lab Results  Component Value Date   ALT 17 03/10/2018   AST 18 03/10/2018  ALKPHOS 60 03/10/2018   BILITOT 0.8 03/10/2018   Lab Results  Component Value Date   INR 1.0 ratio 02/07/2009     Physical Exam: BP 102/60   Pulse 77   Ht 5\' 4"  (1.626 m)   Wt 141 lb 6.4 oz (64.1 kg)   SpO2 98%   BMI 24.27 kg/m  Constitutional: Pleasant,well-developed, female in no acute distress. HEENT: Normocephalic and atraumatic. Conjunctivae are normal. No scleral icterus. Neck supple.  Cardiovascular: Normal rate, regular rhythm.  Pulmonary/chest: Effort normal and breath sounds normal. No wheezing, rales or rhonchi. Abdominal: Soft, nondistended, nontender. There are no masses palpable. No hepatomegaly. Extremities: no edema Lymphadenopathy: No cervical adenopathy noted. Neurological: Alert and oriented to person place and time. Skin: Skin is warm and dry. No rashes noted. Psychiatric: Normal mood and affect. Behavior is normal.   ASSESSMENT AND PLAN: 62 year old female here for reassessment of the following issues:  History of hepatitis C - eradicated years ago, she's had multiple HCV RNAs negative between 2012-2015. No new risk factors for contracting this again. I reassured her I do not think she warrants any further hepatitis C testing, unless she thinks she's had a new exposure or risk factor for it, which she has not. She has had LFTs checked in August which were normal. She's not had CBC or INR in a while, will check that to ensure normal platelets and coagulation. Pending these are normal I reassured her she does not likely have any cirrhosis or liver damage from hepatitis C. Of  note, she has not had lipids drawn in a while, asking to have this done as she is fasting this AM.   GERD - well controlled with low dose omeprazole PRN, she does not take this daily, refilled this for her.  Constipation - continue Miralax, working well. Colonoscopy without any concerning findings in 2018.  Okarche Cellar, MD [facility]

## 2018-07-10 NOTE — Patient Instructions (Addendum)
If you are age 64 or older, your body mass index should be between 23-30. Your Body mass index is 24.27 kg/m. If this is out of the aforementioned range listed, please consider follow up with your Primary Care Provider.  If you are age 45 or younger, your body mass index should be between 19-25. Your Body mass index is 24.27 kg/m. If this is out of the aformentioned range listed, please consider follow up with your Primary Care Provider.   Please go to the lab in the basement of our building to have lab work done as you leave today. Hit "B" for basement when you get on the elevator.  When the doors open the lab is on your left.  We will call you with the results. Thank you.  We have sent the following medications to your pharmacy for you to pick up at your convenience: Omeprazole 20 mg: Take    Thank you for entrusting me with your care and for choosing [facility], Dr. [place] Cellar

## 2018-07-13 ENCOUNTER — Encounter: Payer: Self-pay | Admitting: Gastroenterology

## 2018-08-03 ENCOUNTER — Other Ambulatory Visit (HOSPITAL_COMMUNITY): Payer: Self-pay | Admitting: Internal Medicine

## 2018-08-03 DIAGNOSIS — Z853 Personal history of malignant neoplasm of breast: Secondary | ICD-10-CM

## 2018-08-11 ENCOUNTER — Ambulatory Visit (HOSPITAL_COMMUNITY): Payer: BLUE CROSS/BLUE SHIELD

## 2018-08-11 ENCOUNTER — Other Ambulatory Visit (HOSPITAL_COMMUNITY): Payer: BLUE CROSS/BLUE SHIELD

## 2018-08-11 ENCOUNTER — Ambulatory Visit (HOSPITAL_COMMUNITY)
Admission: RE | Admit: 2018-08-11 | Discharge: 2018-08-11 | Disposition: A | Discharge status: Home / Self Care | Payer: BLUE CROSS/BLUE SHIELD | Source: Ambulatory Visit | Attending: Internal Medicine | Admitting: Internal Medicine

## 2018-08-11 DIAGNOSIS — Z170 Estrogen receptor positive status [ER+]: Secondary | ICD-10-CM | POA: Insufficient documentation

## 2018-08-11 DIAGNOSIS — C50412 Malignant neoplasm of upper-outer quadrant of left female breast: Secondary | ICD-10-CM | POA: Diagnosis not present

## 2018-08-17 ENCOUNTER — Ambulatory Visit (HOSPITAL_COMMUNITY): Payer: BLUE CROSS/BLUE SHIELD | Admitting: Hematology

## 2018-09-10 ENCOUNTER — Other Ambulatory Visit (HOSPITAL_COMMUNITY): Payer: Self-pay | Admitting: *Deleted

## 2018-09-10 DIAGNOSIS — Z170 Estrogen receptor positive status [ER+]: Principal | ICD-10-CM

## 2018-09-10 DIAGNOSIS — C50412 Malignant neoplasm of upper-outer quadrant of left female breast: Secondary | ICD-10-CM

## 2018-09-11 ENCOUNTER — Inpatient Hospital Stay (HOSPITAL_COMMUNITY): Payer: BLUE CROSS/BLUE SHIELD | Attending: Hematology

## 2018-09-11 ENCOUNTER — Encounter (HOSPITAL_COMMUNITY): Payer: Self-pay | Admitting: Hematology

## 2018-09-11 ENCOUNTER — Inpatient Hospital Stay (HOSPITAL_COMMUNITY): Payer: BLUE CROSS/BLUE SHIELD | Admitting: Hematology

## 2018-09-11 ENCOUNTER — Other Ambulatory Visit: Payer: Self-pay

## 2018-09-11 ENCOUNTER — Inpatient Hospital Stay (HOSPITAL_COMMUNITY): Payer: BLUE CROSS/BLUE SHIELD

## 2018-09-11 VITALS — BP 119/68 | HR 73 | Temp 98.2°F | Resp 16 | Wt 142.0 lb

## 2018-09-11 DIAGNOSIS — Z170 Estrogen receptor positive status [ER+]: Secondary | ICD-10-CM

## 2018-09-11 DIAGNOSIS — Z780 Asymptomatic menopausal state: Secondary | ICD-10-CM

## 2018-09-11 DIAGNOSIS — Z923 Personal history of irradiation: Secondary | ICD-10-CM

## 2018-09-11 DIAGNOSIS — C50412 Malignant neoplasm of upper-outer quadrant of left female breast: Secondary | ICD-10-CM

## 2018-09-11 DIAGNOSIS — Z791 Long term (current) use of non-steroidal anti-inflammatories (NSAID): Secondary | ICD-10-CM

## 2018-09-11 DIAGNOSIS — M810 Age-related osteoporosis without current pathological fracture: Secondary | ICD-10-CM | POA: Insufficient documentation

## 2018-09-11 DIAGNOSIS — Z23 Encounter for immunization: Secondary | ICD-10-CM | POA: Diagnosis not present

## 2018-09-11 DIAGNOSIS — Z79899 Other long term (current) drug therapy: Secondary | ICD-10-CM

## 2018-09-11 LAB — COMPREHENSIVE METABOLIC PANEL
ALBUMIN: 4.2 g/dL (ref 3.5–5.0)
ALT: 17 U/L (ref 0–44)
ANION GAP: 9 (ref 5–15)
AST: 21 U/L (ref 15–41)
Alkaline Phosphatase: 49 U/L (ref 38–126)
BUN: 21 mg/dL (ref 8–23)
CO2: 29 mmol/L (ref 22–32)
Calcium: 9.2 mg/dL (ref 8.9–10.3)
Chloride: 101 mmol/L (ref 98–111)
Creatinine, Ser: 0.64 mg/dL (ref 0.44–1.00)
GFR calc non Af Amer: >60 mL/min (ref >60)
Glucose, Bld: 77 mg/dL (ref 70–99)
POTASSIUM: 4.3 mmol/L (ref 3.5–5.1)
Sodium: 139 mmol/L (ref 135–145)
Total Bilirubin: 0.8 mg/dL (ref 0.3–1.2)
Total Protein: 7.1 g/dL (ref 6.5–8.1)

## 2018-09-11 LAB — CBC WITH DIFFERENTIAL/PLATELET
Abs Immature Granulocytes: 0.01 10*3/uL (ref 0.00–0.07)
BASOS ABS: 0 10*3/uL (ref 0.0–0.1)
Basophils Relative: 1 %
EOS ABS: 0.2 10*3/uL (ref 0.0–0.5)
Eosinophils Relative: 3 %
HCT: 46.1 % — ABNORMAL HIGH (ref 36.0–46.0)
Hemoglobin: 13.9 g/dL (ref 12.0–15.0)
Immature Granulocytes: 0 %
LYMPHS ABS: 1.3 10*3/uL (ref 0.7–4.0)
Lymphocytes Relative: 22 %
MCH: 27.9 pg (ref 26.0–34.0)
MCHC: 30.2 g/dL (ref 30.0–36.0)
MCV: 92.4 fL (ref 80.0–100.0)
Monocytes Absolute: 0.4 10*3/uL (ref 0.1–1.0)
Monocytes Relative: 6 %
Neutro Abs: 4 10*3/uL (ref 1.7–7.7)
Neutrophils Relative %: 68 %
Platelets: 224 10*3/uL (ref 150–400)
RBC: 4.99 MIL/uL (ref 3.87–5.11)
RDW: 13.2 % (ref 11.5–15.5)
WBC: 5.8 10*3/uL (ref 4.0–10.5)
nRBC: 0 % (ref 0.0–0.2)

## 2018-09-11 LAB — LACTATE DEHYDROGENASE: LDH: 112 U/L (ref 98–192)

## 2018-09-11 MED ORDER — DENOSUMAB 60 MG/ML ~~LOC~~ SOSY
60.0000 mg | PREFILLED_SYRINGE | Freq: Once | SUBCUTANEOUS | Status: AC
  Administered 2018-09-11: 60 mg via SUBCUTANEOUS
  Filled 2018-09-11: qty 1

## 2018-09-11 MED ORDER — INFLUENZA VAC SPLIT QUAD 0.5 ML IM SUSY
0.5000 mL | PREFILLED_SYRINGE | Freq: Once | INTRAMUSCULAR | Status: AC
  Administered 2018-09-11: 0.5 mL via INTRAMUSCULAR
  Filled 2018-09-11: qty 0.5

## 2018-09-11 NOTE — Assessment & Plan Note (Addendum)
1.  DCIS of the left breast: -Biopsy on 07/07/2014, DCIS, ER/PR positive - Left lumpectomy on 08/05/2014, 2.2 cm DCIS, intermediate grade, 0/1 lymph node positive, PTis PN0.  ER/PR positive. - Underwent radiation therapy from 09/05/2014 through 10/04/2014. -Took tamoxifen for only 1 week and had developed arthralgias and myalgias and could not continue it. - Physical exam today did not reveal any suspicious masses or adenopathy. -I reviewed mammogram dated 08/11/2018 which was BI-RADS Category 2. -She will come back in 6 months for follow-up.  2.  Osteoporosis: -DEXA scan on 10/03/2017 shows T score of -3.9. -She was started on Prolia on 03/10/2018.  She will receive second injection today.  Prior to that she was on Fosamax and could not tolerate it secondary to GI side effects. -Her calcium was within normal limits.

## 2018-09-11 NOTE — Patient Instructions (Signed)
[facility] at [facility] Discharge Instructions     Thank you for choosing  [facility] at [facility] to provide your oncology and hematology care.  To afford each patient quality time with our provider, please arrive at least 15 minutes before your scheduled appointment time.   If you have a lab appointment with the Cancer Center please come in thru the  Main Entrance and check in at the main information desk  You need to re-schedule your appointment should you arrive 10 or more minutes late.  We strive to give you quality time with our providers, and arriving late affects you and other patients whose appointments are after yours.  Also, if you no show three or more times for appointments you may be dismissed from the clinic at the providers discretion.     Again, thank you for choosing [facility].  Our hope is that these requests will decrease the amount of time that you wait before being seen by our physicians.       _____________________________________________________________  Should you have questions after your visit to [facility], please contact our office at [phone] between the hours of 8:00 a.m. and 4:30 p.m.  Voicemails left after 4:00 p.m. will not be returned until the following business day.  For prescription refill requests, have your pharmacy contact our office and allow 72 hours.    Cancer Center Support Programs:   > Cancer Support Group  2nd Tuesday of the month 1pm-2pm, Journey Room    

## 2018-09-11 NOTE — Progress Notes (Signed)
Pt here today for Prolia and flu shot. Pt tolerated both injections well with no complaints. Pt stable and discharged home ambulatory. Return as scheduled

## 2018-09-11 NOTE — Progress Notes (Signed)
[place] [place], [place] [address]   CLINIC:  Medical Oncology/Hematology  PCP:  [name], [place] [address] [phone]   REASON FOR VISIT: Follow-up for Breast cancer of upper-outer quadrant of left female breast, ER+/PR+  PREVIOUS THERAPY: S/P left lumpectomy by Dr. Dalbert Batman on 08/05/2014 followed by XRT (09/05/2014- 10/04/2014) followed by anti-endocrine therapy consisting of Tamoxifen. Stopped tamoxifen after 1 weeks due to joint pain  CURRENT THERAPY: surveillance per NCCN guidelines   BRIEF ONCOLOGIC HISTORY:    Breast cancer of upper-outer quadrant of left female breast ([place])   07/05/2014 Breast US    Palpable left breast mass; U/S revealed irregular mass in UOQ of left breast with associated pleomorphic calcs. Mass and calcs span 1.5 cm.     07/07/2014 Initial Biopsy    Left breast needle core biopsy, 2 o'clock position revealed intermediate grade DCIS with calcs. ER+ (100%), PR+ (59%).     07/07/2014 Initial Diagnosis    Breast cancer of upper-outer quadrant of left female breast    07/12/2014 Breast MRI    UOQ of left breast with 2.0 x 1.4 x 1.3 cm biopsy-proven ductal carcinoma. No evidence of malignancy elsewhere in either breast. No adenopathy.     08/05/2014 Definitive Surgery    Left lumpectomy (Dr. Dalbert Batman) revealed intermediate grade DCIS, spanning 2.2 cm. Negative margins. 1 intramammary LN benign.     09/05/2014 - 10/04/2014 Radiation Therapy    Left breast: total radiation dose 42.72 Gy over 21 fractions.  Left breast boost: total radiation dose 10 Gy over 5 fractions.     09/22/2014 - 10/21/2014 Anti-estrogen oral therapy    Tamoxifen started by Dr. Whitney Muse.  Planned duration of treatment: 5 years.     10/16/2014 Adverse Reaction    Did not feel well. Worsening pain/joint pain    10/28/2014 Survivorship    Survivorship Care Plan given and reviewed with patient in-person.       CANCER  STAGING: Cancer Staging Breast cancer of upper-outer quadrant of left female breast [facility]) Staging form: Breast, AJCC 7th Edition - Clinical stage from 07/13/2014: Stage 0 (Tis (DCIS), N0, M0) - Unsigned - Pathologic stage from 08/08/2014: Stage Unknown (Tis (DCIS), NX, cM0) - Signed by Seward Grater, MD on 08/15/2014    INTERVAL HISTORY:  Ms. Finnell 63 y.o. female returns for routine follow-up for left breast cancer. She is doing well since her last visit. She has mild joint pains she experiences daily. Denies any nausea, vomiting, or diarrhea. Denies any new pains. Had not noticed any recent bleeding such as epistaxis, hematuria or hematochezia. Denies recent chest pain on exertion, shortness of breath on minimal exertion, pre-syncopal episodes, or palpitations. Denies any numbness or tingling in hands or feet. Denies any recent fevers, infections, or recent hospitalizations. Patient reports appetite at 100% and energy level at 100%. She eats well and maintains her weight well.    REVIEW OF SYSTEMS:  Review of Systems  All other systems reviewed and are negative.    PAST MEDICAL/SURGICAL HISTORY:  Past Medical History:  Diagnosis Date  . 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 Arthritis   . Axillary abscess    left  . Barrett's esophagus   . Blood transfusion without reported diagnosis    1989  . Breast cancer of upper-outer quadrant of left female breast ([place]) 07/11/2014  . Cataract    both eyes in 2016  . Cellulitis and  abscess of unspecified site   . Diverticulosis   . Esophageal stricture   . GERD (gastroesophageal reflux disease)   . Heart murmur   . Hepatitis C 1990   interferon treatment  . Hiatal hernia   . Hyperlipidemia   . IBS (irritable bowel syndrome)   . Internal hemorrhoids   . Mitral valve prolapse   . Neuropathy   . Osteoporosis   . Other specified disease of hair and hair follicles   . Personal history of radiation therapy 2016  . PONV  (postoperative nausea and vomiting)   . Postmenopausal 02/24/2018  . 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 Reflex sympathetic dystrophy   . Rosacea   . Scoliosis    adhesive arachnoditis-right side   Past Surgical History:  Procedure Laterality Date  . [place], 2006   due to scoliosis= total 5 back surgeries  . BREAST FIBROADENOMA SURGERY     bilateral  . BREAST LUMPECTOMY Left   . BREAST LUMPECTOMY WITH RADIOACTIVE SEED LOCALIZATION Left 08/05/2014   Procedure: LEFT PARTICAL MASTECTOMY WITH RADIOACTIVE SEED LOCALIZATION;  Surgeon: Fanny Skates, MD;  Location: [facility];  Service: General;  Laterality: Left;  . [place], 1980, 1984  . COLONOSCOPY  2009  . HYSTEROSCOPY    . INCISE AND DRAIN ABCESS     left axillary abscess  . SPINAL FUSION    . spinal surgeries    . TONSILLECTOMY    . UPPER GASTROINTESTINAL ENDOSCOPY       SOCIAL HISTORY:  Social History   Socioeconomic History  . Marital status: Married    Spouse name: Not on file  . Number of children: Not on file  . Years of education: Not on file  . Highest education level: Not on file  Occupational History  . Not on file  Social Needs  . Financial resource strain: Not on file  . Food insecurity:    Worry: Not on file    Inability: Not on file  . Transportation needs:    Medical: Not on file    Non-medical: Not on file  Tobacco Use  . Smoking status: Never Smoker  . Smokeless tobacco: Never Used  Substance and Sexual Activity  . Alcohol use: Yes    Alcohol/week: 0.0 standard drinks    Comment: 1 glass wine a month  . Drug use: No  . Sexual activity: Not on file  Lifestyle  . Physical activity:    Days per week: Not on file    Minutes per session: Not on file  . Stress: Not on file  Relationships  . Social connections:    Talks on phone: Not on file    Gets together: Not on file    Attends religious service: Not on file    Active member  of club or organization: Not on file    Attends meetings of clubs or organizations: Not on file    Relationship status: Not on file  . Intimate partner violence:    Fear of current or ex partner: Not on file    Emotionally abused: Not on file    Physically abused: Not on file    Forced sexual activity: Not on file  Other Topics Concern  . Not on file  Social History Narrative  . Not on file    FAMILY HISTORY:  Family History  Problem Relation Age of Onset  . Heart disease Mother   .  Brain cancer Father        Glioblastoma  . Melanoma Paternal Grandmother   . Breast cancer Maternal Aunt        in her 23s  . Lung cancer Maternal Aunt   . Colon cancer Neg Hx   . Stomach cancer Neg Hx   . Rectal cancer Neg Hx   . Esophageal cancer Neg Hx     CURRENT MEDICATIONS:  Outpatient Encounter Medications as of 09/11/2018  Medication Sig  . Ascorbic Acid (VITA-C PO) Take by mouth.  . Calcium Carb-Cholecalciferol (CALCIUM 1000 + D) 1000-800 MG-UNIT TABS Take by mouth.  . cholecalciferol (VITAMIN D) 1000 UNITS tablet Take 1,000 Units by mouth daily. Reported on 07/12/2015  . diphenhydrAMINE (BENADRYL) 25 mg capsule Take 25 mg by mouth at bedtime as needed for itching or sleep.   Marland Kitchen gabapentin (NEURONTIN) 300 MG capsule Take 300 mg by mouth 3 (three) times daily.  . hydrocortisone (ANUSOL-HC) 25 MG suppository Place 1 suppository (25 mg total) rectally 2 (two) times daily.  Marland Kitchen HYDROmorphone (DILAUDID) 4 MG tablet Take 2-4 mg by mouth every 6 (six) hours as needed for moderate pain or severe pain.   Marland Kitchen ibuprofen (ADVIL,MOTRIN) 200 MG tablet Take 400 mg by mouth every 4 (four) hours as needed for mild pain or moderate pain.  . Multiple Vitamin (MULTIVITAMIN) tablet Take 1 tablet by mouth daily. Reported on 07/12/2015  . omeprazole (PRILOSEC) 20 MG capsule Take 1 capsule (20 mg total) by mouth daily as needed.  . Zinc 100 MG TABS Take by mouth.  . [DISCONTINUED] Calcium Carbonate-Vit D-Min  (CALCIUM 1200 PO) Take 1,200 mg by mouth daily.   . [DISCONTINUED] denosumab (PROLIA) 60 MG/ML SOSY injection Inject 60 mg into the skin every 6 (six) months.  . [EXPIRED] denosumab (PROLIA) injection 60 mg   . [EXPIRED] Influenza vac split quadrivalent PF (FLUARIX) injection 0.5 mL    No facility-administered encounter medications on file as of 09/11/2018.     ALLERGIES:  Allergies  Allergen Reactions  . Acetaminophen Other (See Comments)    Liver sensitivity secondary to h/o hep. c     PHYSICAL EXAM:  ECOG Performance status: 1  Vitals:   09/11/18 1107  BP: 119/68  Pulse: 73  Resp: 16  Temp: 98.2 F (36.8 C)  SpO2: 98%   Filed Weights   09/11/18 1107  Weight: 142 lb (64.4 kg)    Physical Exam Constitutional:      Appearance: Normal appearance. She is normal weight.  Cardiovascular:     Rate and Rhythm: Normal rate and regular rhythm.     Heart sounds: Normal heart sounds.  Pulmonary:     Effort: Pulmonary effort is normal.     Breath sounds: Normal breath sounds.  Abdominal:     General: Abdomen is flat.     Palpations: Abdomen is soft.  Musculoskeletal: Normal range of motion.  Skin:    General: Skin is warm and dry.  Neurological:     Mental Status: She is alert and oriented to person, place, and time. Mental status is at baseline.  Psychiatric:        Mood and Affect: Mood normal.        Behavior: Behavior normal.        Thought Content: Thought content normal.        Judgment: Judgment normal.   Breast: No palpable masses, no skin changes or nipple discharge, no adenopathy.   LABORATORY DATA:  I have reviewed  the labs as listed.  CBC    Component Value Date/Time   WBC 5.8 09/11/2018 1000   RBC 4.99 09/11/2018 1000   HGB 13.9 09/11/2018 1000   HGB 14.7 07/13/2014 1220   HCT 46.1 (H) 09/11/2018 1000   HCT 45.7 07/13/2014 1220   PLT 224 09/11/2018 1000   PLT 244 07/13/2014 1220   MCV 92.4 09/11/2018 1000   MCV 87.9 07/13/2014 1220   MCH  27.9 09/11/2018 1000   MCHC 30.2 09/11/2018 1000   RDW 13.2 09/11/2018 1000   RDW 13.5 07/13/2014 1220   LYMPHSABS 1.3 09/11/2018 1000   LYMPHSABS 1.3 07/13/2014 1220   MONOABS 0.4 09/11/2018 1000   MONOABS 0.5 07/13/2014 1220   EOSABS 0.2 09/11/2018 1000   EOSABS 0.1 07/13/2014 1220   BASOSABS 0.0 09/11/2018 1000   BASOSABS 0.1 07/13/2014 1220   CMP Latest Ref Rng & Units 09/11/2018 03/10/2018 07/19/2016  Glucose 70 - 99 mg/dL 77 98 94  BUN 8 - 23 mg/dL 21 14 14   Creatinine 0.44 - 1.00 mg/dL 0.64 0.52 0.57  Sodium 135 - 145 mmol/L 139 140 139  Potassium 3.5 - 5.1 mmol/L 4.3 4.6 4.0  Chloride 98 - 111 mmol/L 101 103 105  CO2 22 - 32 mmol/L 29 32 28  Calcium 8.9 - 10.3 mg/dL 9.2 9.4 9.5  Total Protein 6.5 - 8.1 g/dL 7.1 6.8 7.1  Total Bilirubin 0.3 - 1.2 mg/dL 0.8 0.8 0.7  Alkaline Phos 38 - 126 U/L 49 60 58  AST 15 - 41 U/L 21 18 22   ALT 0 - 44 U/L 17 17 18        DIAGNOSTIC IMAGING:  I have independently reviewed the scans and discussed with the patient.   I have reviewed Francene Finders, NP's note and agree with the documentation.  I personally performed a face-to-face visit, made revisions and my assessment and plan is as follows.    ASSESSMENT & PLAN:   Breast cancer of upper-outer quadrant of left female breast ([place]) 1.  DCIS of the left breast: -Biopsy on 07/07/2014, DCIS, ER/PR positive - Left lumpectomy on 08/05/2014, 2.2 cm DCIS, intermediate grade, 0/1 lymph node positive, PTis PN0.  ER/PR positive. - Underwent radiation therapy from 09/05/2014 through 10/04/2014. -Took tamoxifen for only 1 week and had developed arthralgias and myalgias and could not continue it. - Physical exam today did not reveal any suspicious masses or adenopathy. -I reviewed mammogram dated 08/11/2018 which was BI-RADS Category 2. -She will come back in 6 months for follow-up.  2.  Osteoporosis: -DEXA scan on 10/03/2017 shows T score of -3.9. -She was started on Prolia on 03/10/2018.  She will  receive second injection today.  Prior to that she was on Fosamax and could not tolerate it secondary to GI side effects. -Her calcium was within normal limits.      Orders placed this encounter:  Orders Placed This Encounter  Procedures  . Lactate dehydrogenase  . CBC with Differential/Platelet  . Comprehensive metabolic panel      Derek Jack, MD [place] [phone]

## 2018-09-15 ENCOUNTER — Ambulatory Visit (INDEPENDENT_AMBULATORY_CARE_PROVIDER_SITE_OTHER): Payer: BLUE CROSS/BLUE SHIELD | Admitting: Obstetrics & Gynecology

## 2018-09-15 ENCOUNTER — Encounter: Payer: Self-pay | Admitting: Obstetrics & Gynecology

## 2018-09-15 VITALS — BP 124/80 | Ht 65.0 in | Wt 144.0 lb

## 2018-09-15 DIAGNOSIS — Z170 Estrogen receptor positive status [ER+]: Secondary | ICD-10-CM

## 2018-09-15 DIAGNOSIS — M810 Age-related osteoporosis without current pathological fracture: Secondary | ICD-10-CM

## 2018-09-15 DIAGNOSIS — Z780 Asymptomatic menopausal state: Secondary | ICD-10-CM

## 2018-09-15 DIAGNOSIS — C50412 Malignant neoplasm of upper-outer quadrant of left female breast: Secondary | ICD-10-CM | POA: Diagnosis not present

## 2018-09-15 DIAGNOSIS — Z01419 Encounter for gynecological examination (general) (routine) without abnormal findings: Secondary | ICD-10-CM

## 2018-09-15 NOTE — Progress Notes (Signed)
Amanda Terrell Dec 05, 1955 [id number]   History:    63 y.o. [id number] Married  RP:  Established patient presenting for annual gyn exam   HPI: Menopause, well on no HRT.  No PMB.  Left ductal in-Situ Breast Cancer.  ER positive.  Left Lumpectomy 07/2014 and Radiation therapy.  Mammo Benign 08/11/2018.  Scoliosis.  Chronic pain syndrome.  Neuropathy.  BMI 23.96.  Water exercises/stationary bike/weight lifting.  Health labs with Fam MD.  Past medical history,surgical history, family history and social history were all reviewed and documented in the EPIC chart.  Gynecologic History No LMP recorded. Patient is postmenopausal. Contraception: post menopausal status Last Pap: 05/2016. Results were: Negative/HPV HR neg Last mammogram: 08/11/2018. Results were: Benign Bone Density: 09/2017 Osteoporosis Fosamax Colonoscopy: 2018  Obstetric History OB History  Gravida Para Term Preterm AB Living  4 3       3   SAB TAB Ectopic Multiple Live Births               # Outcome Date GA Lbr Len/2nd Weight Sex Delivery Anes PTL Lv  4 Gravida           3 Para           2 Para           1 Para              ROS: A ROS was performed and pertinent positives and negatives are included in the history.  GENERAL: No fevers or chills. HEENT: No change in vision, no earache, sore throat or sinus congestion. NECK: No pain or stiffness. CARDIOVASCULAR: No chest pain or pressure. No palpitations. PULMONARY: No shortness of breath, cough or wheeze. GASTROINTESTINAL: No abdominal pain, nausea, vomiting or diarrhea, melena or bright red blood per rectum. GENITOURINARY: No urinary frequency, urgency, hesitancy or dysuria. MUSCULOSKELETAL: No joint or muscle pain, no back pain, no recent trauma. DERMATOLOGIC: No rash, no itching, no lesions. ENDOCRINE: No polyuria, polydipsia, no heat or cold intolerance. No recent change in weight. HEMATOLOGICAL: No anemia or easy bruising or bleeding. NEUROLOGIC: No headache, seizures,  numbness, tingling or weakness. PSYCHIATRIC: No depression, no loss of interest in normal activity or change in sleep pattern.     Exam:   BP 124/80   Ht 5\' 5"  (1.651 m)   Wt 144 lb (65.3 kg)   BMI 23.96 kg/m   Body mass index is 23.96 kg/m.  General appearance : Well developed well nourished female. No acute distress HEENT: Eyes: no retinal hemorrhage or exudates,  Neck supple, trachea midline, no carotid bruits, no thyroidmegaly Lungs: Clear to auscultation, no rhonchi or wheezes, or rib retractions  Heart: Regular rate and rhythm, no murmurs or gallops Breast:Examined in sitting and supine position were symmetrical in appearance, no palpable masses or tenderness,  no skin retraction, no nipple inversion, no nipple discharge, no skin discoloration, no axillary or supraclavicular lymphadenopathy Abdomen: no palpable masses or tenderness, no rebound or guarding Extremities: no edema or skin discoloration or tenderness  Pelvic: Vulva: Normal             Vagina: No gross lesions or discharge  Cervix: No gross lesions or discharge.  Pap reflex done.  Uterus  AV, normal size, shape and consistency, non-tender and mobile  Adnexa  Without masses or tenderness  Anus: Normal   Assessment/Plan:  63 y.o. female for annual exam   1. Encounter for routine gynecological examination with Papanicolaou smear of cervix Normal gynecologic exam  in menopause.  Pap reflex done.  Breast exam normal.  Screening mammogram January 2020 was benign.  Colonoscopy in 2018.  Health labs with family physician.  Body mass index 23.96.  Continue with fitness and healthy nutrition.  2. Postmenopausal Well on no hormone replacement therapy.  No postmenopausal bleeding.    3. Age-related osteoporosis without current pathological fracture Last bone density March 2019 showed osteoporosis.  Patient on Fosamax.  Vitamin D supplements and calcium intake of 1.2 to 1.5 g/day including nutritional and supplemental,  weightbearing physical activity on a regular basis.  4. Malignant neoplasm of upper-outer quadrant of left breast in female, estrogen receptor positive ([place]) Followed by oncologist.  Princess Bruins MD, 2:48 PM 09/15/2018

## 2018-09-15 NOTE — Patient Instructions (Signed)
  1. Encounter for routine gynecological examination with Papanicolaou smear of cervix Normal gynecologic exam in menopause.  Pap reflex done.  Breast exam normal.  Screening mammogram January 2020 was benign.  Colonoscopy in 2018.  Health labs with family physician.  Body mass index 23.96.  Continue with fitness and healthy nutrition.  2. Postmenopausal Well on no hormone replacement therapy.  No postmenopausal bleeding.    3. Age-related osteoporosis without current pathological fracture Last bone density March 2019 showed osteoporosis.  Patient on Fosamax.  Vitamin D supplements and calcium intake of 1.2 to 1.5 g/day including nutritional and supplemental, weightbearing physical activity on a regular basis.  4. Malignant neoplasm of upper-outer quadrant of left breast in female, estrogen receptor positive ([place]) Followed by oncologist.  Caren Griffins, it was a pleasure seeing you today!  I will inform you of your results as soon as they are available.

## 2018-09-16 LAB — PAP IG W/ RFLX HPV ASCU

## 2018-11-02 ENCOUNTER — Telehealth (HOSPITAL_COMMUNITY): Payer: Self-pay | Admitting: Internal Medicine

## 2018-11-02 NOTE — Telephone Encounter (Signed)
11/02/18  I spoke with patient and we cx her 4/21 appt and told her that we would call to rescehdule once the office reopened

## 2018-11-17 ENCOUNTER — Encounter (HOSPITAL_COMMUNITY): Payer: Self-pay

## 2018-11-17 ENCOUNTER — Ambulatory Visit (HOSPITAL_COMMUNITY): Payer: BLUE CROSS/BLUE SHIELD | Admitting: Physical Therapy

## 2019-01-19 ENCOUNTER — Ambulatory Visit (HOSPITAL_COMMUNITY): Payer: BC Managed Care – PPO | Attending: Neurosurgery

## 2019-01-19 ENCOUNTER — Encounter (HOSPITAL_COMMUNITY): Payer: Self-pay

## 2019-01-19 ENCOUNTER — Other Ambulatory Visit: Payer: Self-pay

## 2019-01-19 DIAGNOSIS — R29818 Other symptoms and signs involving the nervous system: Secondary | ICD-10-CM | POA: Insufficient documentation

## 2019-01-19 DIAGNOSIS — R262 Difficulty in walking, not elsewhere classified: Secondary | ICD-10-CM | POA: Insufficient documentation

## 2019-01-19 DIAGNOSIS — M6281 Muscle weakness (generalized): Secondary | ICD-10-CM | POA: Insufficient documentation

## 2019-01-19 DIAGNOSIS — R29898 Other symptoms and signs involving the musculoskeletal system: Secondary | ICD-10-CM | POA: Diagnosis present

## 2019-01-19 NOTE — Therapy (Signed)
[place], [place], [address] Phone: [phone]   Fax:  [phone]  Physical Therapy Evaluation  Patient Details  Name: Amanda Terrell MRN: [id number] Date of Birth: August 02, 1955 Referring Provider (PT): Glenna Fellows, MD   Encounter Date: 01/19/2019  PT End of Session - 01/19/19 0956    Visit Number  1    Number of Visits  12    Date for PT Re-Evaluation  03/02/19   mini reassess on 02/09/19   Authorization Type  BCBS Other    Authorization Time Period  01/19/19 - 03/02/19    Authorization - Visit Number  1    Authorization - Number of Visits  30   30 visit max PT/OT combined   PT Start Time  0913    PT Stop Time  0956    PT Time Calculation (min)  43 min    Equipment Utilized During Treatment  Gait belt;Other (comment)   R AFO   Activity Tolerance  Patient tolerated treatment well    Behavior During Therapy  WFL for tasks assessed/performed       Past Medical History:  Diagnosis Date  . 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 Arthritis   . Axillary abscess    left  . Barrett's esophagus   . Blood transfusion without reported diagnosis    1989  . Breast cancer of upper-outer quadrant of left female breast ([place]) 07/11/2014  . Cataract    both eyes in 2016  . Cellulitis and abscess of unspecified site   . Diverticulosis   . Esophageal stricture   . GERD (gastroesophageal reflux disease)   . Heart murmur   . Hepatitis C 1990   interferon treatment  . Hiatal hernia   . Hyperlipidemia   . IBS (irritable bowel syndrome)   . Internal hemorrhoids   . Mitral valve prolapse   . Neuropathy   . Osteoporosis   . Other specified disease of hair and hair follicles   . Personal history of radiation therapy 2016  . PONV (postoperative nausea and vomiting)   . Postmenopausal 02/24/2018  . 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 Reflex sympathetic dystrophy   . Rosacea   . Scoliosis    adhesive  arachnoditis-right side    Past Surgical History:  Procedure Laterality Date  . [place], 2006   due to scoliosis= total 5 back surgeries  . BREAST FIBROADENOMA SURGERY     bilateral  . BREAST LUMPECTOMY Left   . BREAST LUMPECTOMY WITH RADIOACTIVE SEED LOCALIZATION Left 08/05/2014   Procedure: LEFT PARTICAL MASTECTOMY WITH RADIOACTIVE SEED LOCALIZATION;  Surgeon: Fanny Skates, MD;  Location: [facility];  Service: General;  Laterality: Left;  . [place], 1980, 1984  . COLONOSCOPY  2009  . HYSTEROSCOPY    . INCISE AND DRAIN ABCESS     left axillary abscess  . SPINAL FUSION    . spinal surgeries    . TONSILLECTOMY    . UPPER GASTROINTESTINAL ENDOSCOPY      There were no vitals filed for this visit.   Subjective Assessment - 01/19/19 0918    Subjective  Pt reports that she had back surgery in July 2006. She states that when they went to remove a screw around L5-S1, it caused a spinal fluid leak and she has had RLE weakness ever since. She has to use bil SPC (uses like  walking sticks) to ambulate due to weakness. She states that BLE are getting weaker but the R is worse than the L. She states that she can't feel her R foot. Strength and balance are her main concern. She has not had any falls in the last 6 months.    Limitations  Walking;Standing    How long can you sit comfortably?  limited a little due to pain    How long can you stand comfortably?  has to lean due to BLE weakness; <1 min if no UE support    How long can you walk comfortably?  <1 mile if she had to    Patient Stated Goals  improve strength and balnace    Currently in Pain?  Yes    Pain Score  7     Pain Location  Leg    Pain Orientation  Right;Lower    Pain Descriptors / Indicators  Sharp   "shocking, intense"   Pain Type  Chronic pain    Pain Radiating Towards  can radiate up or down her leg    Pain Onset  More than a month ago    Pain Frequency  Constant     Aggravating Factors   sitting, not taking Gabapentin    Pain Relieving Factors  Gabapentin, Motrin    Effect of Pain on Daily Activities  increase         [facility] PT Assessment - 01/19/19 0001      Assessment   Medical Diagnosis  R hip pain, failed back syndrome, reflex sypathetic dystrophy (RSD); balance and gait training    Referring Provider (PT)  Glenna Fellows, MD    Onset Date/Surgical Date  --   surgery in July 2006 which caused the RLE weakness   Next MD Visit  unsure    Prior Therapy  yes years ago      Balance Screen   Has the patient fallen in the past 6 months  No    Has the patient had a decrease in activity level because of a fear of falling?   No    Is the patient reluctant to leave their home because of a fear of falling?   No      Prior Function   Level of Independence  Independent with community mobility with device;Independent with basic ADLs    Vocation  Full time employment;Works at home    [employer]  helps with her [place]  outdoor activities, travel, spend time with family      Cognition   Overall Cognitive Status  Within Functional Limits for tasks assessed      Observation/Other Assessments   Focus on Therapeutic Outcomes (FOTO)   to be completed next visit      Functional Tests   Functional tests  Sit to Stand      Sit to Stand   Comments  30sec STS: 11 without UE support, max valgus moment      ROM / Strength   AROM / PROM / Strength  AROM;Strength      Strength   Strength Assessment Site  Hip;Knee;Ankle    Right Hip Flexion  4+/5    Right Hip Extension  2+/5    Right Hip ABduction  3-/5    Left Hip Flexion  4+/5    Left Hip Extension  2+/5    Left Hip ABduction  4-/5    Right Knee Flexion  3/5    Right  Knee Extension  5/5    Left Knee Flexion  3+/5    Left Knee Extension  5/5    Right Ankle Dorsiflexion  0/5    Left Ankle Dorsiflexion  4+/5      Ambulation/Gait   Gait Comments  3MWT to be completed next  visit      Balance   Balance Assessed  Yes      Static Standing Balance   Static Standing - Balance Support  No upper extremity supported    Static Standing Balance -  Activities   Single Leg Stance - Right Leg;Single Leg Stance - Left Leg    Static Standing - Comment/# of Minutes  R: 5sec or< with single UE L: 6sec or <      Standardized Balance Assessment   Standardized Balance Assessment  Dynamic Gait Index      Dynamic Gait Index   Level Surface  Moderate Impairment    Change in Gait Speed  Moderate Impairment    Gait with Horizontal Head Turns  Mild Impairment    Gait with Vertical Head Turns  Mild Impairment    Gait and Pivot Turn  Mild Impairment    Step Over Obstacle  Moderate Impairment    Step Around Obstacles  Mild Impairment    Steps  Mild Impairment    Total Score  13    DGI comment:  completed with bil SPC            Objective measurements completed on examination: See above findings.          PT Education - 01/19/19 0958    Education Details  Exam findings, POC, exercise technique, initiated HEP    Person(s) Educated  Patient    Methods  Explanation;Demonstration;Handout    Comprehension  Verbalized understanding       PT Short Term Goals - 01/19/19 1228      PT SHORT TERM GOAL #1   Title  Pt will have improved MMT by 1/2 grade throughout proximal hip mm in order to maximize gait and balance.    Time  3    Period  Weeks    Status  New    Target Date  02/09/19      PT SHORT TERM GOAL #2   Title  Pt will have improved 30sec chair rise test to 13x to demo improved functional strength and balance.    Time  3    Period  Weeks    Status  New      PT SHORT TERM GOAL #3   Title  Pt will have improved DGI score to 16/24 to demo improved dynamic balance with gait and reduce her risk for falling.    Time  3    Period  Weeks    Status  New        PT Long Term Goals - 01/19/19 1230      PT LONG TERM GOAL #1   Title  Pt will have improved MMT  by 1 grade throughout proximal hip mm in order to further maximize gait and balance.    Time  6    Period  Weeks    Status  New    Target Date  03/02/19      PT LONG TERM GOAL #2   Title  Pt will have improved L SLS to 10sec without UE and improved R SLS to 3sec without UE in order to demo improved functional hip and core strength and  to maximize her gait.    Time  6    Period  Weeks    Status  New      PT LONG TERM GOAL #3   Title  Pt will have improved 3MWT by 168ft with LRAD in order to demo improved functional tolerance to mobility and maximize her community access.    Time  6    Period  Weeks    Status  New      PT LONG TERM GOAL #4   Title  Pt will report being able to stand for 10 mins without UE support to demo improved BLE strength, endurance, and balance in order to allow her to stand and perform self-hygiene and prepare a small meal in the kitchen with greater ease.    Time  6    Period  Weeks    Status  New             Plan - 01/19/19 1226    Clinical Impression Statement  Pt is pleasant 34UY F who presents to therapy with reports of weakness and balance deficits. Pt has extensive h/o back surgeries (per pt and chart review) and pt reporting in 2006 she had CSF leak when her doctors removed some hardware in her lumbar spine. Pt has had increased RLE weakness and difficulty with mobility ever since. She currently ambulates with R AFO and bil SPC or rollator for community ambulation, has significant proximal hip strength deficits, max Trendelenberg sign BLE, as well as deficits in balance, gait, and dynamic balance. Pt scored 13/24 on the DGI indicating she is at increased risk for falling during gait and she could only complete 11STS during 30sec chair rise test, which is below her healthy aged-matched peers. Pt needs skilled PT in order to maximize her strength and balance to promote improved overall QOL and functional mobility.    Personal Factors and Comorbidities   Age;Comorbidity 3+;Past/Current Experience;Time since onset of injury/illness/exacerbation    Comorbidities  see above    Examination-Activity Limitations  Locomotion Level;Stand    Examination-Participation Restrictions  Community Activity;Yard Work    Stability/Clinical Decision Making  Stable/Uncomplicated    Designer, jewellery  Low    Rehab Potential  Good    PT Frequency  2x / week    PT Duration  6 weeks    PT Treatment/Interventions  ADLs/Self Care Home Management;Aquatic Therapy;Cryotherapy;Electrical Stimulation;Moist Heat;DME Instruction;Gait training;Stair training;Functional mobility training;Therapeutic activities;Therapeutic exercise;Balance training;Neuromuscular re-education;Patient/family education;Orthotic Fit/Training;Manual techniques;Wheelchair mobility training;Passive range of motion;Scar mobilization;Dry needling;Energy conservation;Taping    PT Next Visit Plan  review goals, administer FOTO; begin BLE, functional strength training, endurance training, balance training    PT Home Exercise Plan  eval: bridging, sidelying clams, STS    Consulted and Agree with Plan of Care  Patient       Patient will benefit from skilled therapeutic intervention in order to improve the following deficits and impairments:  Abnormal gait, Decreased activity tolerance, Decreased balance, Decreased endurance, Decreased mobility, Decreased strength, Difficulty walking, Hypomobility, Increased fascial restricitons, Increased muscle spasms, Impaired flexibility, Improper body mechanics, Pain  Visit Diagnosis: 1. Muscle weakness (generalized)   2. Difficulty in walking, not elsewhere classified   3. Other symptoms and signs involving the musculoskeletal system   4. Other symptoms and signs involving the nervous system        Problem List Patient Active Problem List   Diagnosis Date Noted  . Postmenopausal 02/24/2018  . Pain syndrome, chronic 08/06/2015  . Lower extremity  pain,  left 04/12/2015  . Osteoporosis 11/21/2014  . Breast cancer of upper-outer quadrant of left female breast ([place]) 07/11/2014  . Abscess of axilla, left 06/27/2011  . HYPERLIPIDEMIA 01/26/2008  . REFLEX SYMPATHETIC DYSTROPHY 01/26/2008  . NEUROPATHY 01/26/2008  . HEMORRHOIDS, INTERNAL 01/26/2008  . ESOPHAGEAL STRICTURE 01/26/2008  . GERD 01/26/2008  . BARRETTS ESOPHAGUS 01/26/2008  . DIVERTICULOSIS, COLON 01/26/2008  . IRRITABLE BOWEL SYNDROME 01/26/2008  . SCOLIOSIS 01/26/2008  . HEPATITIS C, HX OF 01/26/2008  . MITRAL VALVE PROLAPSE, HX OF 01/26/2008       Geraldine Solar PT, DPT   [facility] [address] [place], [place], [address] Phone: [phone]   Fax:  [phone]  Name: Amanda Terrell MRN: [id number] Date of Birth: 1955-09-08

## 2019-01-21 ENCOUNTER — Encounter (HOSPITAL_COMMUNITY): Payer: Self-pay

## 2019-01-21 ENCOUNTER — Other Ambulatory Visit: Payer: Self-pay

## 2019-01-21 ENCOUNTER — Ambulatory Visit (HOSPITAL_COMMUNITY): Payer: BC Managed Care – PPO

## 2019-01-21 DIAGNOSIS — R262 Difficulty in walking, not elsewhere classified: Secondary | ICD-10-CM

## 2019-01-21 DIAGNOSIS — M6281 Muscle weakness (generalized): Secondary | ICD-10-CM | POA: Diagnosis not present

## 2019-01-21 DIAGNOSIS — R29898 Other symptoms and signs involving the musculoskeletal system: Secondary | ICD-10-CM

## 2019-01-21 DIAGNOSIS — R29818 Other symptoms and signs involving the nervous system: Secondary | ICD-10-CM

## 2019-01-21 NOTE — Therapy (Signed)
[name], [place], [address] Phone: [phone]   Fax:  [phone]  Physical Therapy Treatment  Patient Details  Name: Amanda Terrell MRN: [id number] Date of Birth: 09-29-1955 Referring Provider (PT): Glenna Fellows, MD   Encounter Date: 01/21/2019  PT End of Session - 01/21/19 1113    Visit Number  2    Number of Visits  12    Date for PT Re-Evaluation  03/02/19   mini reassess on 02/09/19   Authorization Type  BCBS Other    Authorization Time Period  01/19/19 - 03/02/19    Authorization - Visit Number  2    Authorization - Number of Visits  30   30 visit max PT/OT combined   PT Start Time  1115    PT Stop Time  1200    PT Time Calculation (min)  45 min    Equipment Utilized During Treatment  Gait belt;Other (comment)   R AFO   Activity Tolerance  Patient tolerated treatment well    Behavior During Therapy  WFL for tasks assessed/performed       Past Medical History:  Diagnosis Date  . 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 Arthritis   . Axillary abscess    left  . Barrett's esophagus   . Blood transfusion without reported diagnosis    1989  . Breast cancer of upper-outer quadrant of left female breast ([place]) 07/11/2014  . Cataract    both eyes in 2016  . Cellulitis and abscess of unspecified site   . Diverticulosis   . Esophageal stricture   . GERD (gastroesophageal reflux disease)   . Heart murmur   . Hepatitis C 1990   interferon treatment  . Hiatal hernia   . Hyperlipidemia   . IBS (irritable bowel syndrome)   . Internal hemorrhoids   . Mitral valve prolapse   . Neuropathy   . Osteoporosis   . Other specified disease of hair and hair follicles   . Personal history of radiation therapy 2016  . PONV (postoperative nausea and vomiting)   . Postmenopausal 02/24/2018  . 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 Reflex sympathetic dystrophy   . Rosacea   . Scoliosis    adhesive  arachnoditis-right side    Past Surgical History:  Procedure Laterality Date  . [place], 2006   due to scoliosis= total 5 back surgeries  . BREAST FIBROADENOMA SURGERY     bilateral  . BREAST LUMPECTOMY Left   . BREAST LUMPECTOMY WITH RADIOACTIVE SEED LOCALIZATION Left 08/05/2014   Procedure: LEFT PARTICAL MASTECTOMY WITH RADIOACTIVE SEED LOCALIZATION;  Surgeon: Fanny Skates, MD;  Location: [facility];  Service: General;  Laterality: Left;  . [place], 1980, 1984  . COLONOSCOPY  2009  . HYSTEROSCOPY    . INCISE AND DRAIN ABCESS     left axillary abscess  . SPINAL FUSION    . spinal surgeries    . TONSILLECTOMY    . UPPER GASTROINTESTINAL ENDOSCOPY      There were no vitals filed for this visit.  Subjective Assessment - 01/21/19 1118    Subjective  Pt states that she is a little sore from her eval. She states that she was compliant with her HEP and thinks that's what made her sore.    Limitations  Walking;Standing    How long can you sit comfortably?  limited a  little due to pain    How long can you stand comfortably?  has to lean due to BLE weakness; <1 min if no UE support    How long can you walk comfortably?  <1 mile if she had to    Patient Stated Goals  improve strength and balnace    Currently in Pain?  Yes    Pain Score  6     Pain Location  Leg    Pain Orientation  Right;Lower    Pain Descriptors / Indicators  Sharp   "shocking, intense"   Pain Type  Chronic pain    Pain Onset  More than a month ago    Pain Frequency  Constant    Aggravating Factors   sitting, not taking Gabapentin    Pain Relieving Factors  Gabapentin, Motrin    Effect of Pain on Daily Activities  increase         [facility] PT Assessment - 01/21/19 0001      Ambulation/Gait   Ambulation Distance (Feet)  406 Feet   3MWT   Assistive device  Straight cane   bil   Gait Pattern  Step-through pattern;Decreased hip/knee flexion - right;Decreased  dorsiflexion - right;Trendelenburg;Lateral trunk lean to left           [facility] Adult PT Treatment/Exercise - 01/21/19 0001      Exercises   Exercises  Knee/Hip      Knee/Hip Exercises: Standing   Hip Flexion  Both;20 reps    Hip Flexion Limitations  marching with BUE support    Hip Abduction  Both;10 reps    Abduction Limitations  BUE support    Hip Extension  Both;10 reps    Extension Limitations  BUE support    Lateral Step Up  Both;10 reps    Lateral Step Up Limitations  BUE support    Forward Step Up  Both;10 reps    Forward Step Up Limitations  BUE support    Functional Squat  2 sets;10 reps    Functional Squat Limitations  Mini squat, RTB around knees      Knee/Hip Exercises: Seated   Sit to Sand  2 sets;10 reps   with and without RTB around knees, 1 UE with RTB 2nd set     Knee/Hip Exercises: Supine   Other Supine Knee/Hip Exercises  Clams with RTB, x10 reps each      Knee/Hip Exercises: Sidelying   Clams  R reverse clams, x10 reps with modA to complete ROM          Balance Exercises - 01/21/19 1147      Balance Exercises: Standing   Tandem Stance  Eyes open;3 reps;10 secs    Other Standing Exercises  bil tandem stance +paloff press RTB (resisting ER moment) x5 reps each        PT Education - 01/21/19 1113    Education Details  reviewed goals, exercise technique, continue HEP    Person(s) Educated  Patient    Methods  Explanation;Demonstration    Comprehension  Verbalized understanding;Returned demonstration       PT Short Term Goals - 01/19/19 1228      PT SHORT TERM GOAL #1   Title  Pt will have improved MMT by 1/2 grade throughout proximal hip mm in order to maximize gait and balance.    Time  3    Period  Weeks    Status  New    Target Date  02/09/19  PT SHORT TERM GOAL #2   Title  Pt will have improved 30sec chair rise test to 13x to demo improved functional strength and balance.    Time  3    Period  Weeks    Status  New      PT  SHORT TERM GOAL #3   Title  Pt will have improved DGI score to 16/24 to demo improved dynamic balance with gait and reduce her risk for falling.    Time  3    Period  Weeks    Status  New        PT Long Term Goals - 01/19/19 1230      PT LONG TERM GOAL #1   Title  Pt will have improved MMT by 1 grade throughout proximal hip mm in order to further maximize gait and balance.    Time  6    Period  Weeks    Status  New    Target Date  03/02/19      PT LONG TERM GOAL #2   Title  Pt will have improved L SLS to 10sec without UE and improved R SLS to 3sec without UE in order to demo improved functional hip and core strength and to maximize her gait.    Time  6    Period  Weeks    Status  New      PT LONG TERM GOAL #3   Title  Pt will have improved 3MWT by 153ft with LRAD in order to demo improved functional tolerance to mobility and maximize her community access.    Time  6    Period  Weeks    Status  New      PT LONG TERM GOAL #4   Title  Pt will report being able to stand for 10 mins without UE support to demo improved BLE strength, endurance, and balance in order to allow her to stand and perform self-hygiene and prepare a small meal in the kitchen with greater ease.    Time  6    Period  Weeks    Status  New            Plan - 01/21/19 1205    Clinical Impression Statement  Began session by reviewing goals and performing 3MWT. Pt amb 48ft with bil crutches. Rest of session focused on functional strenghteing and balance. Pt noted to have increased R hip ER and compensated with hip add for swing-thru phase of gait secondary to weak hip flexors. Initiated reverse clams and pt required mod A to complete thru full ROM due to weakness. Regressed pt's sidelying clams for HEP to supine with RTB; still challenging to pt but she was able to complete without assistance. Continue as planned, progressing as able.    Personal Factors and Comorbidities  Age;Comorbidity 3+;Past/Current  Experience;Time since onset of injury/illness/exacerbation    Comorbidities  see above    Examination-Activity Limitations  Locomotion Level;Stand    Examination-Participation Restrictions  Community Activity;Yard Work    Stability/Clinical Decision Making  Stable/Uncomplicated    Rehab Potential  Good    PT Frequency  2x / week    PT Duration  6 weeks    PT Treatment/Interventions  ADLs/Self Care Home Management;Aquatic Therapy;Cryotherapy;Electrical Stimulation;Moist Heat;DME Instruction;Gait training;Stair training;Functional mobility training;Therapeutic activities;Therapeutic exercise;Balance training;Neuromuscular re-education;Patient/family education;Orthotic Fit/Training;Manual techniques;Wheelchair mobility training;Passive range of motion;Scar mobilization;Dry needling;Energy conservation;Taping    PT Next Visit Plan  continue BLE, functional strength training, endurance training, balance training; continue  to address hip IR strength with paloff press in standing, etc.    PT Home Exercise Plan  eval: bridging, STS; 6/25: supine clam RTB, sidelying reverse clams with husband's assistance    Consulted and Agree with Plan of Care  Patient       Patient will benefit from skilled therapeutic intervention in order to improve the following deficits and impairments:  Abnormal gait, Decreased activity tolerance, Decreased balance, Decreased endurance, Decreased mobility, Decreased strength, Difficulty walking, Hypomobility, Increased fascial restricitons, Increased muscle spasms, Impaired flexibility, Improper body mechanics, Pain  Visit Diagnosis: 1. Muscle weakness (generalized)   2. Difficulty in walking, not elsewhere classified   3. Other symptoms and signs involving the musculoskeletal system   4. Other symptoms and signs involving the nervous system        Problem List Patient Active Problem List   Diagnosis Date Noted  . Postmenopausal 02/24/2018  . Pain syndrome, chronic  08/06/2015  . Lower extremity pain, left 04/12/2015  . Osteoporosis 11/21/2014  . Breast cancer of upper-outer quadrant of left female breast ([place]) 07/11/2014  . Abscess of axilla, left 06/27/2011  . HYPERLIPIDEMIA 01/26/2008  . REFLEX SYMPATHETIC DYSTROPHY 01/26/2008  . NEUROPATHY 01/26/2008  . HEMORRHOIDS, INTERNAL 01/26/2008  . ESOPHAGEAL STRICTURE 01/26/2008  . GERD 01/26/2008  . BARRETTS ESOPHAGUS 01/26/2008  . DIVERTICULOSIS, COLON 01/26/2008  . IRRITABLE BOWEL SYNDROME 01/26/2008  . SCOLIOSIS 01/26/2008  . HEPATITIS C, HX OF 01/26/2008  . MITRAL VALVE PROLAPSE, HX OF 01/26/2008       Geraldine Solar PT, DPT    [facility] [address] [place], [place], [address] Phone: [phone]   Fax:  [phone]  Name: Amanda Terrell MRN: [id number] Date of Birth: 1955-09-18

## 2019-01-25 ENCOUNTER — Other Ambulatory Visit: Payer: Self-pay

## 2019-01-25 ENCOUNTER — Ambulatory Visit (HOSPITAL_COMMUNITY): Payer: BC Managed Care – PPO | Admitting: Physical Therapy

## 2019-01-25 DIAGNOSIS — R29898 Other symptoms and signs involving the musculoskeletal system: Secondary | ICD-10-CM

## 2019-01-25 DIAGNOSIS — R29818 Other symptoms and signs involving the nervous system: Secondary | ICD-10-CM

## 2019-01-25 DIAGNOSIS — M6281 Muscle weakness (generalized): Secondary | ICD-10-CM

## 2019-01-25 DIAGNOSIS — R262 Difficulty in walking, not elsewhere classified: Secondary | ICD-10-CM

## 2019-01-25 NOTE — Therapy (Signed)
[place], [place], [address] Phone: [phone]   Fax:  [phone]  Physical Therapy Treatment  Patient Details  Name: Amanda Terrell MRN: [id number] Date of Birth: [birth year] Referring Provider (PT): Glenna Fellows, MD   Encounter Date: 01/25/2019  PT End of Session - 01/25/19 1424    Visit Number  3    Number of Visits  12    Date for PT Re-Evaluation  03/02/19   mini reassess on 02/09/19   Authorization Type  [employer] Other    Authorization Time Period  01/19/19 - 03/02/19    Authorization - Visit Number  3    Authorization - Number of Visits  30   30 visit max PT/OT combined   PT Start Time  0102    PT Stop Time  1420    PT Time Calculation (min)  45 min    Equipment Utilized During Treatment  Gait belt;Other (comment)   R AFO   Activity Tolerance  Patient tolerated treatment well    Behavior During Therapy  WFL for tasks assessed/performed       Past Medical History:  Diagnosis Date  . 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 Arthritis   . Axillary abscess    left  . Barrett's esophagus   . Blood transfusion without reported diagnosis    1989  . Breast cancer of upper-outer quadrant of left female breast ([place]) 07/11/2014  . Cataract    both eyes in 2016  . Cellulitis and abscess of unspecified site   . Diverticulosis   . Esophageal stricture   . GERD (gastroesophageal reflux disease)   . Heart murmur   . Hepatitis C 1990   interferon treatment  . Hiatal hernia   . Hyperlipidemia   . IBS (irritable bowel syndrome)   . Internal hemorrhoids   . Mitral valve prolapse   . Neuropathy   . Osteoporosis   . Other specified disease of hair and hair follicles   . Personal history of radiation therapy 2016  . PONV (postoperative nausea and vomiting)   . Postmenopausal 02/24/2018  . 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 Reflex sympathetic dystrophy   . Rosacea   . Scoliosis    adhesive  arachnoditis-right side    Past Surgical History:  Procedure Laterality Date  . [place], 2006   due to scoliosis= total 5 back surgeries  . BREAST FIBROADENOMA SURGERY     bilateral  . BREAST LUMPECTOMY Left   . BREAST LUMPECTOMY WITH RADIOACTIVE SEED LOCALIZATION Left 08/05/2014   Procedure: LEFT PARTICAL MASTECTOMY WITH RADIOACTIVE SEED LOCALIZATION;  Surgeon: Fanny Skates, MD;  Location: [facility];  Service: General;  Laterality: Left;  . [place], 1980, 1984  . COLONOSCOPY  2009  . HYSTEROSCOPY    . INCISE AND DRAIN ABCESS     left axillary abscess  . SPINAL FUSION    . spinal surgeries    . TONSILLECTOMY    . UPPER GASTROINTESTINAL ENDOSCOPY      There were no vitals filed for this visit.  Subjective Assessment - 01/25/19 1339    Subjective  pt states she did not sleep well Saturday night becuase she did too many exercises and made herself sore.  Reports she still has pain in her Rt groin around to lateral ankle.    Currently in Pain?  Yes    Pain  Location  Leg    Pain Orientation  Right;Lower    Pain Descriptors / Indicators  Burning;Radiating;Shooting    Pain Type  Chronic pain                       [facility] Adult PT Treatment/Exercise - 01/25/19 0001      Ambulation/Gait   Gait Comments  on 6/25 completed 406 feet in 3MWT using bil SPC      Knee/Hip Exercises: Standing   Hip Abduction  Both;2 sets;10 reps    Abduction Limitations  BUE support    Hip Extension  Both;2 sets;10 reps    Extension Limitations  BUE support    Lateral Step Up  Both;10 reps;Hand Hold: 1;2 sets    Lateral Step Up Limitations  1 UE support    Forward Step Up  Both;10 reps;2 sets;Hand Hold: 1    Forward Step Up Limitations  1 UE support    Functional Squat  2 sets;10 reps    Functional Squat Limitations  Mini squat, RTB around knees    SLS with Vectors  10X3" holds with 1 UE with Lt, 2 UE with Rt     Other Standing Knee  Exercises  Hip hiking 10 reps each with 4" step and UE assist    Other Standing Knee Exercises  tandem stance max of 4" with Lt lead, 10" with Rt lead               PT Short Term Goals - 01/19/19 1228      PT SHORT TERM GOAL #1   Title  Pt will have improved MMT by 1/2 grade throughout proximal hip mm in order to maximize gait and balance.    Time  3    Period  Weeks    Status  New    Target Date  02/09/19      PT SHORT TERM GOAL #2   Title  Pt will have improved 30sec chair rise test to 13x to demo improved functional strength and balance.    Time  3    Period  Weeks    Status  New      PT SHORT TERM GOAL #3   Title  Pt will have improved DGI score to 16/24 to demo improved dynamic balance with gait and reduce her risk for falling.    Time  3    Period  Weeks    Status  New        PT Long Term Goals - 01/19/19 1230      PT LONG TERM GOAL #1   Title  Pt will have improved MMT by 1 grade throughout proximal hip mm in order to further maximize gait and balance.    Time  6    Period  Weeks    Status  New    Target Date  03/02/19      PT LONG TERM GOAL #2   Title  Pt will have improved L SLS to 10sec without UE and improved R SLS to 3sec without UE in order to demo improved functional hip and core strength and to maximize her gait.    Time  6    Period  Weeks    Status  New      PT LONG TERM GOAL #3   Title  Pt will have improved 3MWT by 145ft with LRAD in order to demo improved functional tolerance to mobility and maximize her community access.  Time  6    Period  Weeks    Status  New      PT LONG TERM GOAL #4   Title  Pt will report being able to stand for 10 mins without UE support to demo improved BLE strength, endurance, and balance in order to allow her to stand and perform self-hygiene and prepare a small meal in the kitchen with greater ease.    Time  6    Period  Weeks    Status  New            Plan - 01/25/19 1425    Clinical Impression  Statement  Continued with bilateral LE strengthening with addition of static balance this session.  Added additional set and attempted therex with 1 less UE to increase challenge of LE's.  pt able to do with most exercises, howver unable to complete with vectors.  Tandem balance challenging for pateint with max of 10" hold (without UE's) with Rt in front and 4" with Lt in front.  encouraged to continue currentl HEP and ensure she is counting her repetitions and not doing too much.    Personal Factors and Comorbidities  Age;Comorbidity 3+;Past/Current Experience;Time since onset of injury/illness/exacerbation    Comorbidities  see above    Examination-Activity Limitations  Locomotion Level;Stand    Examination-Participation Restrictions  Community Activity;Yard Work    Stability/Clinical Decision Making  Stable/Uncomplicated    Rehab Potential  Good    PT Frequency  2x / week    PT Duration  6 weeks    PT Treatment/Interventions  ADLs/Self Care Home Management;Aquatic Therapy;Cryotherapy;Electrical Stimulation;Moist Heat;DME Instruction;Gait training;Stair training;Functional mobility training;Therapeutic activities;Therapeutic exercise;Balance training;Neuromuscular re-education;Patient/family education;Orthotic Fit/Training;Manual techniques;Wheelchair mobility training;Passive range of motion;Scar mobilization;Dry needling;Energy conservation;Taping    PT Next Visit Plan  continue BLE, functional strength training, endurance training, balance training; continue to address hip IR strength with paloff press in standing, etc.    PT Home Exercise Plan  eval: bridging, STS; 6/25: supine clam RTB, sidelying reverse clams with husband's assistance    Consulted and Agree with Plan of Care  Patient       Patient will benefit from skilled therapeutic intervention in order to improve the following deficits and impairments:  Abnormal gait, Decreased activity tolerance, Decreased balance, Decreased endurance,  Decreased mobility, Decreased strength, Difficulty walking, Hypomobility, Increased fascial restricitons, Increased muscle spasms, Impaired flexibility, Improper body mechanics, Pain  Visit Diagnosis: 1. Muscle weakness (generalized)   2. Difficulty in walking, not elsewhere classified   3. Other symptoms and signs involving the musculoskeletal system   4. Other symptoms and signs involving the nervous system        Problem List Patient Active Problem List   Diagnosis Date Noted  . Postmenopausal 02/24/2018  . Pain syndrome, chronic 08/06/2015  . Lower extremity pain, left 04/12/2015  . Osteoporosis 11/21/2014  . Breast cancer of upper-outer quadrant of left female breast ([place]) 07/11/2014  . Abscess of axilla, left 06/27/2011  . HYPERLIPIDEMIA 01/26/2008  . REFLEX SYMPATHETIC DYSTROPHY 01/26/2008  . NEUROPATHY 01/26/2008  . HEMORRHOIDS, INTERNAL 01/26/2008  . ESOPHAGEAL STRICTURE 01/26/2008  . GERD 01/26/2008  . BARRETTS ESOPHAGUS 01/26/2008  . DIVERTICULOSIS, COLON 01/26/2008  . IRRITABLE BOWEL SYNDROME 01/26/2008  . SCOLIOSIS 01/26/2008  . HEPATITIS C, HX OF 01/26/2008  . MITRAL VALVE PROLAPSE, HX OF 01/26/2008   Teena Irani, PTA/CLT [phone]  Teena Irani 01/25/2019, 2:29 PM  [place] [address],  [place], [address] Phone: [phone]   Fax:  [phone]  Name: Amanda Terrell MRN: [id number] Date of Birth: May 05, 1956

## 2019-01-27 ENCOUNTER — Other Ambulatory Visit: Payer: Self-pay

## 2019-01-27 ENCOUNTER — Ambulatory Visit (HOSPITAL_COMMUNITY): Payer: BC Managed Care – PPO | Attending: Neurosurgery | Admitting: Physical Therapy

## 2019-01-27 DIAGNOSIS — R29898 Other symptoms and signs involving the musculoskeletal system: Secondary | ICD-10-CM | POA: Insufficient documentation

## 2019-01-27 DIAGNOSIS — M6281 Muscle weakness (generalized): Secondary | ICD-10-CM

## 2019-01-27 DIAGNOSIS — R29818 Other symptoms and signs involving the nervous system: Secondary | ICD-10-CM | POA: Diagnosis present

## 2019-01-27 DIAGNOSIS — R262 Difficulty in walking, not elsewhere classified: Secondary | ICD-10-CM

## 2019-01-27 NOTE — Therapy (Signed)
[facility] [place], [place], [address] Phone: [phone]   Fax:  [phone]  Physical Therapy Treatment  Patient Details  Name: Amanda Terrell MRN: [id number] Date of Birth: Aug 04, 1955 Referring Provider (PT): Glenna Fellows, MD   Encounter Date: 01/27/2019  PT End of Session - 01/27/19 1122    Visit Number  4    Number of Visits  12    Date for PT Re-Evaluation  03/02/19   mini reassess on 02/09/19   Authorization Type  [employer] Other    Authorization Time Period  01/19/19 - 03/02/19    Authorization - Visit Number  4    Authorization - Number of Visits  30   30 visit max PT/OT combined   PT Start Time  5170    PT Stop Time  1125    PT Time Calculation (min)  50 min    Equipment Utilized During Treatment  Gait belt;Other (comment)   R AFO   Activity Tolerance  Patient tolerated treatment well    Behavior During Therapy  WFL for tasks assessed/performed       Past Medical History:  Diagnosis Date  . 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 Arthritis   . Axillary abscess    left  . Barrett's esophagus   . Blood transfusion without reported diagnosis    1989  . Breast cancer of upper-outer quadrant of left female breast ([place]) 07/11/2014  . Cataract    both eyes in 2016  . Cellulitis and abscess of unspecified site   . Diverticulosis   . Esophageal stricture   . GERD (gastroesophageal reflux disease)   . Heart murmur   . Hepatitis C 1990   interferon treatment  . Hiatal hernia   . Hyperlipidemia   . IBS (irritable bowel syndrome)   . Internal hemorrhoids   . Mitral valve prolapse   . Neuropathy   . Osteoporosis   . Other specified disease of hair and hair follicles   . Personal history of radiation therapy 2016  . PONV (postoperative nausea and vomiting)   . Postmenopausal 02/24/2018  . 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 Reflex sympathetic dystrophy   . Rosacea   . Scoliosis    adhesive  arachnoditis-right side    Past Surgical History:  Procedure Laterality Date  . [place], 2006   due to scoliosis= total 5 back surgeries  . BREAST FIBROADENOMA SURGERY     bilateral  . BREAST LUMPECTOMY Left   . BREAST LUMPECTOMY WITH RADIOACTIVE SEED LOCALIZATION Left 08/05/2014   Procedure: LEFT PARTICAL MASTECTOMY WITH RADIOACTIVE SEED LOCALIZATION;  Surgeon: Fanny Skates, MD;  Location: [facility];  Service: General;  Laterality: Left;  . [place], 1980, 1984  . COLONOSCOPY  2009  . HYSTEROSCOPY    . INCISE AND DRAIN ABCESS     left axillary abscess  . SPINAL FUSION    . spinal surgeries    . TONSILLECTOMY    . UPPER GASTROINTESTINAL ENDOSCOPY      There were no vitals filed for this visit.  Subjective Assessment - 01/27/19 1039    Subjective  pt states she is a little sore from the new exercises.  Continues to have pai in Rt groin and around to lateral ankle.    Currently in Pain?  Yes    Pain Score  7     Pain Location  Leg    Pain Orientation  Right    Pain Descriptors / Indicators  Tightness;Sharp;Shooting    Pain Type  Chronic pain                       [facility] Adult PT Treatment/Exercise - 01/27/19 0001      Knee/Hip Exercises: Standing   Forward Lunges  Both;15 reps;Limitations    Forward Lunges Limitations  2 finger holds onto 4" step working on control and reducing knee valgus    Hip Abduction  Both;2 sets;15 reps    Abduction Limitations  BUE support    Hip Extension  Both;2 sets;15 reps    Extension Limitations  BUE support    Lateral Step Up  Both;Hand Hold: 1;2 sets;15 reps    Lateral Step Up Limitations  1 UE support    Forward Step Up  Both;2 sets;Hand Hold: 1;15 reps    Forward Step Up Limitations  1 UE support    Functional Squat  2 sets;15 reps    Functional Squat Limitations  Mini squat    Other Standing Knee Exercises  tandem stance max of 4" with Lt lead, 10" with Rt lead                PT Short Term Goals - 01/19/19 1228      PT SHORT TERM GOAL #1   Title  Pt will have improved MMT by 1/2 grade throughout proximal hip mm in order to maximize gait and balance.    Time  3    Period  Weeks    Status  New    Target Date  02/09/19      PT SHORT TERM GOAL #2   Title  Pt will have improved 30sec chair rise test to 13x to demo improved functional strength and balance.    Time  3    Period  Weeks    Status  New      PT SHORT TERM GOAL #3   Title  Pt will have improved DGI score to 16/24 to demo improved dynamic balance with gait and reduce her risk for falling.    Time  3    Period  Weeks    Status  New        PT Long Term Goals - 01/19/19 1230      PT LONG TERM GOAL #1   Title  Pt will have improved MMT by 1 grade throughout proximal hip mm in order to further maximize gait and balance.    Time  6    Period  Weeks    Status  New    Target Date  03/02/19      PT LONG TERM GOAL #2   Title  Pt will have improved L SLS to 10sec without UE and improved R SLS to 3sec without UE in order to demo improved functional hip and core strength and to maximize her gait.    Time  6    Period  Weeks    Status  New      PT LONG TERM GOAL #3   Title  Pt will have improved 3MWT by 166ft with LRAD in order to demo improved functional tolerance to mobility and maximize her community access.    Time  6    Period  Weeks    Status  New      PT LONG TERM GOAL #4   Title  Pt will report being  able to stand for 10 mins without UE support to demo improved BLE strength, endurance, and balance in order to allow her to stand and perform self-hygiene and prepare a small meal in the kitchen with greater ease.    Time  6    Period  Weeks    Status  New            Plan - 01/27/19 1122    Clinical Impression Statement  contiued with established therex with addition of shallow lunges this session, working on weight shifting with contro and stability.  PT able to  complete with 2 finger assist.  Noted approvement in stability this session and improved hip hiking activation in Rt hip this session.  Overall improving.    Personal Factors and Comorbidities  Age;Comorbidity 3+;Past/Current Experience;Time since onset of injury/illness/exacerbation    Comorbidities  see above    Examination-Activity Limitations  Locomotion Level;Stand;Sleep    Examination-Participation Restrictions  Community Activity;Yard Work    Stability/Clinical Decision Making  Stable/Uncomplicated    Rehab Potential  Good    PT Frequency  2x / week    PT Duration  6 weeks    PT Treatment/Interventions  ADLs/Self Care Home Management;Aquatic Therapy;Cryotherapy;Electrical Stimulation;Moist Heat;DME Instruction;Gait training;Stair training;Functional mobility training;Therapeutic activities;Therapeutic exercise;Balance training;Neuromuscular re-education;Patient/family education;Orthotic Fit/Training;Manual techniques;Wheelchair mobility training;Passive range of motion;Scar mobilization;Dry needling;Energy conservation;Taping    PT Next Visit Plan  continue BLE, functional strength training, endurance training, balance training; continue to address hip IR strength with paloff press in standing when tandem stance improves.    PT Home Exercise Plan  eval: bridging, STS; 6/25: supine clam RTB, sidelying reverse clams with husband's assistance    Consulted and Agree with Plan of Care  Patient       Patient will benefit from skilled therapeutic intervention in order to improve the following deficits and impairments:  Abnormal gait, Decreased activity tolerance, Decreased balance, Decreased endurance, Decreased mobility, Decreased strength, Difficulty walking, Hypomobility, Increased fascial restricitons, Increased muscle spasms, Impaired flexibility, Improper body mechanics, Pain  Visit Diagnosis: 1. Muscle weakness (generalized)   2. Difficulty in walking, not elsewhere classified   3. Other  symptoms and signs involving the musculoskeletal system   4. Other symptoms and signs involving the nervous system        Problem List Patient Active Problem List   Diagnosis Date Noted  . Postmenopausal 02/24/2018  . Pain syndrome, chronic 08/06/2015  . Lower extremity pain, left 04/12/2015  . Osteoporosis 11/21/2014  . Breast cancer of upper-outer quadrant of left female breast ([place]) 07/11/2014  . Abscess of axilla, left 06/27/2011  . HYPERLIPIDEMIA 01/26/2008  . REFLEX SYMPATHETIC DYSTROPHY 01/26/2008  . NEUROPATHY 01/26/2008  . HEMORRHOIDS, INTERNAL 01/26/2008  . ESOPHAGEAL STRICTURE 01/26/2008  . GERD 01/26/2008  . BARRETTS ESOPHAGUS 01/26/2008  . DIVERTICULOSIS, COLON 01/26/2008  . IRRITABLE BOWEL SYNDROME 01/26/2008  . SCOLIOSIS 01/26/2008  . HEPATITIS C, HX OF 01/26/2008  . MITRAL VALVE PROLAPSE, HX OF 01/26/2008   Teena Irani, PTA/CLT [phone]  Teena Irani 01/27/2019, 11:44 AM  [facility] [address] [place], [place], [address] Phone: [phone]   Fax:  [phone]  Name: Amanda Terrell MRN: [id number] Date of Birth: 1956/07/26

## 2019-02-02 ENCOUNTER — Encounter (HOSPITAL_COMMUNITY): Payer: Self-pay

## 2019-02-02 ENCOUNTER — Other Ambulatory Visit: Payer: Self-pay

## 2019-02-02 ENCOUNTER — Ambulatory Visit (HOSPITAL_COMMUNITY): Payer: BC Managed Care – PPO

## 2019-02-02 DIAGNOSIS — R29898 Other symptoms and signs involving the musculoskeletal system: Secondary | ICD-10-CM

## 2019-02-02 DIAGNOSIS — M6281 Muscle weakness (generalized): Secondary | ICD-10-CM | POA: Diagnosis not present

## 2019-02-02 DIAGNOSIS — R29818 Other symptoms and signs involving the nervous system: Secondary | ICD-10-CM

## 2019-02-02 DIAGNOSIS — R262 Difficulty in walking, not elsewhere classified: Secondary | ICD-10-CM

## 2019-02-02 NOTE — Therapy (Signed)
[place], [place], [address] Phone: [phone]   Fax:  [phone]  Physical Therapy Treatment  Patient Details  Name: Amanda Terrell MRN: [id number] Date of Birth: July 30, 1955 Referring Provider (PT): Glenna Fellows, MD   Encounter Date: 02/02/2019  PT End of Session - 02/02/19 0908    Visit Number  5    Number of Visits  12    Date for PT Re-Evaluation  03/02/19   mini reassess on 02/09/19   Authorization Type  BC[employer] Other    Authorization Time Period  01/19/19 - 03/02/19    Authorization - Visit Number  5    Authorization - Number of Visits  30   30 visit max PT/OT combined   PT Start Time  0818    PT Stop Time  0902    PT Time Calculation (min)  44 min    Equipment Utilized During Treatment  Gait belt;Other (comment)   R AFO   Activity Tolerance  Patient tolerated treatment well    Behavior During Therapy  WFL for tasks assessed/performed       Past Medical History:  Diagnosis Date  . 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 Arthritis   . Axillary abscess    left  . Barrett's esophagus   . Blood transfusion without reported diagnosis    1989  . Breast cancer of upper-outer quadrant of left female breast ([place]) 07/11/2014  . Cataract    both eyes in 2016  . Cellulitis and abscess of unspecified site   . Diverticulosis   . Esophageal stricture   . GERD (gastroesophageal reflux disease)   . Heart murmur   . Hepatitis C 1990   interferon treatment  . Hiatal hernia   . Hyperlipidemia   . IBS (irritable bowel syndrome)   . Internal hemorrhoids   . Mitral valve prolapse   . Neuropathy   . Osteoporosis   . Other specified disease of hair and hair follicles   . Personal history of radiation therapy 2016  . PONV (postoperative nausea and vomiting)   . Postmenopausal 02/24/2018  . 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 Reflex sympathetic dystrophy   . Rosacea   . Scoliosis    adhesive  arachnoditis-right side    Past Surgical History:  Procedure Laterality Date  . [place], 2006   due to scoliosis= total 5 back surgeries  . BREAST FIBROADENOMA SURGERY     bilateral  . BREAST LUMPECTOMY Left   . BREAST LUMPECTOMY WITH RADIOACTIVE SEED LOCALIZATION Left 08/05/2014   Procedure: LEFT PARTICAL MASTECTOMY WITH RADIOACTIVE SEED LOCALIZATION;  Surgeon: Fanny Skates, MD;  Location: [facility];  Service: General;  Laterality: Left;  . [place], 1980, 1984  . COLONOSCOPY  2009  . HYSTEROSCOPY    . INCISE AND DRAIN ABCESS     left axillary abscess  . SPINAL FUSION    . spinal surgeries    . TONSILLECTOMY    . UPPER GASTROINTESTINAL ENDOSCOPY      There were no vitals filed for this visit.  Subjective Assessment - 02/02/19 0817    Subjective  Pt reports 7/10 pain currently.    Limitations  Walking;Standing    How long can you sit comfortably?  limited a little due to pain    How long can you stand comfortably?  has to lean due to BLE weakness; <1  min if no UE support    How long can you walk comfortably?  <1 mile if she had to    Patient Stated Goals  improve strength and balnace    Currently in Pain?  Yes    Pain Score  7     Pain Location  Leg    Pain Orientation  Right    Pain Descriptors / Indicators  Tightness;Sharp;Shooting    Pain Type  Chronic pain    Pain Onset  More than a month ago    Pain Frequency  Constant    Aggravating Factors   sitting, not taking Gabapentin    Pain Relieving Factors  Gabapentin, Motrin    Effect of Pain on Daily Activities  increase         [facility] Adult PT Treatment/Exercise - 02/02/19 0001      Knee/Hip Exercises: Stretches   Other Knee/Hip Stretches  Figure 4 stretch, 3x30 sec RLE      Knee/Hip Exercises: Machines for Strengthening   Cybex Leg Press  2x10 reps, 30#   postion R LE with neutral hip position     Knee/Hip Exercises: Standing   Forward Step Up  Both;2  sets;15 reps;Step Height: 6"    Forward Step Up Limitations  1 UE support    Wall Squat  5 reps;10 seconds    Other Standing Knee Exercises  Hip hiking 2x10 reps each, with 4" step and UE assist      Knee/Hip Exercises: Supine   Bridges  2 sets;10 reps    Bridges Limitations  push through heel      Knee/Hip Exercises: Sidelying   Clams  R clams, therapist resistance for isometric contraction, x10 reps    Other Sidelying Knee/Hip Exercises  R reverse clams, therapist resistance for isometric contraction, x10 reps          Balance Exercises - 02/02/19 0904      Balance Exercises: Standing   Tandem Stance  Eyes open;Intermittent upper extremity support;3 reps;30 secs        PT Education - 02/02/19 0908    Education Details  Updated HEP, exercise technique    Person(s) Educated  Patient    Methods  Explanation;Demonstration;Tactile cues;Verbal cues;Handout    Comprehension  Verbalized understanding;Returned demonstration       PT Short Term Goals - 01/19/19 1228      PT SHORT TERM GOAL #1   Title  Pt will have improved MMT by 1/2 grade throughout proximal hip mm in order to maximize gait and balance.    Time  3    Period  Weeks    Status  New    Target Date  02/09/19      PT SHORT TERM GOAL #2   Title  Pt will have improved 30sec chair rise test to 13x to demo improved functional strength and balance.    Time  3    Period  Weeks    Status  New      PT SHORT TERM GOAL #3   Title  Pt will have improved DGI score to 16/24 to demo improved dynamic balance with gait and reduce her risk for falling.    Time  3    Period  Weeks    Status  New        PT Long Term Goals - 01/19/19 1230      PT LONG TERM GOAL #1   Title  Pt will have improved MMT by 1 grade  throughout proximal hip mm in order to further maximize gait and balance.    Time  6    Period  Weeks    Status  New    Target Date  03/02/19      PT LONG TERM GOAL #2   Title  Pt will have improved L SLS to  10sec without UE and improved R SLS to 3sec without UE in order to demo improved functional hip and core strength and to maximize her gait.    Time  6    Period  Weeks    Status  New      PT LONG TERM GOAL #3   Title  Pt will have improved 3MWT by 156ft with LRAD in order to demo improved functional tolerance to mobility and maximize her community access.    Time  6    Period  Weeks    Status  New      PT LONG TERM GOAL #4   Title  Pt will report being able to stand for 10 mins without UE support to demo improved BLE strength, endurance, and balance in order to allow her to stand and perform self-hygiene and prepare a small meal in the kitchen with greater ease.    Time  6    Period  Weeks    Status  New            Plan - 02/02/19 9470    Clinical Impression Statement  Continued with established POC for strengthening BLE and balance exercises. Added leg press with 30# and wall sit to further target quad strength, requiring assistance to properly align R LE to avoid external rotation or knee valgus. Pt continues to demonstrate R glute med weakness with clams hip hikes requiring assistance to complete full ROM and with improved contraction with resistance from therapist. Pt continues to demonstrate defiicts with balance activities requiring intermittent UE support on parallel bars with tandem stance. Pt able to hold with RLE forward/LLE back with less trunk shifting and for 10-15 seconds, while with LLE forward/BLE back pt with more instability shifting trunk around and only able to hold for up to 5 second increments. Added to pt's HEP this date to progress strengthening at home per pt's request that she is able to complete exercises without difficulty or soreness at home. Pt denies increase in pain at EOS. Continue to progress as tolerable.    Personal Factors and Comorbidities  Age;Comorbidity 3+;Past/Current Experience;Time since onset of injury/illness/exacerbation    Comorbidities  see  above    Examination-Activity Limitations  Locomotion Level;Stand;Sleep    Examination-Participation Restrictions  Community Activity;Yard Work    Stability/Clinical Decision Making  Stable/Uncomplicated    Rehab Potential  Good    PT Frequency  2x / week    PT Duration  6 weeks    PT Treatment/Interventions  ADLs/Self Care Home Management;Aquatic Therapy;Cryotherapy;Electrical Stimulation;Moist Heat;DME Instruction;Gait training;Stair training;Functional mobility training;Therapeutic activities;Therapeutic exercise;Balance training;Neuromuscular re-education;Patient/family education;Orthotic Fit/Training;Manual techniques;Wheelchair mobility training;Passive range of motion;Scar mobilization;Dry needling;Energy conservation;Taping    PT Next Visit Plan  continue BLE, functional strength training, endurance training, balance training; continue to address hip IR strength with paloff press in standing when tandem stance improves.    PT Home Exercise Plan  eval: bridging, STS; 6/25: supine clam RTB, sidelying reverse clams with husband's assistance; 7/7: bridge (push through heels cues), mini squat, wall squat, hip hikes on step, tandem stance with support surface close by if needed for any exercise  Consulted and Agree with Plan of Care  Patient       Patient will benefit from skilled therapeutic intervention in order to improve the following deficits and impairments:  Abnormal gait, Decreased activity tolerance, Decreased balance, Decreased endurance, Decreased mobility, Decreased strength, Difficulty walking, Hypomobility, Increased fascial restricitons, Increased muscle spasms, Impaired flexibility, Improper body mechanics, Pain  Visit Diagnosis: 1. Muscle weakness (generalized)   2. Difficulty in walking, not elsewhere classified   3. Other symptoms and signs involving the musculoskeletal system   4. Other symptoms and signs involving the nervous system        Problem List Patient  Active Problem List   Diagnosis Date Noted  . Postmenopausal 02/24/2018  . Pain syndrome, chronic 08/06/2015  . Lower extremity pain, left 04/12/2015  . Osteoporosis 11/21/2014  . Breast cancer of upper-outer quadrant of left female breast ([place]) 07/11/2014  . Abscess of axilla, left 06/27/2011  . HYPERLIPIDEMIA 01/26/2008  . REFLEX SYMPATHETIC DYSTROPHY 01/26/2008  . NEUROPATHY 01/26/2008  . HEMORRHOIDS, INTERNAL 01/26/2008  . ESOPHAGEAL STRICTURE 01/26/2008  . GERD 01/26/2008  . BARRETTS ESOPHAGUS 01/26/2008  . DIVERTICULOSIS, COLON 01/26/2008  . IRRITABLE BOWEL SYNDROME 01/26/2008  . SCOLIOSIS 01/26/2008  . HEPATITIS C, HX OF 01/26/2008  . MITRAL VALVE PROLAPSE, HX OF 01/26/2008     Talbot Grumbling PT, DPT  [facility] [address] [place], [place], [address] Phone: [phone]   Fax:  [phone]  Name: Amanda Terrell MRN: [id number] Date of Birth: [birth year]

## 2019-02-04 ENCOUNTER — Encounter (HOSPITAL_COMMUNITY): Payer: Self-pay

## 2019-02-04 ENCOUNTER — Other Ambulatory Visit: Payer: Self-pay

## 2019-02-04 ENCOUNTER — Ambulatory Visit (HOSPITAL_COMMUNITY): Payer: BC Managed Care – PPO

## 2019-02-04 DIAGNOSIS — R262 Difficulty in walking, not elsewhere classified: Secondary | ICD-10-CM

## 2019-02-04 DIAGNOSIS — M6281 Muscle weakness (generalized): Secondary | ICD-10-CM | POA: Diagnosis not present

## 2019-02-04 DIAGNOSIS — R29818 Other symptoms and signs involving the nervous system: Secondary | ICD-10-CM

## 2019-02-04 DIAGNOSIS — R29898 Other symptoms and signs involving the musculoskeletal system: Secondary | ICD-10-CM

## 2019-02-04 NOTE — Patient Instructions (Signed)
Abduction / Adduction: Controlled Motion (Supine)    Laying on your back with knee bent.  Slowly adduct/abduct knee with husband's resistance on either side.  Hold for 5-10" both directions Copyright  VHI. All rights reserved.

## 2019-02-04 NOTE — Therapy (Signed)
[place], [place], [address] Phone: [phone]   Fax:  [phone]  Physical Therapy Treatment  Patient Details  Name: Amanda Terrell MRN: [id number] Date of Birth: 09/18/1955 Referring Provider (PT): Glenna Fellows, MD   Encounter Date: 02/04/2019  PT End of Session - 02/04/19 0929    Visit Number  6    Number of Visits  12    Date for PT Re-Evaluation  03/02/19   Minireassess 02/09/19   Authorization Type  BCBS Other    Authorization Time Period  01/19/19 - 03/02/19    Authorization - Visit Number  6    Authorization - Number of Visits  30   30 visit PT/OT combined   PT Start Time  0923    PT Stop Time  1007    PT Time Calculation (min)  44 min    Equipment Utilized During Treatment  Gait belt;Other (comment)   Rt AFO   Activity Tolerance  Patient tolerated treatment well    Behavior During Therapy  WFL for tasks assessed/performed       Past Medical History:  Diagnosis Date  . 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 Arthritis   . Axillary abscess    left  . Barrett's esophagus   . Blood transfusion without reported diagnosis    1989  . Breast cancer of upper-outer quadrant of left female breast ([place]) 07/11/2014  . Cataract    both eyes in 2016  . Cellulitis and abscess of unspecified site   . Diverticulosis   . Esophageal stricture   . GERD (gastroesophageal reflux disease)   . Heart murmur   . Hepatitis C 1990   interferon treatment  . Hiatal hernia   . Hyperlipidemia   . IBS (irritable bowel syndrome)   . Internal hemorrhoids   . Mitral valve prolapse   . Neuropathy   . Osteoporosis   . Other specified disease of hair and hair follicles   . Personal history of radiation therapy 2016  . PONV (postoperative nausea and vomiting)   . Postmenopausal 02/24/2018  . 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 Reflex sympathetic dystrophy   . Rosacea   . Scoliosis    adhesive  arachnoditis-right side    Past Surgical History:  Procedure Laterality Date  . [place], 2006   due to scoliosis= total 5 back surgeries  . BREAST FIBROADENOMA SURGERY     bilateral  . BREAST LUMPECTOMY Left   . BREAST LUMPECTOMY WITH RADIOACTIVE SEED LOCALIZATION Left 08/05/2014   Procedure: LEFT PARTICAL MASTECTOMY WITH RADIOACTIVE SEED LOCALIZATION;  Surgeon: Fanny Skates, MD;  Location: [facility];  Service: General;  Laterality: Left;  . [place], 1980, 1984  . COLONOSCOPY  2009  . HYSTEROSCOPY    . INCISE AND DRAIN ABCESS     left axillary abscess  . SPINAL FUSION    . spinal surgeries    . TONSILLECTOMY    . UPPER GASTROINTESTINAL ENDOSCOPY      There were no vitals filed for this visit.  Subjective Assessment - 02/04/19 0926    Subjective  Pt stated pain 7/10 currently from Rt SI joint down to ankle.    Currently in Pain?  Yes    Pain Score  7     Pain Location  Leg    Pain Orientation  Right    Pain Descriptors /  Indicators  Burning;Sharp    Pain Type  Chronic pain    Pain Radiating Towards  can radiate up and down her leg    Pain Onset  More than a month ago    Pain Frequency  Constant    Aggravating Factors   sitting, not taking Gabapentin    Pain Relieving Factors  Gabapentin, Motrin    Effect of Pain on Daily Activities  increase                       [facility] Adult PT Treatment/Exercise - 02/04/19 0001      Knee/Hip Exercises: Machines for Strengthening   Cybex Leg Press  resume next tx      Knee/Hip Exercises: Standing   Functional Squat  15 reps    Functional Squat Limitations  Mini squat    Wall Squat  5 reps;10 seconds    Other Standing Knee Exercises  Hip hiking 2x10 reps each, with 4" step and UE assist    Other Standing Knee Exercises  Tandem stance Rt forward max of 13"; Lt lead at 7" prior LOB      Knee/Hip Exercises: Seated   Other Seated Knee/Hip Exercises  IR/ER with  therapist resistance       Knee/Hip Exercises: Supine   Bridges  2 sets;10 reps    Bridges Limitations  push through heel    Bridges with [employer]  10 reps   ball squeeze prior bridge   Other Supine Knee/Hip Exercises  Clams with therapsit resistance IR and ER               PT Short Term Goals - 01/19/19 1228      PT SHORT TERM GOAL #1   Title  Pt will have improved MMT by 1/2 grade throughout proximal hip mm in order to maximize gait and balance.    Time  3    Period  Weeks    Status  New    Target Date  02/09/19      PT SHORT TERM GOAL #2   Title  Pt will have improved 30sec chair rise test to 13x to demo improved functional strength and balance.    Time  3    Period  Weeks    Status  New      PT SHORT TERM GOAL #3   Title  Pt will have improved DGI score to 16/24 to demo improved dynamic balance with gait and reduce her risk for falling.    Time  3    Period  Weeks    Status  New        PT Long Term Goals - 01/19/19 1230      PT LONG TERM GOAL #1   Title  Pt will have improved MMT by 1 grade throughout proximal hip mm in order to further maximize gait and balance.    Time  6    Period  Weeks    Status  New    Target Date  03/02/19      PT LONG TERM GOAL #2   Title  Pt will have improved L SLS to 10sec without UE and improved R SLS to 3sec without UE in order to demo improved functional hip and core strength and to maximize her gait.    Time  6    Period  Weeks    Status  New      PT LONG TERM GOAL #3  Title  Pt will have improved 3MWT by 131ft with LRAD in order to demo improved functional tolerance to mobility and maximize her community access.    Time  6    Period  Weeks    Status  New      PT LONG TERM GOAL #4   Title  Pt will report being able to stand for 10 mins without UE support to demo improved BLE strength, endurance, and balance in order to allow her to stand and perform self-hygiene and prepare a small meal in the kitchen with  greater ease.    Time  6    Period  Weeks    Status  New            Plan - 02/04/19 1312    Clinical Impression Statement  Continued wiht established POC for strengthening BLE and balance exercises.  Increased focus this session with IR strengtheing with additional isometric exercises in supine clam position and seated.  Therapist had to assist LE for proper movements to reduce compensation.  Pt presents with improved tandem stance hold time without HHA, continues to have difficulty wiht balance and proper stance.  Used mirror feedback to improve form wiht tandem stance, hip hikes and squatting activities today to improve awareness of form and equal weight bearing.  No reports of pain through session, was limited by fatigue.    Personal Factors and Comorbidities  Age;Comorbidity 3+;Past/Current Experience;Time since onset of injury/illness/exacerbation    Comorbidities  see above    Examination-Activity Limitations  Locomotion Level;Stand;Sleep    Examination-Participation Restrictions  Community Activity;Yard Work    Stability/Clinical Decision Making  Stable/Uncomplicated    Designer, jewellery  Low    Rehab Potential  Good    PT Frequency  2x / week    PT Duration  6 weeks    PT Treatment/Interventions  ADLs/Self Care Home Management;Aquatic Therapy;Cryotherapy;Electrical Stimulation;Moist Heat;DME Instruction;Gait training;Stair training;Functional mobility training;Therapeutic activities;Therapeutic exercise;Balance training;Neuromuscular re-education;Patient/family education;Orthotic Fit/Training;Manual techniques;Wheelchair mobility training;Passive range of motion;Scar mobilization;Dry needling;Energy conservation;Taping    PT Next Visit Plan  Resume leg press next session.  continue BLE, functional strength training, endurance training, balance training; continue to address hip IR strength with paloff press in standing when tandem stance improves.    PT Home Exercise Plan   eval: bridging, STS; 6/25: supine clam RTB, sidelying reverse clams with husband's assistance; 7/7: bridge (push through heels cues), mini squat, wall squat, hip hikes on step, tandem stance with support surface close by if needed for any exercise       Patient will benefit from skilled therapeutic intervention in order to improve the following deficits and impairments:  Abnormal gait, Decreased activity tolerance, Decreased balance, Decreased endurance, Decreased mobility, Decreased strength, Difficulty walking, Hypomobility, Increased fascial restricitons, Increased muscle spasms, Impaired flexibility, Improper body mechanics, Pain  Visit Diagnosis: 1. Difficulty in walking, not elsewhere classified   2. Other symptoms and signs involving the musculoskeletal system   3. Other symptoms and signs involving the nervous system   4. Muscle weakness (generalized)        Problem List Patient Active Problem List   Diagnosis Date Noted  . Postmenopausal 02/24/2018  . Pain syndrome, chronic 08/06/2015  . Lower extremity pain, left 04/12/2015  . Osteoporosis 11/21/2014  . Breast cancer of upper-outer quadrant of left female breast ([place]) 07/11/2014  . Abscess of axilla, left 06/27/2011  . HYPERLIPIDEMIA 01/26/2008  . REFLEX SYMPATHETIC DYSTROPHY 01/26/2008  . NEUROPATHY 01/26/2008  . HEMORRHOIDS, INTERNAL  01/26/2008  . ESOPHAGEAL STRICTURE 01/26/2008  . GERD 01/26/2008  . BARRETTS ESOPHAGUS 01/26/2008  . DIVERTICULOSIS, COLON 01/26/2008  . IRRITABLE BOWEL SYNDROME 01/26/2008  . SCOLIOSIS 01/26/2008  . HEPATITIS C, HX OF 01/26/2008  . MITRAL VALVE PROLAPSE, HX OF 01/26/2008   Ihor Austin, LPTA; CBIS [phone] Aldona Lento 02/04/2019, 1:19 PM  [facility] [address] [place], [place], [address] Phone: [phone]   Fax:  [phone]  Name: Amanda Terrell MRN: [id number] Date of Birth: [birth year]

## 2019-02-09 ENCOUNTER — Ambulatory Visit (HOSPITAL_COMMUNITY): Payer: BC Managed Care – PPO | Admitting: Physical Therapy

## 2019-02-11 ENCOUNTER — Ambulatory Visit (HOSPITAL_COMMUNITY): Payer: BC Managed Care – PPO

## 2019-02-16 ENCOUNTER — Other Ambulatory Visit: Payer: Self-pay

## 2019-02-16 ENCOUNTER — Ambulatory Visit (HOSPITAL_COMMUNITY): Payer: BC Managed Care – PPO | Admitting: Physical Therapy

## 2019-02-16 DIAGNOSIS — R29818 Other symptoms and signs involving the nervous system: Secondary | ICD-10-CM

## 2019-02-16 DIAGNOSIS — M6281 Muscle weakness (generalized): Secondary | ICD-10-CM

## 2019-02-16 DIAGNOSIS — R29898 Other symptoms and signs involving the musculoskeletal system: Secondary | ICD-10-CM

## 2019-02-16 DIAGNOSIS — R262 Difficulty in walking, not elsewhere classified: Secondary | ICD-10-CM

## 2019-02-16 NOTE — Therapy (Signed)
[facility] [place], [place], [address] Phone: [phone]   Fax:  [phone]  Physical Therapy Treatment  Patient Details  Name: Amanda Terrell MRN: [id number] Date of Birth: 10-26-1955 Referring Provider (PT): Glenna Fellows, MD   Encounter Date: 02/16/2019  PT End of Session - 02/16/19 1115    Visit Number  7    Number of Visits  12    Date for PT Re-Evaluation  03/02/19   Minireassess 02/09/19   Authorization Type  BC[employer] Other    Authorization Time Period  01/19/19 - 03/02/19    Authorization - Visit Number  6    Authorization - Number of Visits  30   30 visit PT/OT combined   PT Start Time  0935    PT Stop Time  1025    PT Time Calculation (min)  50 min    Equipment Utilized During Treatment  Gait belt;Other (comment)   Rt AFO   Activity Tolerance  Patient tolerated treatment well    Behavior During Therapy  WFL for tasks assessed/performed       Past Medical History:  Diagnosis Date  . 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 Arthritis   . Axillary abscess    left  . Barrett's esophagus   . Blood transfusion without reported diagnosis    1989  . Breast cancer of upper-outer quadrant of left female breast ([place]) 07/11/2014  . Cataract    both eyes in 2016  . Cellulitis and abscess of unspecified site   . Diverticulosis   . Esophageal stricture   . GERD (gastroesophageal reflux disease)   . Heart murmur   . Hepatitis C 1990   interferon treatment  . Hiatal hernia   . Hyperlipidemia   . IBS (irritable bowel syndrome)   . Internal hemorrhoids   . Mitral valve prolapse   . Neuropathy   . Osteoporosis   . Other specified disease of hair and hair follicles   . Personal history of radiation therapy 2016  . PONV (postoperative nausea and vomiting)   . Postmenopausal 02/24/2018  . 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 Reflex sympathetic dystrophy   . Rosacea   . Scoliosis    adhesive  arachnoditis-right side    Past Surgical History:  Procedure Laterality Date  . [place], 2006   due to scoliosis= total 5 back surgeries  . BREAST FIBROADENOMA SURGERY     bilateral  . BREAST LUMPECTOMY Left   . BREAST LUMPECTOMY WITH RADIOACTIVE SEED LOCALIZATION Left 08/05/2014   Procedure: LEFT PARTICAL MASTECTOMY WITH RADIOACTIVE SEED LOCALIZATION;  Surgeon: Fanny Skates, MD;  Location: [facility];  Service: General;  Laterality: Left;  . [place], 1980, 1984  . COLONOSCOPY  2009  . HYSTEROSCOPY    . INCISE AND DRAIN ABCESS     left axillary abscess  . SPINAL FUSION    . spinal surgeries    . TONSILLECTOMY    . UPPER GASTROINTESTINAL ENDOSCOPY      There were no vitals filed for this visit.  Subjective Assessment - 02/16/19 1130    Subjective  pt states she can tell she's getting stronger.  LEaving for the beach in a couple weeks.    Currently in Pain?  No/denies                       [facility]  Adult PT Treatment/Exercise - 02/16/19 0001      Knee/Hip Exercises: Aerobic   Nustep  @EOS   5 minutes hills #2 level 2 LE's only      Knee/Hip Exercises: Machines for Strengthening   Cybex Leg Press  3X10 40#      Knee/Hip Exercises: Standing   Forward Step Up  Both;2 sets;15 reps;Step Height: 6"    Forward Step Up Limitations  1 UE support    Functional Squat  15 reps    Functional Squat Limitations  Mini squat    Wall Squat  10 reps;10 seconds   wiith ball between knees   SLS with Vectors  10X3" holds with 1 UE with Lt, 2 UE with Rt     Other Standing Knee Exercises  Hip hiking 2x10 reps each, with 4" step and UE assist    Other Standing Knee Exercises  Tandem stance Rt forward on airex               PT Short Term Goals - 01/19/19 1228      PT SHORT TERM GOAL #1   Title  Pt will have improved MMT by 1/2 grade throughout proximal hip mm in order to maximize gait and balance.    Time  3     Period  Weeks    Status  New    Target Date  02/09/19      PT SHORT TERM GOAL #2   Title  Pt will have improved 30sec chair rise test to 13x to demo improved functional strength and balance.    Time  3    Period  Weeks    Status  New      PT SHORT TERM GOAL #3   Title  Pt will have improved DGI score to 16/24 to demo improved dynamic balance with gait and reduce her risk for falling.    Time  3    Period  Weeks    Status  New        PT Long Term Goals - 01/19/19 1230      PT LONG TERM GOAL #1   Title  Pt will have improved MMT by 1 grade throughout proximal hip mm in order to further maximize gait and balance.    Time  6    Period  Weeks    Status  New    Target Date  03/02/19      PT LONG TERM GOAL #2   Title  Pt will have improved L SLS to 10sec without UE and improved R SLS to 3sec without UE in order to demo improved functional hip and core strength and to maximize her gait.    Time  6    Period  Weeks    Status  New      PT LONG TERM GOAL #3   Title  Pt will have improved 3MWT by 138ft with LRAD in order to demo improved functional tolerance to mobility and maximize her community access.    Time  6    Period  Weeks    Status  New      PT LONG TERM GOAL #4   Title  Pt will report being able to stand for 10 mins without UE support to demo improved BLE strength, endurance, and balance in order to allow her to stand and perform self-hygiene and prepare a small meal in the kitchen with greater ease.    Time  6    Period  Weeks    Status  New            Plan - 02/16/19 1118    Clinical Impression Statement  conintued with established POC with noted improvement in form and ability to complete exercises in increased ROM due to strength gains.  Added tandem on foam today with good stability noted, increased holds with vectors and reps of wall squats.  VC's to avoid knee valgus when squatting. Resumed leg press today with added weight and additional set without  difficulty.   Finished session on nustep working LE's only with good results.    Personal Factors and Comorbidities  Age;Comorbidity 3+;Past/Current Experience;Time since onset of injury/illness/exacerbation    Comorbidities  see above    Examination-Activity Limitations  Locomotion Level;Stand;Sleep    Examination-Participation Restrictions  Community Activity;Yard Work    Stability/Clinical Decision Making  Stable/Uncomplicated    Rehab Potential  Good    PT Frequency  2x / week    PT Duration  6 weeks    PT Treatment/Interventions  ADLs/Self Care Home Management;Aquatic Therapy;Cryotherapy;Electrical Stimulation;Moist Heat;DME Instruction;Gait training;Stair training;Functional mobility training;Therapeutic activities;Therapeutic exercise;Balance training;Neuromuscular re-education;Patient/family education;Orthotic Fit/Training;Manual techniques;Wheelchair mobility training;Passive range of motion;Scar mobilization;Dry needling;Energy conservation;Taping    PT Next Visit Plan  Continue BLE, functional strength training, endurance training, balance training; continue to address hip IR strength with paloff press in standing when tandem stance improves.    PT Home Exercise Plan  eval: bridging, STS; 6/25: supine clam RTB, sidelying reverse clams with husband's assistance; 7/7: bridge (push through heels cues), mini squat, wall squat, hip hikes on step, tandem stance with support surface close by if needed for any exercise       Patient will benefit from skilled therapeutic intervention in order to improve the following deficits and impairments:  Abnormal gait, Decreased activity tolerance, Decreased balance, Decreased endurance, Decreased mobility, Decreased strength, Difficulty walking, Hypomobility, Increased fascial restricitons, Increased muscle spasms, Impaired flexibility, Improper body mechanics, Pain  Visit Diagnosis: 1. Other symptoms and signs involving the musculoskeletal system   2.  Other symptoms and signs involving the nervous system   3. Difficulty in walking, not elsewhere classified   4. Muscle weakness (generalized)        Problem List Patient Active Problem List   Diagnosis Date Noted  . Postmenopausal 02/24/2018  . Pain syndrome, chronic 08/06/2015  . Lower extremity pain, left 04/12/2015  . Osteoporosis 11/21/2014  . Breast cancer of upper-outer quadrant of left female breast ([place]) 07/11/2014  . Abscess of axilla, left 06/27/2011  . HYPERLIPIDEMIA 01/26/2008  . REFLEX SYMPATHETIC DYSTROPHY 01/26/2008  . NEUROPATHY 01/26/2008  . HEMORRHOIDS, INTERNAL 01/26/2008  . ESOPHAGEAL STRICTURE 01/26/2008  . GERD 01/26/2008  . BARRETTS ESOPHAGUS 01/26/2008  . DIVERTICULOSIS, COLON 01/26/2008  . IRRITABLE BOWEL SYNDROME 01/26/2008  . SCOLIOSIS 01/26/2008  . HEPATITIS C, HX OF 01/26/2008  . MITRAL VALVE PROLAPSE, HX OF 01/26/2008   Teena Irani, PTA/CLT [phone]  Teena Irani 02/16/2019, 11:31 AM  [facility] [address] [place], [place], [address] Phone: [phone]   Fax:  [phone]  Name: Amanda Terrell MRN: [id number] Date of Birth: [birth year]

## 2019-02-17 ENCOUNTER — Encounter (HOSPITAL_COMMUNITY): Payer: Self-pay

## 2019-02-17 ENCOUNTER — Ambulatory Visit (HOSPITAL_COMMUNITY): Payer: BC Managed Care – PPO

## 2019-02-17 DIAGNOSIS — M6281 Muscle weakness (generalized): Secondary | ICD-10-CM | POA: Diagnosis not present

## 2019-02-17 DIAGNOSIS — R29818 Other symptoms and signs involving the nervous system: Secondary | ICD-10-CM

## 2019-02-17 DIAGNOSIS — R29898 Other symptoms and signs involving the musculoskeletal system: Secondary | ICD-10-CM

## 2019-02-17 DIAGNOSIS — R262 Difficulty in walking, not elsewhere classified: Secondary | ICD-10-CM

## 2019-02-17 NOTE — Therapy (Signed)
[facility] [place], [place], [address] Phone: [phone]   Fax:  [phone]  Physical Therapy Treatment  Patient Details  Name: Amanda Terrell MRN: [id number] Date of Birth: [birth year] Referring Provider (PT): Glenna Fellows, MD   Encounter Date: 02/17/2019  PT End of Session - 02/17/19 0835    Visit Number  8    Number of Visits  12    Date for PT Re-Evaluation  03/02/19   Minireassess 02/09/19   Authorization Type  BCBS Other    Authorization Time Period  01/19/19 - 03/02/19    Authorization - Visit Number  7    Authorization - Number of Visits  30   30 visit PT/OT combined   PT Start Time  0822    PT Stop Time  0903    PT Time Calculation (min)  41 min    Equipment Utilized During Treatment  Gait belt;Other (comment)   Rt AFO   Activity Tolerance  Patient tolerated treatment well    Behavior During Therapy  WFL for tasks assessed/performed       Past Medical History:  Diagnosis Date  . 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 Arthritis   . Axillary abscess    left  . Barrett's esophagus   . Blood transfusion without reported diagnosis    1989  . Breast cancer of upper-outer quadrant of left female breast ([place]) 07/11/2014  . Cataract    both eyes in 2016  . Cellulitis and abscess of unspecified site   . Diverticulosis   . Esophageal stricture   . GERD (gastroesophageal reflux disease)   . Heart murmur   . Hepatitis C 1990   interferon treatment  . Hiatal hernia   . Hyperlipidemia   . IBS (irritable bowel syndrome)   . Internal hemorrhoids   . Mitral valve prolapse   . Neuropathy   . Osteoporosis   . Other specified disease of hair and hair follicles   . Personal history of radiation therapy 2016  . PONV (postoperative nausea and vomiting)   . Postmenopausal 02/24/2018  . 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 Reflex sympathetic dystrophy   . Rosacea   . Scoliosis    adhesive  arachnoditis-right side    Past Surgical History:  Procedure Laterality Date  . [place], 2006   due to scoliosis= total 5 back surgeries  . BREAST FIBROADENOMA SURGERY     bilateral  . BREAST LUMPECTOMY Left   . BREAST LUMPECTOMY WITH RADIOACTIVE SEED LOCALIZATION Left 08/05/2014   Procedure: LEFT PARTICAL MASTECTOMY WITH RADIOACTIVE SEED LOCALIZATION;  Surgeon: Fanny Skates, MD;  Location: [facility];  Service: General;  Laterality: Left;  . [place], 1980, 1984  . COLONOSCOPY  2009  . HYSTEROSCOPY    . INCISE AND DRAIN ABCESS     left axillary abscess  . SPINAL FUSION    . spinal surgeries    . TONSILLECTOMY    . UPPER GASTROINTESTINAL ENDOSCOPY      There were no vitals filed for this visit.  Subjective Assessment - 02/17/19 1059    Subjective  Patient reports she is doing well. She states her husband built a set of parallel bars in their backyard with old fencing for her to doo exercises out there. She states her daughter who is a PA asked about elstic bands for resistance training.    Limitations  Walking;Standing    Currently in Pain?  No/denies        [facility] Adult PT Treatment/Exercise - 02/17/19 0001      Knee/Hip Exercises: Standing   Hip ADduction  Both;1 set;10 reps;Limitations    Hip ADduction Limitations  Re theraband right above knees    Hip Abduction  Both;1 set;10 reps;Limitations    Abduction Limitations  Re theraband right above knees    Lateral Step Up  Both;1 set;15 reps;Hand Hold: 2;Step Height: 4"    Forward Step Up  Both;2 sets;15 reps;Step Height: 6";Hand Hold: 1    Forward Step Up Limitations  1 UE support intermittently   pt with tendency to IR hips with less support (weak ER)     Knee/Hip Exercises: Seated   Other Seated Knee/Hip Exercises  Isometric Internal rotation with belt at ankles, therapist keeping pt's knee together, 10x 5 sec holds      Knee/Hip Exercises: Prone   Other Prone  Exercises  Quadruped: fire hydrant for hip abductor/external rotator strengthening (assistance required at [facility] [place])        PT Education - 02/17/19 1103    Education Details  Updated HEP with theraband hip ext/abd exercise.    Person(s) Educated  Patient    Methods  Explanation;Demonstration;Handout    Comprehension  Verbalized understanding;Returned demonstration       PT Short Term Goals - 01/19/19 1228      PT SHORT TERM GOAL #1   Title  Pt will have improved MMT by 1/2 grade throughout proximal hip mm in order to maximize gait and balance.    Time  3    Period  Weeks    Status  New    Target Date  02/09/19      PT SHORT TERM GOAL #2   Title  Pt will have improved 30sec chair rise test to 13x to demo improved functional strength and balance.    Time  3    Period  Weeks    Status  New      PT SHORT TERM GOAL #3   Title  Pt will have improved DGI score to 16/24 to demo improved dynamic balance with gait and reduce her risk for falling.    Time  3    Period  Weeks    Status  New        PT Long Term Goals - 01/19/19 1230      PT LONG TERM GOAL #1   Title  Pt will have improved MMT by 1 grade throughout proximal hip mm in order to further maximize gait and balance.    Time  6    Period  Weeks    Status  New    Target Date  03/02/19      PT LONG TERM GOAL #2   Title  Pt will have improved L SLS to 10sec without UE and improved R SLS to 3sec without UE in order to demo improved functional hip and core strength and to maximize her gait.    Time  6    Period  Weeks    Status  New      PT LONG TERM GOAL #3   Title  Pt will have improved 3MWT by 193ft with LRAD in order to demo improved functional tolerance to mobility and maximize her community access.    Time  6    Period  Weeks    Status  New      PT  LONG TERM GOAL #4   Title  Pt will report being able to stand for 10 mins without UE support to demo improved BLE strength, endurance, and balance in order to allow her  to stand and perform self-hygiene and prepare a small meal in the kitchen with greater ease.    Time  6    Period  Weeks    Status  New           Plan - 02/17/19 7681    Clinical Impression Statement  Patient continued with exercises for AROM and strengthening bil hips. She continued with forward step ups bil, and has greater difficulty with Rt and tendency for hip to IR and adduct leading to increased knee valgus on Rt. She initiated lateral step-ups for abductor strengthening and was able to perform this with acceptable form on 4" step. Quadruped exercise initiated to attempt fire hydrant for hip ER strengthening and pt required assistance to lift Rt foot from table and reduce friction in order to initiate hip movement. Patient required removal of AFO and shoe to decreased weight for exercise. Patient was provided updated HEP today for hip strengthening with theraband as she requested exercises like this. She will continue to benefit from skilled PT interventions to address impairments and progress independence with mobility.    Personal Factors and Comorbidities  Age;Comorbidity 3+;Past/Current Experience;Time since onset of injury/illness/exacerbation    Comorbidities  see above    Examination-Activity Limitations  Locomotion Level;Stand;Sleep    Examination-Participation Restrictions  Community Activity;Yard Work    Stability/Clinical Decision Making  Stable/Uncomplicated    Rehab Potential  Good    PT Frequency  2x / week    PT Duration  6 weeks    PT Treatment/Interventions  ADLs/Self Care Home Management;Aquatic Therapy;Cryotherapy;Electrical Stimulation;Moist Heat;DME Instruction;Gait training;Stair training;Functional mobility training;Therapeutic activities;Therapeutic exercise;Balance training;Neuromuscular re-education;Patient/family education;Orthotic Fit/Training;Manual techniques;Wheelchair mobility training;Passive range of motion;Scar mobilization;Dry needling;Energy  conservation;Taping    PT Next Visit Plan  Continue BLE, functional strength training, endurance training, balance training; continue to address hip IR strength with paloff press in standing when tandem stance improves.    PT Home Exercise Plan  eval: bridging, STS; 6/25: supine clam RTB, sidelying reverse clams with husband's assistance; 7/7: bridge (push through heels cues), mini squat, wall squat, hip hikes on step, tandem stance with support surface close by if needed for any exercise; 7/22 - hip ext/abd with red tb at knees in standing    Consulted and Agree with Plan of Care  Patient       Patient will benefit from skilled therapeutic intervention in order to improve the following deficits and impairments:  Abnormal gait, Decreased activity tolerance, Decreased balance, Decreased endurance, Decreased mobility, Decreased strength, Difficulty walking, Hypomobility, Increased fascial restricitons, Increased muscle spasms, Impaired flexibility, Improper body mechanics, Pain  Visit Diagnosis: 1. Other symptoms and signs involving the musculoskeletal system   2. Other symptoms and signs involving the nervous system   3. Difficulty in walking, not elsewhere classified   4. Muscle weakness (generalized)        Problem List Patient Active Problem List   Diagnosis Date Noted  . Postmenopausal 02/24/2018  . Pain syndrome, chronic 08/06/2015  . Lower extremity pain, left 04/12/2015  . Osteoporosis 11/21/2014  . Breast cancer of upper-outer quadrant of left female breast ([place]) 07/11/2014  . Abscess of axilla, left 06/27/2011  . HYPERLIPIDEMIA 01/26/2008  . REFLEX SYMPATHETIC DYSTROPHY 01/26/2008  . NEUROPATHY 01/26/2008  . HEMORRHOIDS, INTERNAL 01/26/2008  . ESOPHAGEAL  STRICTURE 01/26/2008  . GERD 01/26/2008  . BARRETTS ESOPHAGUS 01/26/2008  . DIVERTICULOSIS, COLON 01/26/2008  . IRRITABLE BOWEL SYNDROME 01/26/2008  . SCOLIOSIS 01/26/2008  . HEPATITIS C, HX OF 01/26/2008  . MITRAL  VALVE PROLAPSE, HX OF 01/26/2008    Kipp Brood, PT, DPT, [facility] Physical Therapist with [facility]  02/17/2019 11:04 AM    [facility] [address] [place], [place], [address] Phone: [phone]   Fax:  [phone]  Name: Amanda Terrell MRN: [id number] Date of Birth: 02/22/1956

## 2019-02-23 ENCOUNTER — Other Ambulatory Visit: Payer: Self-pay

## 2019-02-23 ENCOUNTER — Ambulatory Visit (HOSPITAL_COMMUNITY): Payer: BC Managed Care – PPO | Admitting: Physical Therapy

## 2019-02-23 DIAGNOSIS — R29898 Other symptoms and signs involving the musculoskeletal system: Secondary | ICD-10-CM

## 2019-02-23 DIAGNOSIS — R29818 Other symptoms and signs involving the nervous system: Secondary | ICD-10-CM

## 2019-02-23 DIAGNOSIS — R262 Difficulty in walking, not elsewhere classified: Secondary | ICD-10-CM

## 2019-02-23 DIAGNOSIS — M6281 Muscle weakness (generalized): Secondary | ICD-10-CM | POA: Diagnosis not present

## 2019-02-23 NOTE — Therapy (Signed)
[name], [place], [address] Phone: [phone]   Fax:  [phone]  Physical Therapy Treatment  Patient Details  Name: Amanda Terrell MRN: [id number] Date of Birth: [birth year] Referring Provider (PT): Amanda Fellows, MD   Encounter Date: 02/23/2019  PT End of Session - 02/23/19 1222    Visit Number  9    Number of Visits  12    Date for PT Re-Evaluation  03/02/19   Minireassess 02/09/19   Authorization Type  BCBS Other    Authorization Time Period  01/19/19 - 03/02/19    Authorization - Visit Number  8    Authorization - Number of Visits  30   30 visit PT/OT combined   PT Start Time  1884    PT Stop Time  1120    PT Time Calculation (min)  45 min    Equipment Utilized During Treatment  Gait belt;Other (comment)   Rt AFO   Activity Tolerance  Patient tolerated treatment well    Behavior During Therapy  WFL for tasks assessed/performed       Past Medical History:  Diagnosis Date  . 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 Arthritis   . Axillary abscess    left  . Barrett's esophagus   . Blood transfusion without reported diagnosis    1989  . Breast cancer of upper-outer quadrant of left female breast ([facility]) 07/11/2014  . Cataract    both eyes in 2016  . Cellulitis and abscess of unspecified site   . Diverticulosis   . Esophageal stricture   . GERD (gastroesophageal reflux disease)   . Heart murmur   . Hepatitis C 1990   interferon treatment  . Hiatal hernia   . Hyperlipidemia   . IBS (irritable bowel syndrome)   . Internal hemorrhoids   . Mitral valve prolapse   . Neuropathy   . Osteoporosis   . Other specified disease of hair and hair follicles   . Personal history of radiation therapy 2016  . PONV (postoperative nausea and vomiting)   . Postmenopausal 02/24/2018  . 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 Reflex sympathetic dystrophy   . Rosacea   . Scoliosis    adhesive  arachnoditis-right side    Past Surgical History:  Procedure Laterality Date  . [place], 2006   due to scoliosis= total 5 back surgeries  . BREAST FIBROADENOMA SURGERY     bilateral  . BREAST LUMPECTOMY Left   . BREAST LUMPECTOMY WITH RADIOACTIVE SEED LOCALIZATION Left 08/05/2014   Procedure: LEFT PARTICAL MASTECTOMY WITH RADIOACTIVE SEED LOCALIZATION;  Surgeon: Fanny Skates, MD;  Location: [facility];  Service: General;  Laterality: Left;  . [place], 1980, 1984  . COLONOSCOPY  2009  . HYSTEROSCOPY    . INCISE AND DRAIN ABCESS     left axillary abscess  . SPINAL FUSION    . spinal surgeries    . TONSILLECTOMY    . UPPER GASTROINTESTINAL ENDOSCOPY      There were no vitals filed for this visit.                    [facility] Adult PT Treatment/Exercise - 02/23/19 0001      Knee/Hip Exercises: Machines for Strengthening   Cybex Leg Press  3X10 40#      Knee/Hip Exercises: Standing   Hip ADduction  Both;1 set;Limitations;15  reps    Hip ADduction Limitations  HEP review: Red theraband right above knees    Hip Abduction  Both;1 set;Limitations;15 reps    Abduction Limitations  HEP review: Red theraband right above knees    Lateral Step Up  Both;1 set;15 reps;Hand Hold: 2;Step Height: 4"    Lateral Step Up Limitations  eccentric control    Forward Step Up  Both;Limitations;15 reps;Hand Hold: 2    Forward Step Up Limitations  12" step    Step Down  Both;1 set;15 reps;Step Height: 4";Hand Hold: 1;Step Height: 8"    Functional Squat  15 reps    Functional Squat Limitations  Mini squat    Wall Squat  10 reps;10 seconds    Other Standing Knee Exercises  Hip hiking 2x15 reps each, with 4" step and UE assist               PT Short Term Goals - 01/19/19 1228      PT SHORT TERM GOAL #1   Title  Pt will have improved MMT by 1/2 grade throughout proximal hip mm in order to maximize gait and balance.    Time  3     Period  Weeks    Status  New    Target Date  02/09/19      PT SHORT TERM GOAL #2   Title  Pt will have improved 30sec chair rise test to 13x to demo improved functional strength and balance.    Time  3    Period  Weeks    Status  New      PT SHORT TERM GOAL #3   Title  Pt will have improved DGI score to 16/24 to demo improved dynamic balance with gait and reduce her risk for falling.    Time  3    Period  Weeks    Status  New        PT Long Term Goals - 01/19/19 1230      PT LONG TERM GOAL #1   Title  Pt will have improved MMT by 1 grade throughout proximal hip mm in order to further maximize gait and balance.    Time  6    Period  Weeks    Status  New    Target Date  03/02/19      PT LONG TERM GOAL #2   Title  Pt will have improved L SLS to 10sec without UE and improved R SLS to 3sec without UE in order to demo improved functional hip and core strength and to maximize her gait.    Time  6    Period  Weeks    Status  New      PT LONG TERM GOAL #3   Title  Pt will have improved 3MWT by 141ft with LRAD in order to demo improved functional tolerance to mobility and maximize her community access.    Time  6    Period  Weeks    Status  New      PT LONG TERM GOAL #4   Title  Pt will report being able to stand for 10 mins without UE support to demo improved BLE strength, endurance, and balance in order to allow her to stand and perform self-hygiene and prepare a small meal in the kitchen with greater ease.    Time  6    Period  Weeks    Status  New  Plan - 02/23/19 1223    Clinical Impression Statement  continued with LE therex to challenge strength, stability and control of LE mm.  Increased forward step up to 12" as patient reported this is the height of step she has up into her bedroom.  Pt able to complete with bil UE support.  Began foward step down with 1 UE assist working on eccentric control  pt with noted improvement in Rt LE control in correcting  IR and knee valgus.  pt with noted fatique at end of session, however no complaints or pain.    Personal Factors and Comorbidities  Age;Comorbidity 3+;Past/Current Experience;Time since onset of injury/illness/exacerbation    Comorbidities  see above    Examination-Activity Limitations  Locomotion Level;Stand;Sleep    Examination-Participation Restrictions  Community Activity;Yard Work    Stability/Clinical Decision Making  Stable/Uncomplicated    Rehab Potential  Good    PT Frequency  2x / week    PT Duration  6 weeks    PT Treatment/Interventions  ADLs/Self Care Home Management;Aquatic Therapy;Cryotherapy;Electrical Stimulation;Moist Heat;DME Instruction;Gait training;Stair training;Functional mobility training;Therapeutic activities;Therapeutic exercise;Balance training;Neuromuscular re-education;Patient/family education;Orthotic Fit/Training;Manual techniques;Wheelchair mobility training;Passive range of motion;Scar mobilization;Dry needling;Energy conservation;Taping    PT Next Visit Plan  Continue BLE, functional strength training, endurance training, balance training; continue to address hip IR strength with paloff press in standing when tandem stance improves.  Resume nustep at EOS next session.    PT Home Exercise Plan  eval: bridging, STS; 6/25: supine clam RTB, sidelying reverse clams with husband's assistance; 7/7: bridge (push through heels cues), mini squat, wall squat, hip hikes on step, tandem stance with support surface close by if needed for any exercise; 7/22 - hip ext/abd with red tb at knees in standing    Consulted and Agree with Plan of Care  Patient       Patient will benefit from skilled therapeutic intervention in order to improve the following deficits and impairments:  Abnormal gait, Decreased activity tolerance, Decreased balance, Decreased endurance, Decreased mobility, Decreased strength, Difficulty walking, Hypomobility, Increased fascial restricitons, Increased muscle  spasms, Impaired flexibility, Improper body mechanics, Pain  Visit Diagnosis: 1. Other symptoms and signs involving the nervous system   2. Difficulty in walking, not elsewhere classified   3. Other symptoms and signs involving the musculoskeletal system        Problem List Patient Active Problem List   Diagnosis Date Noted  . Postmenopausal 02/24/2018  . Pain syndrome, chronic 08/06/2015  . Lower extremity pain, left 04/12/2015  . Osteoporosis 11/21/2014  . Breast cancer of upper-outer quadrant of left female breast ([place] [facility]) 07/11/2014  . Abscess of axilla, left 06/27/2011  . HYPERLIPIDEMIA 01/26/2008  . REFLEX SYMPATHETIC DYSTROPHY 01/26/2008  . NEUROPATHY 01/26/2008  . HEMORRHOIDS, INTERNAL 01/26/2008  . ESOPHAGEAL STRICTURE 01/26/2008  . GERD 01/26/2008  . BARRETTS ESOPHAGUS 01/26/2008  . DIVERTICULOSIS, COLON 01/26/2008  . IRRITABLE BOWEL SYNDROME 01/26/2008  . SCOLIOSIS 01/26/2008  . HEPATITIS C, HX OF 01/26/2008  . MITRAL VALVE PROLAPSE, HX OF 01/26/2008   Teena Irani, PTA/CLT [id number]  Teena Irani 02/23/2019, 12:26 PM  [facility] [address] [place], [place], [address] Phone: [phone]   Fax:  [phone]  Name: Amanda Terrell MRN: [id number] Date of Birth: Aug 31, 1955

## 2019-02-25 ENCOUNTER — Ambulatory Visit (HOSPITAL_COMMUNITY): Payer: BC Managed Care – PPO | Admitting: Physical Therapy

## 2019-02-25 ENCOUNTER — Encounter (HOSPITAL_COMMUNITY): Payer: Self-pay | Admitting: Physical Therapy

## 2019-02-25 ENCOUNTER — Other Ambulatory Visit: Payer: Self-pay

## 2019-02-25 DIAGNOSIS — M6281 Muscle weakness (generalized): Secondary | ICD-10-CM | POA: Diagnosis not present

## 2019-02-25 DIAGNOSIS — R29898 Other symptoms and signs involving the musculoskeletal system: Secondary | ICD-10-CM

## 2019-02-25 DIAGNOSIS — R262 Difficulty in walking, not elsewhere classified: Secondary | ICD-10-CM

## 2019-02-25 DIAGNOSIS — R29818 Other symptoms and signs involving the nervous system: Secondary | ICD-10-CM

## 2019-02-25 NOTE — Therapy (Signed)
[facility] [address] [place], [place], [address] Phone: [phone]   Fax:  [phone]  Physical Therapy Treatment/Progress Note   Patient Details  Name: Amanda Terrell MRN: [id number] Date of Birth: 14-Jun-1956 Referring Provider (PT): Glenna Fellows    Encounter Date: 02/25/2019   Progress Note Reporting Period 01/19/19 to 02/25/19  See note below for Objective Data and Assessment of Progress/Goals.       PT End of Session - 02/25/19 1106    Visit Number  10    Number of Visits  22    Date for PT Re-Evaluation  04/08/19    Authorization Type  BCBS Other    Authorization Time Period  9/41/74 - 0/8/14; new cert 4/81-8/56    Authorization - Visit Number  9    Authorization - Number of Visits  30    PT Start Time  1017    PT Stop Time  1100    PT Time Calculation (min)  43 min    Equipment Utilized During Treatment  Gait belt    Activity Tolerance  Patient tolerated treatment well    Behavior During Therapy  WFL for tasks assessed/performed       Past Medical History:  Diagnosis Date  . 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 Arthritis   . Axillary abscess    left  . Barrett's esophagus   . Blood transfusion without reported diagnosis    1989  . Breast cancer of upper-outer quadrant of left female breast ([facility]) 07/11/2014  . Cataract    both eyes in 2016  . Cellulitis and abscess of unspecified site   . Diverticulosis   . Esophageal stricture   . GERD (gastroesophageal reflux disease)   . Heart murmur   . Hepatitis C 1990   interferon treatment  . Hiatal hernia   . Hyperlipidemia   . IBS (irritable bowel syndrome)   . Internal hemorrhoids   . Mitral valve prolapse   . Neuropathy   . Osteoporosis   . Other specified disease of hair and hair follicles   . Personal history of radiation therapy 2016  . PONV (postoperative nausea and vomiting)   . Postmenopausal 02/24/2018  . 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/05/14-10/04/14   Left  Breast DCIS  . Reflex sympathetic dystrophy   . Rosacea   . Scoliosis    adhesive arachnoditis-right side    Past Surgical History:  Procedure Laterality Date  . [place], 2006   due to scoliosis= total 5 back surgeries  . BREAST FIBROADENOMA SURGERY     bilateral  . BREAST LUMPECTOMY Left   . BREAST LUMPECTOMY WITH RADIOACTIVE SEED LOCALIZATION Left 08/05/2014   Procedure: LEFT PARTICAL MASTECTOMY WITH RADIOACTIVE SEED LOCALIZATION;  Surgeon: Fanny Skates, MD;  Location: [facility];  Service: General;  Laterality: Left;  . [place], 1980, 1984  . COLONOSCOPY  2009  . HYSTEROSCOPY    . INCISE AND DRAIN ABCESS     left axillary abscess  . SPINAL FUSION    . spinal surgeries    . TONSILLECTOMY    . UPPER GASTROINTESTINAL ENDOSCOPY      There were no vitals filed for this visit.  Subjective Assessment - 02/25/19 1018    Subjective  I feel like I'm getting stronger and able to do things that I couldn't before. I'm trying to stay on my feet as long as I can. Walking is hard  for me to do but I can do it with the canes. I have constant pain but that will never go away as its from nerve issues.    How long can you sit comfortably?  hurts a little due to sciatica    How long can you stand comfortably?  can stand a little better but still limited, standing more but still limited by weakness    How long can you walk comfortably?  less than a mile    Patient Stated Goals  improve strength and balnace    Currently in Pain?  Yes    Pain Score  7     Pain Location  Leg    Pain Orientation  Right    Pain Descriptors / Indicators  Burning;Sharp    Pain Type  Chronic pain    Pain Radiating Towards  follows sciatic track    Pain Onset  More than a month ago    Pain Frequency  Constant    Aggravating Factors   sitting, not taking Gabapentin    Pain Relieving Factors  Gabapentin, Motrin         [facility] PT Assessment - 02/25/19 0001       Assessment   Medical Diagnosis  R hip pain, failed back syndrome, RSD, balance/gait training     Referring Provider (PT)  Glenna Fellows     Onset Date/Surgical Date  --   June 2006   Next MD Visit  unsure     Prior Therapy  years ago       Precautions   Precautions  Fall      Restrictions   Weight Bearing Restrictions  No      Balance Screen   Has the patient fallen in the past 6 months  No    Has the patient had a decrease in activity level because of a fear of falling?   No    Is the patient reluctant to leave their home because of a fear of falling?   No      Prior Function   Level of Independence  Independent    Vocation  Full time employment    Vocation Requirements  helps with small business     Leisure  outdoor activities       Sit to Stand   Comments  30 second sit to stand = 12       Strength   Right Hip Flexion  3-/5    Right Hip Extension  2-/5    Right Hip ABduction  2/5    Left Hip Flexion  3/5    Left Hip Extension  2+/5    Left Hip ABduction  4+/5    Right Knee Flexion  4-/5    Right Knee Extension  5/5    Left Knee Flexion  4/5    Left Knee Extension  5/5    Right Ankle Dorsiflexion  0/5    Left Ankle Dorsiflexion  5/5      Ambulation/Gait   Ambulation Distance (Feet)  375 Feet    Assistive device  Straight cane   B     Static Standing Balance   Static Standing - Comment/# of Minutes  L 8 seconds, R 1 second       Dynamic Gait Index   Level Surface  Moderate Impairment    Change in Gait Speed  Mild Impairment    Gait with Horizontal Head Turns  Mild Impairment    Gait with Vertical  Head Turns  Mild Impairment    Gait and Pivot Turn  Mild Impairment    Step Over Obstacle  Mild Impairment    Step Around Obstacles  Mild Impairment    Steps  Mild Impairment    Total Score  15                           PT Education - 02/25/19 1106    Education Details  re-exam findings, POC moving forward, progress thus far and typical extended  time required to make significant objective changes with chronic neurological condition    Person(s) Educated  Patient    Methods  Explanation    Comprehension  Verbalized understanding       PT Short Term Goals - 02/25/19 1048      PT SHORT TERM GOAL #1   Title  Pt will have improved MMT by 1/2 grade throughout proximal hip mm in order to maximize gait and balance.    Baseline  7/30- muscles not affeccted by neuro syndrome improving in strength (flowsheets)    Time  3    Period  Weeks    Status  Achieved      PT SHORT TERM GOAL #2   Title  Pt will have improved 30sec chair rise test to 13x to demo improved functional strength and balance.    Baseline  7/30- 12    Time  3    Period  Weeks    Status  On-going      PT SHORT TERM GOAL #3   Title  Pt will have improved DGI score to 16/24 to demo improved dynamic balance with gait and reduce her risk for falling.    Baseline  7/30- 15/24    Time  3    Period  Weeks    Status  On-going        PT Long Term Goals - 02/25/19 1051      PT LONG TERM GOAL #1   Title  Pt will have improved MMT by 1 grade throughout proximal hip mm in order to further maximize gait and balance.    Baseline  7/30- flowsheets    Time  6    Period  Weeks    Status  On-going      PT LONG TERM GOAL #2   Title  Pt will have improved L SLS to 10sec without UE and improved R SLS to 3sec without UE in order to demo improved functional hip and core strength and to maximize her gait.    Baseline  7/30- 9 seconds L, R 1-2 seconds R    Time  6    Period  Weeks    Status  On-going      PT LONG TERM GOAL #3   Title  Patient to be 1551ft with B SPCs at self-selected pace without rest breaks or fatigue in order to improve ability to access trail heads and enjoy vacations with family    Baseline  7/30- 355ft today, 469ft at best and revised goal to distance rather than gait speed    Time  6    Period  Weeks    Status  Revised      PT LONG TERM GOAL #4    Title  Pt will report being able to stand for 10 mins without UE support to demo improved BLE strength, endurance, and balance in order to allow her to stand and perform  self-hygiene and prepare a small meal in the kitchen with greater ease.    Baseline  7/30- not tested today    Time  6    Period  Weeks    Status  On-going            Plan - 02/25/19 1108    Clinical Impression Statement  Re-assessment performed today. Ms Dockham appears to be making slow but steady progress with skilled PT services, and does show improvements in functional strength, general mobility, and balance. She continues to demonstrate significant and ongoing impairments in isolated muscle strength, functional balance skills, functional activity tolerance, and remains a high fall risk. She will continue to benefit from skilled PT services to continue addressing functional deficits and reduce fall risk moving forward.    Personal Factors and Comorbidities  Age;Comorbidity 3+;Past/Current Experience;Time since onset of injury/illness/exacerbation    Examination-Activity Limitations  Locomotion Level;Stand;Sleep    Examination-Participation Restrictions  Community Activity;Yard Work    Stability/Clinical Decision Making  Stable/Uncomplicated    Designer, jewellery  Low    Rehab Potential  Good    PT Frequency  2x / week    PT Duration  6 weeks    PT Treatment/Interventions  ADLs/Self Care Home Management;Aquatic Therapy;Cryotherapy;Electrical Stimulation;Moist Heat;DME Instruction;Gait training;Stair training;Functional mobility training;Therapeutic activities;Therapeutic exercise;Balance training;Neuromuscular re-education;Patient/family education;Orthotic Fit/Training;Manual techniques;Wheelchair mobility training;Passive range of motion;Scar mobilization;Dry needling;Energy conservation;Taping    PT Next Visit Plan  Continue BLE, functional strength training, endurance training, balance training; continue to  address hip IR strength with paloff press in standing when tandem stance improves.  Resume nustep at EOS next session.    PT Home Exercise Plan  eval: bridging, STS; 6/25: supine clam RTB, sidelying reverse clams with husband's assistance; 7/7: bridge (push through heels cues), mini squat, wall squat, hip hikes on step, tandem stance with support surface close by if needed for any exercise; 7/22 - hip ext/abd with red tb at knees in standing    Consulted and Agree with Plan of Care  Patient       Patient will benefit from skilled therapeutic intervention in order to improve the following deficits and impairments:  Abnormal gait, Decreased activity tolerance, Decreased balance, Decreased endurance, Decreased mobility, Decreased strength, Difficulty walking, Hypomobility, Increased fascial restricitons, Increased muscle spasms, Impaired flexibility, Improper body mechanics, Pain  Visit Diagnosis: 1. Other symptoms and signs involving the nervous system   2. Difficulty in walking, not elsewhere classified   3. Other symptoms and signs involving the musculoskeletal system   4. Muscle weakness (generalized)        Problem List Patient Active Problem List   Diagnosis Date Noted  . Postmenopausal 02/24/2018  . Pain syndrome, chronic 08/06/2015  . Lower extremity pain, left 04/12/2015  . Osteoporosis 11/21/2014  . Breast cancer of upper-outer quadrant of left female breast ([facility]) 07/11/2014  . Abscess of axilla, left 06/27/2011  . HYPERLIPIDEMIA 01/26/2008  . REFLEX SYMPATHETIC DYSTROPHY 01/26/2008  . NEUROPATHY 01/26/2008  . HEMORRHOIDS, INTERNAL 01/26/2008  . ESOPHAGEAL STRICTURE 01/26/2008  . GERD 01/26/2008  . BARRETTS ESOPHAGUS 01/26/2008  . DIVERTICULOSIS, COLON 01/26/2008  . IRRITABLE BOWEL SYNDROME 01/26/2008  . SCOLIOSIS 01/26/2008  . HEPATITIS C, HX OF 01/26/2008  . MITRAL VALVE PROLAPSE, HX OF 01/26/2008    Deniece Ree PT, DPT, CBIS  Supplemental Physical  Therapist [place]    Pager [phone] Acute Rehab Office [place], [place], [address] Phone:  [phone]   Fax:  [phone]  Name: Amanda Terrell MRN: [id number] Date of Birth: [birth year]

## 2019-03-02 ENCOUNTER — Other Ambulatory Visit: Payer: Self-pay

## 2019-03-02 ENCOUNTER — Encounter (HOSPITAL_COMMUNITY): Payer: Self-pay

## 2019-03-02 ENCOUNTER — Ambulatory Visit (HOSPITAL_COMMUNITY): Payer: BC Managed Care – PPO | Attending: Neurosurgery

## 2019-03-02 DIAGNOSIS — R29818 Other symptoms and signs involving the nervous system: Secondary | ICD-10-CM | POA: Insufficient documentation

## 2019-03-02 DIAGNOSIS — M6281 Muscle weakness (generalized): Secondary | ICD-10-CM | POA: Diagnosis present

## 2019-03-02 DIAGNOSIS — R262 Difficulty in walking, not elsewhere classified: Secondary | ICD-10-CM | POA: Insufficient documentation

## 2019-03-02 DIAGNOSIS — R29898 Other symptoms and signs involving the musculoskeletal system: Secondary | ICD-10-CM | POA: Diagnosis present

## 2019-03-02 NOTE — Therapy (Signed)
[place], [place], [address] Phone: [phone]   Fax:  [phone]  Physical Therapy Treatment  Patient Details  Name: Amanda Terrell MRN: [id number] Date of Birth: 07/26/1956 Referring Provider (PT): Glenna Fellows    Encounter Date: 03/02/2019  PT End of Session - 03/02/19 0927    Visit Number  11    Number of Visits  22    Date for PT Re-Evaluation  04/08/19    Authorization Type  BCBS Other    Authorization Time Period  6/96/29 - 11/28/82; new cert 1/32-4/40    Authorization - Visit Number  11    Authorization - Number of Visits  30    PT Start Time  0920    PT Stop Time  1010   5' on Nustep at EOS, not included with charges   PT Time Calculation (min)  50 min    Equipment Utilized During Treatment  Gait belt    Activity Tolerance  Patient tolerated treatment well    Behavior During Therapy  [facility] for tasks assessed/performed       Past Medical History:  Diagnosis Date  . 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 Arthritis   . Axillary abscess    left  . Barrett's esophagus   . Blood transfusion without reported diagnosis    1989  . Breast cancer of upper-outer quadrant of left female breast ([place]) 07/11/2014  . Cataract    both eyes in 2016  . Cellulitis and abscess of unspecified site   . Diverticulosis   . Esophageal stricture   . GERD (gastroesophageal reflux disease)   . Heart murmur   . Hepatitis C 1990   interferon treatment  . Hiatal hernia   . Hyperlipidemia   . IBS (irritable bowel syndrome)   . Internal hemorrhoids   . Mitral valve prolapse   . Neuropathy   . Osteoporosis   . Other specified disease of hair and hair follicles   . Personal history of radiation therapy 2016  . PONV (postoperative nausea and vomiting)   . Postmenopausal 02/24/2018  . 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 Reflex sympathetic dystrophy   . Rosacea   . Scoliosis    adhesive arachnoditis-right  side    Past Surgical History:  Procedure Laterality Date  . [place], 2006   due to scoliosis= total 5 back surgeries  . BREAST FIBROADENOMA SURGERY     bilateral  . BREAST LUMPECTOMY Left   . BREAST LUMPECTOMY WITH RADIOACTIVE SEED LOCALIZATION Left 08/05/2014   Procedure: LEFT PARTICAL MASTECTOMY WITH RADIOACTIVE SEED LOCALIZATION;  Surgeon: Fanny Skates, MD;  Location: [facility];  Service: General;  Laterality: Left;  . [place], 1980, 1984  . COLONOSCOPY  2009  . HYSTEROSCOPY    . INCISE AND DRAIN ABCESS     left axillary abscess  . SPINAL FUSION    . spinal surgeries    . TONSILLECTOMY    . UPPER GASTROINTESTINAL ENDOSCOPY      There were no vitals filed for this visit.  Subjective Assessment - 03/02/19 0924    Subjective  Pt stated she is constant nerve based pain down Rt LE, constant intense throbbing but is used to it since it has been going on for years.    Currently in Pain?  Yes    Pain Score  8     Pain  Location  Leg    Pain Orientation  Right    Pain Descriptors / Indicators  Throbbing;Burning    Pain Type  Chronic pain    Pain Onset  More than a month ago    Pain Frequency  Constant    Aggravating Factors   sitting, not taking Gabapentin    Pain Relieving Factors  Gabapentin, Motrin    Effect of Pain on Daily Activities  increase                       [facility] Adult PT Treatment/Exercise - 03/02/19 0001      Exercises   Exercises  Knee/Hip      Knee/Hip Exercises: Aerobic   Nustep  @EOS   5 minutes hills #2 level 2 LE's only      Knee/Hip Exercises: Machines for Strengthening   Cybex Leg Press  Rt 30#; BLE with ball between feet for alignment 50#      Knee/Hip Exercises: Standing   Lateral Step Up  Both;1 set;15 reps;Hand Hold: 2;Step Height: 4"    Lateral Step Up Limitations  eccentric control    Forward Step Up  Both;Limitations;15 reps;Hand Hold: 2    Forward Step Up Limitations   12" step    Step Down  Both;1 set;15 reps;Step Height: 4";Hand Hold: 1;Step Height: 8"    Step Down Limitations  8in     Functional Squat  15 reps    Functional Squat Limitations  Mini squat    SLS with Vectors  10X3" holds with 1 UE with Lt, 2 UE with Rt     Other Standing Knee Exercises  Hip hiking 2x15 reps each, with 4" step and UE assist    Other Standing Knee Exercises  Tandem stance Rt forward on airex               PT Short Term Goals - 02/25/19 1048      PT SHORT TERM GOAL #1   Title  Pt will have improved MMT by 1/2 grade throughout proximal hip mm in order to maximize gait and balance.    Baseline  7/30- muscles not affeccted by neuro syndrome improving in strength (flowsheets)    Time  3    Period  Weeks    Status  Achieved      PT SHORT TERM GOAL #2   Title  Pt will have improved 30sec chair rise test to 13x to demo improved functional strength and balance.    Baseline  7/30- 12    Time  3    Period  Weeks    Status  On-going      PT SHORT TERM GOAL #3   Title  Pt will have improved DGI score to 16/24 to demo improved dynamic balance with gait and reduce her risk for falling.    Baseline  7/30- 15/24    Time  3    Period  Weeks    Status  On-going        PT Long Term Goals - 02/25/19 1051      PT LONG TERM GOAL #1   Title  Pt will have improved MMT by 1 grade throughout proximal hip mm in order to further maximize gait and balance.    Baseline  7/30- flowsheets    Time  6    Period  Weeks    Status  On-going      PT LONG TERM GOAL #2   Title  Pt will have improved L SLS to 10sec without UE and improved R SLS to 3sec without UE in order to demo improved functional hip and core strength and to maximize her gait.    Baseline  7/30- 9 seconds L, R 1-2 seconds R    Time  6    Period  Weeks    Status  On-going      PT LONG TERM GOAL #3   Title  Patient to be 1568ft with B SPCs at self-selected pace without rest breaks or fatigue in order to improve  ability to access trail heads and enjoy vacations with family    Baseline  7/30- 318ft today, 424ft at best and revised goal to distance rather than gait speed    Time  6    Period  Weeks    Status  Revised      PT LONG TERM GOAL #4   Title  Pt will report being able to stand for 10 mins without UE support to demo improved BLE strength, endurance, and balance in order to allow her to stand and perform self-hygiene and prepare a small meal in the kitchen with greater ease.    Baseline  7/30- not tested today    Time  6    Period  Weeks    Status  On-going            Plan - 03/02/19 1135    Clinical Impression Statement  Continued wiht established POC for LE functional strengthening, stability and balance.  Began leg press wiht Rt LE only for LE strengthening wiht cueing to improve mechanics and reduce IR wiht task.  Continued with hip strengthening exercises wiht min cueing to reduce compensation with hip hiking and added resistance proximal thighs during minisquats to activate glut med.  Pt improving stability wiht balalnce activities with minimal HHA required with paloff exercise.  Resumed nustep at EOS, pt reports that assisted with nerve pain following.    Personal Factors and Comorbidities  Age;Comorbidity 3+;Past/Current Experience;Time since onset of injury/illness/exacerbation    Comorbidities  see above    Examination-Activity Limitations  Locomotion Level;Stand;Sleep    Examination-Participation Restrictions  Community Activity;Yard Work    Stability/Clinical Decision Making  Stable/Uncomplicated    Designer, jewellery  Low    Rehab Potential  Good    PT Frequency  2x / week    PT Duration  6 weeks    PT Treatment/Interventions  ADLs/Self Care Home Management;Aquatic Therapy;Cryotherapy;Electrical Stimulation;Moist Heat;DME Instruction;Gait training;Stair training;Functional mobility training;Therapeutic activities;Therapeutic exercise;Balance training;Neuromuscular  re-education;Patient/family education;Orthotic Fit/Training;Manual techniques;Wheelchair mobility training;Passive range of motion;Scar mobilization;Dry needling;Energy conservation;Taping    PT Next Visit Plan  Continue BLE, functional strength training, endurance training, balance training; continue to address hip IR strength with paloff press in standing when tandem stance improves.  Resume nustep at EOS next session.    PT Home Exercise Plan  eval: bridging, STS; 6/25: supine clam RTB, sidelying reverse clams with husband's assistance; 7/7: bridge (push through heels cues), mini squat, wall squat, hip hikes on step, tandem stance with support surface close by if needed for any exercise; 7/22 - hip ext/abd with red tb at knees in standing       Patient will benefit from skilled therapeutic intervention in order to improve the following deficits and impairments:  Abnormal gait, Decreased activity tolerance, Decreased balance, Decreased endurance, Decreased mobility, Decreased strength, Difficulty walking, Hypomobility, Increased fascial restricitons, Increased muscle spasms, Impaired flexibility, Improper body mechanics, Pain  Visit Diagnosis:  1. Difficulty in walking, not elsewhere classified   2. Other symptoms and signs involving the musculoskeletal system   3. Muscle weakness (generalized)   4. Other symptoms and signs involving the nervous system        Problem List Patient Active Problem List   Diagnosis Date Noted  . Postmenopausal 02/24/2018  . Pain syndrome, chronic 08/06/2015  . Lower extremity pain, left 04/12/2015  . Osteoporosis 11/21/2014  . Breast cancer of upper-outer quadrant of left female breast ([place]) 07/11/2014  . Abscess of axilla, left 06/27/2011  . HYPERLIPIDEMIA 01/26/2008  . REFLEX SYMPATHETIC DYSTROPHY 01/26/2008  . NEUROPATHY 01/26/2008  . HEMORRHOIDS, INTERNAL 01/26/2008  . ESOPHAGEAL STRICTURE 01/26/2008  . GERD 01/26/2008  . BARRETTS ESOPHAGUS  01/26/2008  . DIVERTICULOSIS, COLON 01/26/2008  . IRRITABLE BOWEL SYNDROME 01/26/2008  . SCOLIOSIS 01/26/2008  . HEPATITIS C, HX OF 01/26/2008  . MITRAL VALVE PROLAPSE, HX OF 01/26/2008   Amanda Terrell, [place]; [facility] [phone]  Amanda Terrell 03/02/2019, 11:52 AM  [place] [address] [place], [place], [address] Phone: [phone]   Fax:  [phone]  Name: Amanda Terrell MRN: [id number] Date of Birth: [birth year]

## 2019-03-04 ENCOUNTER — Encounter (HOSPITAL_COMMUNITY): Payer: Self-pay

## 2019-03-04 ENCOUNTER — Other Ambulatory Visit: Payer: Self-pay

## 2019-03-04 ENCOUNTER — Ambulatory Visit (HOSPITAL_COMMUNITY): Payer: BC Managed Care – PPO

## 2019-03-04 DIAGNOSIS — R29818 Other symptoms and signs involving the nervous system: Secondary | ICD-10-CM

## 2019-03-04 DIAGNOSIS — R262 Difficulty in walking, not elsewhere classified: Secondary | ICD-10-CM

## 2019-03-04 DIAGNOSIS — M6281 Muscle weakness (generalized): Secondary | ICD-10-CM

## 2019-03-04 DIAGNOSIS — R29898 Other symptoms and signs involving the musculoskeletal system: Secondary | ICD-10-CM

## 2019-03-04 NOTE — Therapy (Signed)
[place], [place], [address] Phone: [phone]   Fax:  [phone]  Physical Therapy Treatment  Patient Details  Name: Amanda Terrell MRN: [id number] Date of Birth: 24-Feb-1956 Referring Provider (PT): Glenna Fellows    Encounter Date: 03/04/2019  PT End of Session - 03/04/19 1209    Visit Number  12    Number of Visits  22    Date for PT Re-Evaluation  04/08/19    Authorization Type  BCBS Other    Authorization Time Period  10/11/15 - 12/28/58; new cert 7/37-1/06    Authorization - Visit Number  12    Authorization - Number of Visits  30    PT Start Time  0920    PT Stop Time  1010   5' on Nustep, not included with charges   PT Time Calculation (min)  50 min    Equipment Utilized During Treatment  Gait belt    Activity Tolerance  Patient tolerated treatment well    Behavior During Therapy  [facility] for tasks assessed/performed       Past Medical History:  Diagnosis Date  . 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 Arthritis   . Axillary abscess    left  . Barrett's esophagus   . Blood transfusion without reported diagnosis    1989  . Breast cancer of upper-outer quadrant of left female breast ([place]) 07/11/2014  . Cataract    both eyes in 2016  . Cellulitis and abscess of unspecified site   . Diverticulosis   . Esophageal stricture   . GERD (gastroesophageal reflux disease)   . Heart murmur   . Hepatitis C 1990   interferon treatment  . Hiatal hernia   . Hyperlipidemia   . IBS (irritable bowel syndrome)   . Internal hemorrhoids   . Mitral valve prolapse   . Neuropathy   . Osteoporosis   . Other specified disease of hair and hair follicles   . Personal history of radiation therapy 2016  . PONV (postoperative nausea and vomiting)   . Postmenopausal 02/24/2018  . 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 Reflex sympathetic dystrophy   . Rosacea   . Scoliosis    adhesive arachnoditis-right side     Past Surgical History:  Procedure Laterality Date  . [place], 2006   due to scoliosis= total 5 back surgeries  . BREAST FIBROADENOMA SURGERY     bilateral  . BREAST LUMPECTOMY Left   . BREAST LUMPECTOMY WITH RADIOACTIVE SEED LOCALIZATION Left 08/05/2014   Procedure: LEFT PARTICAL MASTECTOMY WITH RADIOACTIVE SEED LOCALIZATION;  Surgeon: Fanny Skates, MD;  Location: [facility];  Service: General;  Laterality: Left;  . [place], 1980, 1984  . COLONOSCOPY  2009  . HYSTEROSCOPY    . INCISE AND DRAIN ABCESS     left axillary abscess  . SPINAL FUSION    . spinal surgeries    . TONSILLECTOMY    . UPPER GASTROINTESTINAL ENDOSCOPY      There were no vitals filed for this visit.  Subjective Assessment - 03/04/19 0923    Subjective  Pt stated she has pain from lower back/hip down Rt LE ending at ankle, burning nerve based pain.    Patient Stated Goals  improve strength and balnace    Currently in Pain?  Yes    Pain Score  8     Pain  Location  Leg    Pain Orientation  Right    Pain Descriptors / Indicators  Burning;Throbbing    Pain Type  Chronic pain    Pain Radiating Towards  follows sciatric track    Pain Onset  More than a month ago    Pain Frequency  Constant    Aggravating Factors   sitting, not taking Gabapentin    Pain Relieving Factors  Gabapentin, Motrin    Effect of Pain on Daily Activities  increase                       [facility] Adult PT Treatment/Exercise - 03/04/19 0001      Exercises   Exercises  Knee/Hip      Knee/Hip Exercises: Aerobic   Nustep  @EOS   5 minutes hills #3 level 3 LE's only      Knee/Hip Exercises: Machines for Strengthening   Cybex Leg Press  50# BLE with ball between heels for alignment      Knee/Hip Exercises: Standing   Forward Step Up  Both;Limitations;15 reps;Hand Hold: 2    Forward Step Up Limitations  12" step    Step Down  Both;1 set;15 reps;Step Height: 4";Hand  Hold: 1;Step Height: 8"    Step Down Limitations  8in     Functional Squat  15 reps    Functional Squat Limitations  squat then walk around 12in step    Other Standing Knee Exercises  Hip hiking 2x15 reps each, with 4" step and UE assist; hip hike then abd x3" BLE 2x 10    Other Standing Knee Exercises  Tandem stance paloff 4x 15 on foam               PT Short Term Goals - 02/25/19 1048      PT SHORT TERM GOAL #1   Title  Pt will have improved MMT by 1/2 grade throughout proximal hip mm in order to maximize gait and balance.    Baseline  7/30- muscles not affeccted by neuro syndrome improving in strength (flowsheets)    Time  3    Period  Weeks    Status  Achieved      PT SHORT TERM GOAL #2   Title  Pt will have improved 30sec chair rise test to 13x to demo improved functional strength and balance.    Baseline  7/30- 12    Time  3    Period  Weeks    Status  On-going      PT SHORT TERM GOAL #3   Title  Pt will have improved DGI score to 16/24 to demo improved dynamic balance with gait and reduce her risk for falling.    Baseline  7/30- 15/24    Time  3    Period  Weeks    Status  On-going        PT Long Term Goals - 02/25/19 1051      PT LONG TERM GOAL #1   Title  Pt will have improved MMT by 1 grade throughout proximal hip mm in order to further maximize gait and balance.    Baseline  7/30- flowsheets    Time  6    Period  Weeks    Status  On-going      PT LONG TERM GOAL #2   Title  Pt will have improved L SLS to 10sec without UE and improved R SLS to 3sec without UE in order to  demo improved functional hip and core strength and to maximize her gait.    Baseline  7/30- 9 seconds L, R 1-2 seconds R    Time  6    Period  Weeks    Status  On-going      PT LONG TERM GOAL #3   Title  Patient to be 1561ft with B SPCs at self-selected pace without rest breaks or fatigue in order to improve ability to access trail heads and enjoy vacations with family    Baseline   7/30- 374ft today, 435ft at best and revised goal to distance rather than gait speed    Time  6    Period  Weeks    Status  Revised      PT LONG TERM GOAL #4   Title  Pt will report being able to stand for 10 mins without UE support to demo improved BLE strength, endurance, and balance in order to allow her to stand and perform self-hygiene and prepare a small meal in the kitchen with greater ease.    Baseline  7/30- not tested today    Time  6    Period  Weeks    Status  On-going            Plan - 03/04/19 1210    Clinical Impression Statement  Continued with established POC for functional strengthening, stability and balalnce.  Added squat walk around to improve IR with cueing for mechanics, used mirror to improve awareness of mechanics.  Pt improving mechanics with hip hiking wiht less cueing required.  Added standing isometric exercise for IR strengthening.  EOS with nustep, pt reports decrease in nerve pain following nustep.    Personal Factors and Comorbidities  Age;Comorbidity 3+;Past/Current Experience;Time since onset of injury/illness/exacerbation    Comorbidities  see above    Examination-Activity Limitations  Locomotion Level;Stand;Sleep    Examination-Participation Restrictions  Community Activity;Yard Work    Stability/Clinical Decision Making  Stable/Uncomplicated    Designer, jewellery  Low    Rehab Potential  Good    PT Frequency  2x / week    PT Duration  6 weeks    PT Treatment/Interventions  ADLs/Self Care Home Management;Aquatic Therapy;Cryotherapy;Electrical Stimulation;Moist Heat;DME Instruction;Gait training;Stair training;Functional mobility training;Therapeutic activities;Therapeutic exercise;Balance training;Neuromuscular re-education;Patient/family education;Orthotic Fit/Training;Manual techniques;Wheelchair mobility training;Passive range of motion;Scar mobilization;Dry needling;Energy conservation;Taping    PT Next Visit Plan  Continue BLE,  functional strength training, endurance training, balance training; continue to address hip IR strength with paloff press in standing when tandem stance improves.  Resume nustep at EOS next session.    PT Home Exercise Plan  eval: bridging, STS; 6/25: supine clam RTB, sidelying reverse clams with husband's assistance; 7/7: bridge (push through heels cues), mini squat, wall squat, hip hikes on step, tandem stance with support surface close by if needed for any exercise; 7/22 - hip ext/abd with red tb at knees in standing       Patient will benefit from skilled therapeutic intervention in order to improve the following deficits and impairments:  Abnormal gait, Decreased activity tolerance, Decreased balance, Decreased endurance, Decreased mobility, Decreased strength, Difficulty walking, Hypomobility, Increased fascial restricitons, Increased muscle spasms, Impaired flexibility, Improper body mechanics, Pain  Visit Diagnosis: 1. Muscle weakness (generalized)   2. Other symptoms and signs involving the nervous system   3. Other symptoms and signs involving the musculoskeletal system   4. Difficulty in walking, not elsewhere classified        Problem List Patient Active  Problem List   Diagnosis Date Noted  . Postmenopausal 02/24/2018  . Pain syndrome, chronic 08/06/2015  . Lower extremity pain, left 04/12/2015  . Osteoporosis 11/21/2014  . Breast cancer of upper-outer quadrant of left female breast ([place]) 07/11/2014  . Abscess of axilla, left 06/27/2011  . HYPERLIPIDEMIA 01/26/2008  . REFLEX SYMPATHETIC DYSTROPHY 01/26/2008  . NEUROPATHY 01/26/2008  . HEMORRHOIDS, INTERNAL 01/26/2008  . ESOPHAGEAL STRICTURE 01/26/2008  . GERD 01/26/2008  . BARRETTS ESOPHAGUS 01/26/2008  . DIVERTICULOSIS, COLON 01/26/2008  . IRRITABLE BOWEL SYNDROME 01/26/2008  . SCOLIOSIS 01/26/2008  . HEPATITIS C, HX OF 01/26/2008  . MITRAL VALVE PROLAPSE, HX OF 01/26/2008   Amanda Terrell, [place];  [place]  Aldona Lento 03/04/2019, 12:14 PM  [facility] [address] [place], [place], [address] Phone: [phone]   Fax:  [phone]  Name: Amanda Terrell MRN: [id number] Date of Birth: 1955/09/07

## 2019-03-05 ENCOUNTER — Other Ambulatory Visit (HOSPITAL_COMMUNITY): Payer: BLUE CROSS/BLUE SHIELD

## 2019-03-09 ENCOUNTER — Encounter (HOSPITAL_COMMUNITY): Payer: Self-pay

## 2019-03-09 ENCOUNTER — Other Ambulatory Visit: Payer: Self-pay

## 2019-03-09 ENCOUNTER — Ambulatory Visit (HOSPITAL_COMMUNITY): Payer: BC Managed Care – PPO

## 2019-03-09 DIAGNOSIS — R29818 Other symptoms and signs involving the nervous system: Secondary | ICD-10-CM

## 2019-03-09 DIAGNOSIS — R262 Difficulty in walking, not elsewhere classified: Secondary | ICD-10-CM

## 2019-03-09 DIAGNOSIS — R29898 Other symptoms and signs involving the musculoskeletal system: Secondary | ICD-10-CM

## 2019-03-09 DIAGNOSIS — M6281 Muscle weakness (generalized): Secondary | ICD-10-CM

## 2019-03-09 NOTE — Therapy (Signed)
[place], [place], [address] Phone: [phone]   Fax:  [phone]  Physical Therapy Treatment  Patient Details  Name: Amanda Terrell MRN: [id number] Date of Birth: 1956/04/07 Referring Provider (PT): Glenna Fellows    Encounter Date: 03/09/2019  PT End of Session - 03/09/19 0910    Visit Number  13    Number of Visits  22    Date for PT Re-Evaluation  04/08/19    Authorization Type  BCBS Other    Authorization Time Period  3/84/53 - 12/31/66; new cert 0/32-1/22    Authorization - Visit Number  13    Authorization - Number of Visits  30    PT Start Time  0819    PT Stop Time  0906   5' on Nustep, not included wiht charges   PT Time Calculation (min)  47 min    Equipment Utilized During Treatment  Gait belt    Activity Tolerance  Patient tolerated treatment well    Behavior During Therapy  [facility] for tasks assessed/performed       Past Medical History:  Diagnosis Date  . 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 Arthritis   . Axillary abscess    left  . Barrett's esophagus   . Blood transfusion without reported diagnosis    1989  . Breast cancer of upper-outer quadrant of left female breast ([place]) 07/11/2014  . Cataract    both eyes in 2016  . Cellulitis and abscess of unspecified site   . Diverticulosis   . Esophageal stricture   . GERD (gastroesophageal reflux disease)   . Heart murmur   . Hepatitis C 1990   interferon treatment  . Hiatal hernia   . Hyperlipidemia   . IBS (irritable bowel syndrome)   . Internal hemorrhoids   . Mitral valve prolapse   . Neuropathy   . Osteoporosis   . Other specified disease of hair and hair follicles   . Personal history of radiation therapy 2016  . PONV (postoperative nausea and vomiting)   . Postmenopausal 02/24/2018  . 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 Reflex sympathetic dystrophy   . Rosacea   . Scoliosis    adhesive arachnoditis-right side     Past Surgical History:  Procedure Laterality Date  . [place], 2006   due to scoliosis= total 5 back surgeries  . BREAST FIBROADENOMA SURGERY     bilateral  . BREAST LUMPECTOMY Left   . BREAST LUMPECTOMY WITH RADIOACTIVE SEED LOCALIZATION Left 08/05/2014   Procedure: LEFT PARTICAL MASTECTOMY WITH RADIOACTIVE SEED LOCALIZATION;  Surgeon: Fanny Skates, MD;  Location: [place];  Service: General;  Laterality: Left;  . [place], 1980, 1984  . COLONOSCOPY  2009  . HYSTEROSCOPY    . INCISE AND DRAIN ABCESS     left axillary abscess  . SPINAL FUSION    . spinal surgeries    . TONSILLECTOMY    . UPPER GASTROINTESTINAL ENDOSCOPY      There were no vitals filed for this visit.  Subjective Assessment - 03/09/19 0819    Subjective  Pt stated she had busy weekend, her daughter and 3 grandchildren came over, leave for beach Saturday.  Current pain scale 5/10 Rt LE burning    Patient Stated Goals  improve strength and balnace    Currently in Pain?  Yes    Pain Score  5     Pain Location  Leg    Pain Orientation  Right    Pain Descriptors / Indicators  Burning   nerve based pain   Pain Type  Chronic pain    Pain Radiating Towards  follows sciatric track    Pain Onset  More than a month ago    Pain Frequency  Constant    Aggravating Factors   sitting, not take Gabapentin    Pain Relieving Factors  Gabapentin, Motrin    Effect of Pain on Daily Activities  increase         [facility] PT Assessment - 03/09/19 0001      Assessment   Medical Diagnosis  R hip pain, failed back syndrome, RSD, balance/gait training     Referring Provider (PT)  Glenna Fellows     Onset Date/Surgical Date  --   June 2006   Next MD Visit  unsure     Prior Therapy  years ago                    [facility] Adult PT Treatment/Exercise - 03/09/19 0001      Exercises   Exercises  Knee/Hip      Knee/Hip Exercises: Aerobic   Nustep  @EOS   5 minutes hills  #3 level 3 LE's only      Knee/Hip Exercises: Machines for Strengthening   Cybex Leg Press  50# BLE with ball between heels for alignment      Knee/Hip Exercises: Standing   Forward Lunges  Both;15 reps;Limitations    Forward Lunges Limitations  2 finger holds onto 4" step working on control and reducing knee valgus    Forward Step Up  Both;Limitations;15 reps;Hand Hold: 2    Forward Step Up Limitations  12" step    Step Down  Both;1 set;15 reps;Step Height: 4";Hand Hold: 1;Step Height: 8"    Step Down Limitations  8in     Functional Squat  15 reps    Functional Squat Limitations  squat then walk around 12in step    Walking with Sports Cord  isometric IR with RTB 10x 5" Rt LE    Other Standing Knee Exercises  Hip hiking 2x15 reps each, with 4" step and UE assist; hip hike then abd x3" BLE 2x 10    Other Standing Knee Exercises  Sidestep 5RT inside // bars      Knee/Hip Exercises: Supine   Other Supine Knee/Hip Exercises  Clams with therapsit resistance IR and ER    Other Supine Knee/Hip Exercises  Sciatic nerve glide               PT Short Term Goals - 02/25/19 1048      PT SHORT TERM GOAL #1   Title  Pt will have improved MMT by 1/2 grade throughout proximal hip mm in order to maximize gait and balance.    Baseline  7/30- muscles not affeccted by neuro syndrome improving in strength (flowsheets)    Time  3    Period  Weeks    Status  Achieved      PT SHORT TERM GOAL #2   Title  Pt will have improved 30sec chair rise test to 13x to demo improved functional strength and balance.    Baseline  7/30- 12    Time  3    Period  Weeks    Status  On-going      PT SHORT TERM GOAL #3   Title  Pt will have improved DGI score to 16/24 to demo improved dynamic balance with gait and reduce her risk for falling.    Baseline  7/30- 15/24    Time  3    Period  Weeks    Status  On-going        PT Long Term Goals - 02/25/19 1051      PT LONG TERM GOAL #1   Title  Pt will have  improved MMT by 1 grade throughout proximal hip mm in order to further maximize gait and balance.    Baseline  7/30- flowsheets    Time  6    Period  Weeks    Status  On-going      PT LONG TERM GOAL #2   Title  Pt will have improved L SLS to 10sec without UE and improved R SLS to 3sec without UE in order to demo improved functional hip and core strength and to maximize her gait.    Baseline  7/30- 9 seconds L, R 1-2 seconds R    Time  6    Period  Weeks    Status  On-going      PT LONG TERM GOAL #3   Title  Patient to be 1569ft with B SPCs at self-selected pace without rest breaks or fatigue in order to improve ability to access trail heads and enjoy vacations with family    Baseline  7/30- 31ft today, 431ft at best and revised goal to distance rather than gait speed    Time  6    Period  Weeks    Status  Revised      PT LONG TERM GOAL #4   Title  Pt will report being able to stand for 10 mins without UE support to demo improved BLE strength, endurance, and balance in order to allow her to stand and perform self-hygiene and prepare a small meal in the kitchen with greater ease.    Baseline  7/30- not tested today    Time  6    Period  Weeks    Status  On-going            Plan - 03/09/19 1131    Clinical Impression Statement  Continued with established POC for functional strengthening, hip stability and balance training,  Pt continues to require AAROM with Rt glut med and ER due to weakness.  Continued wiht isometric strengthening exercises with therapist resistance and cueing to reduce compensation.  Added sciatic nerve glides to reduce burning sensation Rt LE.  EOS iwth nustep, pt reports decreased nerve based symptoms at EOS>    Personal Factors and Comorbidities  Age;Comorbidity 3+;Past/Current Experience;Time since onset of injury/illness/exacerbation    Comorbidities  see above    Examination-Activity Limitations  Locomotion Level;Stand;Sleep    Examination-Participation  Restrictions  Community Activity;Yard Work    Stability/Clinical Decision Making  Stable/Uncomplicated    Designer, jewellery  Low    Rehab Potential  Good    PT Frequency  2x / week    PT Duration  6 weeks    PT Treatment/Interventions  ADLs/Self Care Home Management;Aquatic Therapy;Cryotherapy;Electrical Stimulation;Moist Heat;DME Instruction;Gait training;Stair training;Functional mobility training;Therapeutic activities;Therapeutic exercise;Balance training;Neuromuscular re-education;Patient/family education;Orthotic Fit/Training;Manual techniques;Wheelchair mobility training;Passive range of motion;Scar mobilization;Dry needling;Energy conservation;Taping    PT Next Visit Plan  Continue BLE, functional strength training, endurance training, balance training; continue to address hip IR strength with paloff press in standing when tandem stance improves.  Resume nustep at EOS next session.  PT Home Exercise Plan  eval: bridging, STS; 6/25: supine clam RTB, sidelying reverse clams with husband's assistance; 7/7: bridge (push through heels cues), mini squat, wall squat, hip hikes on step, tandem stance with support surface close by if needed for any exercise; 7/22 - hip ext/abd with red tb at knees in standing       Patient will benefit from skilled therapeutic intervention in order to improve the following deficits and impairments:  Abnormal gait, Decreased activity tolerance, Decreased balance, Decreased endurance, Decreased mobility, Decreased strength, Difficulty walking, Hypomobility, Increased fascial restricitons, Increased muscle spasms, Impaired flexibility, Improper body mechanics, Pain  Visit Diagnosis: 1. Other symptoms and signs involving the nervous system   2. Other symptoms and signs involving the musculoskeletal system   3. Difficulty in walking, not elsewhere classified   4. Muscle weakness (generalized)        Problem List Patient Active Problem List   Diagnosis  Date Noted  . Postmenopausal 02/24/2018  . Pain syndrome, chronic 08/06/2015  . Lower extremity pain, left 04/12/2015  . Osteoporosis 11/21/2014  . Breast cancer of upper-outer quadrant of left female breast ([place]) 07/11/2014  . Abscess of axilla, left 06/27/2011  . HYPERLIPIDEMIA 01/26/2008  . REFLEX SYMPATHETIC DYSTROPHY 01/26/2008  . NEUROPATHY 01/26/2008  . HEMORRHOIDS, INTERNAL 01/26/2008  . ESOPHAGEAL STRICTURE 01/26/2008  . GERD 01/26/2008  . BARRETTS ESOPHAGUS 01/26/2008  . DIVERTICULOSIS, COLON 01/26/2008  . IRRITABLE BOWEL SYNDROME 01/26/2008  . SCOLIOSIS 01/26/2008  . HEPATITIS C, HX OF 01/26/2008  . MITRAL VALVE PROLAPSE, HX OF 01/26/2008   Ihor Austin, [place]; [place]  Aldona Lento 03/09/2019, 12:54 PM  [facility] [address] [place], [place], [address] Phone: [phone]   Fax:  [phone]  Name: Amanda Terrell MRN: [id number] Date of Birth: 09/23/1955

## 2019-03-09 NOTE — Patient Instructions (Signed)
Access Code: [email or web address]  URL: [email or web address]  Date: 03/09/2019  Prepared by: Ihor Austin   Exercises Supine Sciatic Nerve Glide - 10 reps - 3 sets - 1x daily - 7x weekly

## 2019-03-11 ENCOUNTER — Other Ambulatory Visit: Payer: Self-pay

## 2019-03-11 ENCOUNTER — Ambulatory Visit (HOSPITAL_COMMUNITY): Payer: BC Managed Care – PPO | Admitting: Physical Therapy

## 2019-03-11 ENCOUNTER — Encounter (HOSPITAL_COMMUNITY): Payer: Self-pay | Admitting: Physical Therapy

## 2019-03-11 DIAGNOSIS — R262 Difficulty in walking, not elsewhere classified: Secondary | ICD-10-CM | POA: Diagnosis not present

## 2019-03-11 DIAGNOSIS — M6281 Muscle weakness (generalized): Secondary | ICD-10-CM

## 2019-03-11 DIAGNOSIS — R29818 Other symptoms and signs involving the nervous system: Secondary | ICD-10-CM

## 2019-03-11 DIAGNOSIS — R29898 Other symptoms and signs involving the musculoskeletal system: Secondary | ICD-10-CM

## 2019-03-11 NOTE — Therapy (Signed)
[place], [place], [address] Phone: [phone]   Fax:  [phone]  Physical Therapy Treatment  Patient Details  Name: Amanda Terrell MRN: [id number] Date of Birth: 01/11/1956 Referring Provider (PT): Glenna Fellows    Encounter Date: 03/11/2019  PT End of Session - 03/11/19 0842    Visit Number  14    Number of Visits  22    Date for PT Re-Evaluation  04/08/19    Authorization Type  BCBS Other    Authorization Time Period  4/96/75 - 03/29/62; new cert 8/46-6/59    Authorization - Visit Number  14    Authorization - Number of Visits  30    PT Start Time  0835    PT Stop Time  0920    PT Time Calculation (min)  45 min    Equipment Utilized During Treatment  Gait belt    Activity Tolerance  Patient tolerated treatment well    Behavior During Therapy  [facility] for tasks assessed/performed       Past Medical History:  Diagnosis Date  . 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 Arthritis   . Axillary abscess    left  . Barrett's esophagus   . Blood transfusion without reported diagnosis    1989  . Breast cancer of upper-outer quadrant of left female breast ([place]) 07/11/2014  . Cataract    both eyes in 2016  . Cellulitis and abscess of unspecified site   . Diverticulosis   . Esophageal stricture   . GERD (gastroesophageal reflux disease)   . Heart murmur   . Hepatitis C 1990   interferon treatment  . Hiatal hernia   . Hyperlipidemia   . IBS (irritable bowel syndrome)   . Internal hemorrhoids   . Mitral valve prolapse   . Neuropathy   . Osteoporosis   . Other specified disease of hair and hair follicles   . Personal history of radiation therapy 2016  . PONV (postoperative nausea and vomiting)   . Postmenopausal 02/24/2018  . 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 Reflex sympathetic dystrophy   . Rosacea   . Scoliosis    adhesive arachnoditis-right side    Past Surgical History:  Procedure  Laterality Date  . [place], 2006   due to scoliosis= total 5 back surgeries  . BREAST FIBROADENOMA SURGERY     bilateral  . BREAST LUMPECTOMY Left   . BREAST LUMPECTOMY WITH RADIOACTIVE SEED LOCALIZATION Left 08/05/2014   Procedure: LEFT PARTICAL MASTECTOMY WITH RADIOACTIVE SEED LOCALIZATION;  Surgeon: Fanny Skates, MD;  Location: [facility];  Service: General;  Laterality: Left;  . [place], 1980, 1984  . COLONOSCOPY  2009  . HYSTEROSCOPY    . INCISE AND DRAIN ABCESS     left axillary abscess  . SPINAL FUSION    . spinal surgeries    . TONSILLECTOMY    . UPPER GASTROINTESTINAL ENDOSCOPY      There were no vitals filed for this visit.  Subjective Assessment - 03/11/19 0838    Subjective  Patient reported that she is having pain in her right lower extremity as usual which she rates as an 8/10.    Patient Stated Goals  improve strength and balnace    Currently in Pain?  Yes    Pain Score  8     Pain Location  Leg  Pain Orientation  Right    Pain Descriptors / Indicators  Burning    Pain Type  Chronic pain    Pain Onset  More than a month ago                       [facility] Adult PT Treatment/Exercise - 03/11/19 0001      Knee/Hip Exercises: Aerobic   Nustep  @EOS   5 minutes hills #3 level 4 LE's only      Knee/Hip Exercises: Standing   Forward Lunges  Both;15 reps;Limitations    Forward Lunges Limitations  2 finger holds onto 4" step working on control and reducing knee valgus    Forward Step Up  Both;Limitations;15 reps;Hand Hold: 2    Forward Step Up Limitations  12" step    Step Down  Both;1 set;15 reps;Hand Hold: 1;Step Height: 8"    Step Down Limitations  8in     Other Standing Knee Exercises  Hip hiking 2x15 reps each, with 4" step and UE assist; hip hike then abd x3" BLE 2x 10    Other Standing Knee Exercises  Sidestep 5RT inside // bars      Knee/Hip Exercises: Seated   Other Seated Knee/Hip  Exercises  Marching over 6'' hurdle 2x10 each LE               PT Short Term Goals - 02/25/19 1048      PT SHORT TERM GOAL #1   Title  Pt will have improved MMT by 1/2 grade throughout proximal hip mm in order to maximize gait and balance.    Baseline  7/30- muscles not affeccted by neuro syndrome improving in strength (flowsheets)    Time  3    Period  Weeks    Status  Achieved      PT SHORT TERM GOAL #2   Title  Pt will have improved 30sec chair rise test to 13x to demo improved functional strength and balance.    Baseline  7/30- 12    Time  3    Period  Weeks    Status  On-going      PT SHORT TERM GOAL #3   Title  Pt will have improved DGI score to 16/24 to demo improved dynamic balance with gait and reduce her risk for falling.    Baseline  7/30- 15/24    Time  3    Period  Weeks    Status  On-going        PT Long Term Goals - 02/25/19 1051      PT LONG TERM GOAL #1   Title  Pt will have improved MMT by 1 grade throughout proximal hip mm in order to further maximize gait and balance.    Baseline  7/30- flowsheets    Time  6    Period  Weeks    Status  On-going      PT LONG TERM GOAL #2   Title  Pt will have improved L SLS to 10sec without UE and improved R SLS to 3sec without UE in order to demo improved functional hip and core strength and to maximize her gait.    Baseline  7/30- 9 seconds L, R 1-2 seconds R    Time  6    Period  Weeks    Status  On-going      PT LONG TERM GOAL #3   Title  Patient to be 1543ft with B SPCs  at self-selected pace without rest breaks or fatigue in order to improve ability to access trail heads and enjoy vacations with family    Baseline  7/30- 334ft today, 480ft at best and revised goal to distance rather than gait speed    Time  6    Period  Weeks    Status  Revised      PT LONG TERM GOAL #4   Title  Pt will report being able to stand for 10 mins without UE support to demo improved BLE strength, endurance, and balance  in order to allow her to stand and perform self-hygiene and prepare a small meal in the kitchen with greater ease.    Baseline  7/30- not tested today    Time  6    Period  Weeks    Status  On-going            Plan - 03/11/19 0931    Clinical Impression Statement  Continued with established plan of care this session. This session added hip flexion marching over 6'' hurdle in sitting. This session also progressed Nustep to level 4 although did not include Nustep in billed time. Patient with continued noted deviations with step downs with noted turn out of the lower extremity to control descent. Patient would benefit from continued skilled physical therapy in order to continue progressing towards functional goals.    Personal Factors and Comorbidities  Age;Comorbidity 3+;Past/Current Experience;Time since onset of injury/illness/exacerbation    Comorbidities  see above    Examination-Activity Limitations  Locomotion Level;Stand;Sleep    Examination-Participation Restrictions  Community Activity;Yard Work    Stability/Clinical Decision Making  Stable/Uncomplicated    Rehab Potential  Good    PT Frequency  2x / week    PT Duration  6 weeks    PT Treatment/Interventions  ADLs/Self Care Home Management;Aquatic Therapy;Cryotherapy;Electrical Stimulation;Moist Heat;DME Instruction;Gait training;Stair training;Functional mobility training;Therapeutic activities;Therapeutic exercise;Balance training;Neuromuscular re-education;Patient/family education;Orthotic Fit/Training;Manual techniques;Wheelchair mobility training;Passive range of motion;Scar mobilization;Dry needling;Energy conservation;Taping    PT Next Visit Plan  Continue BLE, functional strength training, endurance training, balance training; continue to address hip IR strength with paloff press in standing when tandem stance improves.  Resume nustep at EOS next session.    PT Home Exercise Plan  eval: bridging, STS; 6/25: supine clam RTB,  sidelying reverse clams with husband's assistance; 7/7: bridge (push through heels cues), mini squat, wall squat, hip hikes on step, tandem stance with support surface close by if needed for any exercise; 7/22 - hip ext/abd with red tb at knees in standing       Patient will benefit from skilled therapeutic intervention in order to improve the following deficits and impairments:  Abnormal gait, Decreased activity tolerance, Decreased balance, Decreased endurance, Decreased mobility, Decreased strength, Difficulty walking, Hypomobility, Increased fascial restricitons, Increased muscle spasms, Impaired flexibility, Improper body mechanics, Pain  Visit Diagnosis: 1. Muscle weakness (generalized)   2. Difficulty in walking, not elsewhere classified   3. Other symptoms and signs involving the musculoskeletal system   4. Other symptoms and signs involving the nervous system        Problem List Patient Active Problem List   Diagnosis Date Noted  . Postmenopausal 02/24/2018  . Pain syndrome, chronic 08/06/2015  . Lower extremity pain, left 04/12/2015  . Osteoporosis 11/21/2014  . Breast cancer of upper-outer quadrant of left female breast ([place]) 07/11/2014  . Abscess of axilla, left 06/27/2011  . HYPERLIPIDEMIA 01/26/2008  . REFLEX SYMPATHETIC DYSTROPHY 01/26/2008  . NEUROPATHY 01/26/2008  .  HEMORRHOIDS, INTERNAL 01/26/2008  . ESOPHAGEAL STRICTURE 01/26/2008  . GERD 01/26/2008  . BARRETTS ESOPHAGUS 01/26/2008  . DIVERTICULOSIS, COLON 01/26/2008  . IRRITABLE BOWEL SYNDROME 01/26/2008  . SCOLIOSIS 01/26/2008  . HEPATITIS C, HX OF 01/26/2008  . MITRAL VALVE PROLAPSE, HX OF 01/26/2008   Clarene Critchley PT, DPT 9:33 AM, 03/11/19 [place], [place], [address] Phone: [phone]   Fax:  [phone]  Name: Amanda Terrell MRN: [id number] Date of Birth: 23-Mar-1956

## 2019-03-12 ENCOUNTER — Ambulatory Visit (HOSPITAL_COMMUNITY): Payer: BLUE CROSS/BLUE SHIELD

## 2019-03-12 ENCOUNTER — Ambulatory Visit (HOSPITAL_COMMUNITY): Payer: BLUE CROSS/BLUE SHIELD | Admitting: Hematology

## 2019-03-23 ENCOUNTER — Other Ambulatory Visit: Payer: Self-pay

## 2019-03-23 ENCOUNTER — Ambulatory Visit (HOSPITAL_COMMUNITY): Payer: BC Managed Care – PPO | Admitting: Physical Therapy

## 2019-03-23 ENCOUNTER — Encounter (HOSPITAL_COMMUNITY): Payer: Self-pay | Admitting: Physical Therapy

## 2019-03-23 DIAGNOSIS — R29898 Other symptoms and signs involving the musculoskeletal system: Secondary | ICD-10-CM

## 2019-03-23 DIAGNOSIS — M6281 Muscle weakness (generalized): Secondary | ICD-10-CM

## 2019-03-23 DIAGNOSIS — R262 Difficulty in walking, not elsewhere classified: Secondary | ICD-10-CM

## 2019-03-23 DIAGNOSIS — R29818 Other symptoms and signs involving the nervous system: Secondary | ICD-10-CM

## 2019-03-23 NOTE — Therapy (Signed)
[place], [place], [address] Phone: [phone]   Fax:  [phone]  Physical Therapy Treatment  Patient Details  Name: Amanda Terrell MRN: [id number] Date of Birth: 11-09-1955 Referring Provider (PT): Amanda Terrell    Encounter Date: 03/23/2019  PT End of Session - 03/23/19 1215    Visit Number  15    Number of Visits  22    Date for PT Re-Evaluation  04/08/19    Authorization Type  BCBS Other    Authorization Time Period  [id number] - [id number]; new cert [id number]    Authorization - Visit Number  15    Authorization - Number of Visits  30    PT Start Time  [id number]    PT Stop Time  1155    PT Time Calculation (min)  39 min    Activity Tolerance  Patient tolerated treatment well    Behavior During Therapy  [facility] for tasks assessed/performed       Past Medical History:  Diagnosis Date  . 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 Arthritis   . Axillary abscess    left  . Barrett's esophagus   . Blood transfusion without reported diagnosis    1989  . Breast cancer of upper-outer quadrant of left female breast ([place]) 07/11/2014  . Cataract    both eyes in 2016  . Cellulitis and abscess of unspecified site   . Diverticulosis   . Esophageal stricture   . GERD (gastroesophageal reflux disease)   . Heart murmur   . Hepatitis C 1990   interferon treatment  . Hiatal hernia   . Hyperlipidemia   . IBS (irritable bowel syndrome)   . Internal hemorrhoids   . Mitral valve prolapse   . Neuropathy   . Osteoporosis   . Other specified disease of hair and hair follicles   . Personal history of radiation therapy 2016  . PONV (postoperative nausea and vomiting)   . Postmenopausal 02/24/2018  . 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 Reflex sympathetic dystrophy   . Rosacea   . Scoliosis    adhesive arachnoditis-right side    Past Surgical History:  Procedure Laterality Date  . [place], 2006    due to scoliosis= total 5 back surgeries  . BREAST FIBROADENOMA SURGERY     bilateral  . BREAST LUMPECTOMY Left   . BREAST LUMPECTOMY WITH RADIOACTIVE SEED LOCALIZATION Left 08/05/2014   Procedure: LEFT PARTICAL MASTECTOMY WITH RADIOACTIVE SEED LOCALIZATION;  Surgeon: Amanda Skates, MD;  Location: [place];  Service: General;  Laterality: Left;  . [place], 1980, 1984  . COLONOSCOPY  2009  . HYSTEROSCOPY    . INCISE AND DRAIN ABCESS     left axillary abscess  . SPINAL FUSION    . spinal surgeries    . TONSILLECTOMY    . UPPER GASTROINTESTINAL ENDOSCOPY      There were no vitals filed for this visit.  Subjective Assessment - 03/23/19 1120    Subjective  Nothing changes as far  as pain and everything but I deal with it. Sometimes especially wearing the mask its harder for me to see the ground so I end up partially crouching.    Currently in Pain?  Yes    Pain Score  8     Pain Location  Leg    Pain Orientation  Right  [place] Adult PT Treatment/Exercise - 03/23/19 0001      Knee/Hip Exercises: Standing   Lateral Step Up  Both;1 set;15 reps;Hand Hold: 2;Step Height: 8"    Forward Step Up  Both;Limitations;15 reps;Hand Hold: 2    Forward Step Up Limitations  12" step    Step Down Limitations  attempted, unsafe given torsion on knee       Knee/Hip Exercises: Seated   Sit to Sand  20 reps;with UE support   eccentric stand to sit      Knee/Hip Exercises: Supine   Bridges  Both;1 set;15 reps    Bridges Limitations  3 second holds     Other Supine Knee/Hip Exercises  R LE leg presses with PT maintaning good position of LE 1x15             PT Education - 03/23/19 1214    Education Details  reasoning for modifying eccentric strength exercises, risk of knee injury with combination of significant ER of tibia and knee flexion, encouraged her to call [employer] regarding appointments for water exercise programs     Person(s) Educated  Patient    Methods  Explanation    Comprehension  Verbalized understanding       PT Short Term Goals - 02/25/19 1048      PT SHORT TERM GOAL #1   Title  Pt will have improved MMT by 1/2 grade throughout proximal hip mm in order to maximize gait and balance.    Baseline  7/30- muscles not affeccted by neuro syndrome improving in strength (flowsheets)    Time  3    Period  Weeks    Status  Achieved      PT SHORT TERM GOAL #2   Title  Pt will have improved 30sec chair rise test to 13x to demo improved functional strength and balance.    Baseline  7/30- 12    Time  3    Period  Weeks    Status  On-going      PT SHORT TERM GOAL #3   Title  Pt will have improved DGI score to 16/24 to demo improved dynamic balance with gait and reduce her risk for falling.    Baseline  7/30- 15/24    Time  3    Period  Weeks    Status  On-going        PT Long Term Goals - 02/25/19 1051      PT LONG TERM GOAL #1   Title  Pt will have improved MMT by 1 grade throughout proximal hip mm in order to further maximize gait and balance.    Baseline  7/30- flowsheets    Time  6    Period  Weeks    Status  On-going      PT LONG TERM GOAL #2   Title  Pt will have improved L SLS to 10sec without UE and improved R SLS to 3sec without UE in order to demo improved functional hip and core strength and to maximize her gait.    Baseline  7/30- 9 seconds L, R 1-2 seconds R    Time  6    Period  Weeks    Status  On-going      PT LONG TERM GOAL #3   Title  Patient to be 1535ft with B SPCs at self-selected pace without rest breaks or fatigue in order to improve ability to access trail heads and enjoy vacations with family  Baseline  7/30- 37ft today, 437ft at best and revised goal to distance rather than gait speed    Time  6    Period  Weeks    Status  Revised      PT LONG TERM GOAL #4   Title  Pt will report being able to stand for 10 mins without UE support to demo improved BLE  strength, endurance, and balance in order to allow her to stand and perform self-hygiene and prepare a small meal in the kitchen with greater ease.    Baseline  7/30- not tested today    Time  6    Period  Weeks    Status  On-going            Plan - 03/23/19 1215    Clinical Impression Statement  Amanda Terrell arrives today doing well, continuing to be motivated to participate in further strength and balance training. Continued functional strengthening as tolerated, however noted severe R LE torsion when performing step downs from 8, 6, and 4 inch steps; feel that this torsion could put patient at high risk for knee ligament injury and recommend avoiding this exercise with patient and addressing eccentric strength in alternative ways- may also need to advise modifying how she is descending steps at home if this torsion does not improve as she continues to get stronger. Finished session with Nustep level 3, hills 3, LEs only, not included in billing.    Personal Factors and Comorbidities  Age;Comorbidity 3+;Past/Current Experience;Time since onset of injury/illness/exacerbation    Comorbidities  see above    Examination-Activity Limitations  Locomotion Level;Stand;Sleep    Examination-Participation Restrictions  Community Activity;Yard Work    Stability/Clinical Decision Making  Stable/Uncomplicated    Designer, jewellery  Low    Rehab Potential  Good    PT Frequency  2x / week    PT Duration  6 weeks    PT Treatment/Interventions  ADLs/Self Care Home Management;Aquatic Therapy;Cryotherapy;Electrical Stimulation;Moist Heat;DME Instruction;Gait training;Stair training;Functional mobility training;Therapeutic activities;Therapeutic exercise;Balance training;Neuromuscular re-education;Patient/family education;Orthotic Fit/Training;Manual techniques;Wheelchair mobility training;Passive range of motion;Scar mobilization;Dry needling;Energy conservation;Taping    PT Next Visit Plan  avoid step  downs off of steps due to high levels of tibial ER and knee flexion putting ligaments/meniscus at risk. Address eccentric strength in other ways.  Strength, endurance, balance. Continue progressing Nustep.    PT Home Exercise Plan  eval: bridging, STS; 6/25: supine clam RTB, sidelying reverse clams with husband's assistance; 7/7: bridge (push through heels cues), mini squat, wall squat, hip hikes on step, tandem stance with support surface close by if needed for any exercise; 7/22 - hip ext/abd with red tb at knees in standing    Consulted and Agree with Plan of Care  Patient       Patient will benefit from skilled therapeutic intervention in order to improve the following deficits and impairments:  Abnormal gait, Decreased activity tolerance, Decreased balance, Decreased endurance, Decreased mobility, Decreased strength, Difficulty walking, Hypomobility, Increased fascial restricitons, Increased muscle spasms, Impaired flexibility, Improper body mechanics, Pain  Visit Diagnosis: Muscle weakness (generalized)  Difficulty in walking, not elsewhere classified  Other symptoms and signs involving the musculoskeletal system  Other symptoms and signs involving the nervous system     Problem List Patient Active Problem List   Diagnosis Date Noted  . Postmenopausal 02/24/2018  . Pain syndrome, chronic 08/06/2015  . Lower extremity pain, left 04/12/2015  . Osteoporosis 11/21/2014  . Breast cancer of upper-outer quadrant of left female  breast ([place]) 07/11/2014  . Abscess of axilla, left 06/27/2011  . HYPERLIPIDEMIA 01/26/2008  . REFLEX SYMPATHETIC DYSTROPHY 01/26/2008  . NEUROPATHY 01/26/2008  . HEMORRHOIDS, INTERNAL 01/26/2008  . ESOPHAGEAL STRICTURE 01/26/2008  . GERD 01/26/2008  . BARRETTS ESOPHAGUS 01/26/2008  . DIVERTICULOSIS, COLON 01/26/2008  . IRRITABLE BOWEL SYNDROME 01/26/2008  . SCOLIOSIS 01/26/2008  . HEPATITIS C, HX OF 01/26/2008  . MITRAL VALVE PROLAPSE, HX OF 01/26/2008     Deniece Ree PT, DPT, CBIS  Supplemental Physical Therapist [place]    Pager [phone] Acute Rehab Office [place] [address] [place], [place], [address] Phone: [phone]   Fax:  [phone]  Name: Amanda Terrell MRN: [id number] Date of Birth: 09/18/1955

## 2019-03-24 ENCOUNTER — Inpatient Hospital Stay (HOSPITAL_COMMUNITY): Payer: BC Managed Care – PPO

## 2019-03-24 ENCOUNTER — Inpatient Hospital Stay (HOSPITAL_COMMUNITY): Payer: BC Managed Care – PPO | Attending: Nurse Practitioner

## 2019-03-24 ENCOUNTER — Inpatient Hospital Stay (HOSPITAL_BASED_OUTPATIENT_CLINIC_OR_DEPARTMENT_OTHER): Payer: BC Managed Care – PPO | Admitting: Nurse Practitioner

## 2019-03-24 ENCOUNTER — Other Ambulatory Visit (HOSPITAL_COMMUNITY): Payer: Self-pay | Admitting: Nurse Practitioner

## 2019-03-24 VITALS — BP 144/73 | HR 80 | Temp 96.9°F | Resp 20 | Wt 148.0 lb

## 2019-03-24 DIAGNOSIS — Z808 Family history of malignant neoplasm of other organs or systems: Secondary | ICD-10-CM | POA: Insufficient documentation

## 2019-03-24 DIAGNOSIS — M810 Age-related osteoporosis without current pathological fracture: Secondary | ICD-10-CM

## 2019-03-24 DIAGNOSIS — R5383 Other fatigue: Secondary | ICD-10-CM | POA: Insufficient documentation

## 2019-03-24 DIAGNOSIS — Z801 Family history of malignant neoplasm of trachea, bronchus and lung: Secondary | ICD-10-CM | POA: Diagnosis not present

## 2019-03-24 DIAGNOSIS — C50412 Malignant neoplasm of upper-outer quadrant of left female breast: Secondary | ICD-10-CM

## 2019-03-24 DIAGNOSIS — Z170 Estrogen receptor positive status [ER+]: Secondary | ICD-10-CM | POA: Diagnosis not present

## 2019-03-24 DIAGNOSIS — Z780 Asymptomatic menopausal state: Secondary | ICD-10-CM | POA: Diagnosis not present

## 2019-03-24 DIAGNOSIS — Z79899 Other long term (current) drug therapy: Secondary | ICD-10-CM | POA: Insufficient documentation

## 2019-03-24 DIAGNOSIS — Z803 Family history of malignant neoplasm of breast: Secondary | ICD-10-CM | POA: Diagnosis not present

## 2019-03-24 DIAGNOSIS — Z1231 Encounter for screening mammogram for malignant neoplasm of breast: Secondary | ICD-10-CM | POA: Diagnosis not present

## 2019-03-24 DIAGNOSIS — Z8249 Family history of ischemic heart disease and other diseases of the circulatory system: Secondary | ICD-10-CM | POA: Diagnosis not present

## 2019-03-24 DIAGNOSIS — Z923 Personal history of irradiation: Secondary | ICD-10-CM | POA: Insufficient documentation

## 2019-03-24 LAB — COMPREHENSIVE METABOLIC PANEL
ALT: 15 U/L (ref 0–44)
AST: 21 U/L (ref 15–41)
Albumin: 4.4 g/dL (ref 3.5–5.0)
Alkaline Phosphatase: 55 U/L (ref 38–126)
Anion gap: 9 (ref 5–15)
BUN: 21 mg/dL (ref 8–23)
CO2: 29 mmol/L (ref 22–32)
Calcium: 9.7 mg/dL (ref 8.9–10.3)
Chloride: 99 mmol/L (ref 98–111)
Creatinine, Ser: 0.64 mg/dL (ref 0.44–1.00)
GFR calc Af Amer: >60 mL/min (ref >60)
GFR calc non Af Amer: >60 mL/min (ref >60)
Glucose, Bld: 101 mg/dL — ABNORMAL HIGH (ref 70–99)
Potassium: 4.3 mmol/L (ref 3.5–5.1)
Sodium: 137 mmol/L (ref 135–145)
Total Bilirubin: 0.6 mg/dL (ref 0.3–1.2)
Total Protein: 7.5 g/dL (ref 6.5–8.1)

## 2019-03-24 LAB — CBC WITH DIFFERENTIAL/PLATELET
Abs Immature Granulocytes: 0.01 10*3/uL (ref 0.00–0.07)
Basophils Absolute: 0.1 10*3/uL (ref 0.0–0.1)
Basophils Relative: 1 %
Eosinophils Absolute: 0.1 10*3/uL (ref 0.0–0.5)
Eosinophils Relative: 2 %
HCT: 47.3 % — ABNORMAL HIGH (ref 36.0–46.0)
Hemoglobin: 14.7 g/dL (ref 12.0–15.0)
Immature Granulocytes: 0 %
Lymphocytes Relative: 20 %
Lymphs Abs: 1.3 10*3/uL (ref 0.7–4.0)
MCH: 29.2 pg (ref 26.0–34.0)
MCHC: 31.1 g/dL (ref 30.0–36.0)
MCV: 94 fL (ref 80.0–100.0)
Monocytes Absolute: 0.4 10*3/uL (ref 0.1–1.0)
Monocytes Relative: 7 %
Neutro Abs: 4.5 10*3/uL (ref 1.7–7.7)
Neutrophils Relative %: 70 %
Platelets: 246 10*3/uL (ref 150–400)
RBC: 5.03 MIL/uL (ref 3.87–5.11)
RDW: 13.3 % (ref 11.5–15.5)
WBC: 6.5 10*3/uL (ref 4.0–10.5)
nRBC: 0 % (ref 0.0–0.2)

## 2019-03-24 LAB — LACTATE DEHYDROGENASE: LDH: 135 U/L (ref 98–192)

## 2019-03-24 MED ORDER — DENOSUMAB 60 MG/ML ~~LOC~~ SOSY
60.0000 mg | PREFILLED_SYRINGE | Freq: Once | SUBCUTANEOUS | Status: AC
  Administered 2019-03-24: 15:00:00 60 mg via SUBCUTANEOUS

## 2019-03-24 MED ORDER — DENOSUMAB 60 MG/ML ~~LOC~~ SOSY
PREFILLED_SYRINGE | SUBCUTANEOUS | Status: AC
  Filled 2019-03-24: qty 1

## 2019-03-24 NOTE — Patient Instructions (Signed)
[facility] Discharge Instructions  Follow up in 6 months with labs and mammogram   Thank you for choosing  Cancer Center at [facility] to provide your oncology and hematology care.  To afford each patient quality time with our provider, please arrive at least 15 minutes before your scheduled appointment time.   If you have a lab appointment with the Cancer Center please come in thru the Main Entrance and check in at the main information desk.  You need to re-schedule your appointment should you arrive 10 or more minutes late.  We strive to give you quality time with our providers, and arriving late affects you and other patients whose appointments are after yours.  Also, if you no show three or more times for appointments you may be dismissed from the clinic at the providers discretion.     Again, thank you for choosing [facility].  Our hope is that these requests will decrease the amount of time that you wait before being seen by our physicians.       _____________________________________________________________  Should you have questions after your visit to [facility], please contact our office at [phone] between the hours of 8:00 a.m. and 4:30 p.m.  Voicemails left after 4:00 p.m. will not be returned until the following business day.  For prescription refill requests, have your pharmacy contact our office and allow 72 hours.    Due to Covid, you will need to wear a mask upon entering the hospital. If you do not have a mask, a mask will be given to you at the Main Entrance upon arrival. For doctor visits, patients may have 1 support person with them. For treatment visits, patients can not have anyone with them due to social distancing guidelines and our immunocompromised population.      

## 2019-03-24 NOTE — Progress Notes (Signed)
Amanda Terrell presents today for injection per MD orders. Prolia administered SQ in right Upper Arm. Administration without incident. Patient tolerated well.

## 2019-03-24 NOTE — Addendum Note (Signed)
Addended by: Glennie Isle on: 03/24/2019 03:23 PM   Modules accepted: Orders

## 2019-03-24 NOTE — Progress Notes (Signed)
Amanda Terrell, [place] [id number]   CLINIC:  Medical Oncology/Hematology  PCP:  [name], [address] [place] [address] [phone]   REASON FOR VISIT: Follow-up for left breast cancer  CURRENT THERAPY: Surveillance per NCCN guidelines  BRIEF ONCOLOGIC HISTORY:  Oncology History  Breast cancer of upper-outer quadrant of left female breast ([place])  07/05/2014 Breast US   Palpable left breast mass; U/S revealed irregular mass in UOQ of left breast with associated pleomorphic calcs. Mass and calcs span 1.5 cm.    07/07/2014 Initial Biopsy   Left breast needle core biopsy, 2 o'clock position revealed intermediate grade DCIS with calcs. ER+ (100%), PR+ (59%).    07/07/2014 Initial Diagnosis   Breast cancer of upper-outer quadrant of left female breast   07/12/2014 Breast MRI   UOQ of left breast with 2.0 x 1.4 x 1.3 cm biopsy-proven ductal carcinoma. No evidence of malignancy elsewhere in either breast. No adenopathy.    08/05/2014 Definitive Surgery   Left lumpectomy (Dr. Dalbert Batman) revealed intermediate grade DCIS, spanning 2.2 cm. Negative margins. 1 intramammary LN benign.    09/05/2014 - 10/04/2014 Radiation Therapy   Left breast: total radiation dose 42.72 Gy over 21 fractions.  Left breast boost: total radiation dose 10 Gy over 5 fractions.    09/22/2014 - 10/21/2014 Anti-estrogen oral therapy   Tamoxifen started by Dr. Whitney Muse.  Planned duration of treatment: 5 years.    10/16/2014 Adverse Reaction   Did not feel well. Worsening pain/joint pain   10/28/2014 Survivorship   Survivorship Care Plan given and reviewed with patient in-person.       CANCER STAGING: Cancer Staging Breast cancer of upper-outer quadrant of left female breast [facility]) Staging form: Breast, AJCC 7th Edition - Clinical stage from 07/13/2014: Stage 0 (Tis (DCIS), N0, M0) - Unsigned - Pathologic stage from 08/08/2014: Stage Unknown (Tis (DCIS), NX, cM0) -  Signed by Seward Grater, MD on 08/15/2014    INTERVAL HISTORY:  [name] 63 y.o. female returns for routine follow-up for left breast cancer.  Patient reports she is been doing well since her last visit. Denies any nausea, vomiting, or diarrhea. Denies any new pains. Had not noticed any recent bleeding such as epistaxis, hematuria or hematochezia. Denies recent chest pain on exertion, shortness of breath on minimal exertion, pre-syncopal episodes, or palpitations. Denies any numbness or tingling in hands or feet. Denies any recent fevers, infections, or recent hospitalizations. Patient reports appetite at 100% and energy level at 100%.  She is eating well maintaining her weight at this time.   REVIEW OF SYSTEMS:  Review of Systems  Constitutional: Positive for fatigue.  All other systems reviewed and are negative.    PAST MEDICAL/SURGICAL HISTORY:  Past Medical History:  Diagnosis Date  . 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 Arthritis   . Axillary abscess    left  . Barrett's esophagus   . Blood transfusion without reported diagnosis    1989  . Breast cancer of upper-outer quadrant of left female breast ([place]) 07/11/2014  . Cataract    both eyes in 2016  . Cellulitis and abscess of unspecified site   . Diverticulosis   . Esophageal stricture   . GERD (gastroesophageal reflux disease)   . Heart murmur   . Hepatitis C 1990   interferon treatment  . Hiatal hernia   . Hyperlipidemia   . IBS (irritable bowel syndrome)   . Internal  hemorrhoids   . Mitral valve prolapse   . Neuropathy   . Osteoporosis   . Other specified disease of hair and hair follicles   . Personal history of radiation therapy 2016  . PONV (postoperative nausea and vomiting)   . Postmenopausal 02/24/2018  . 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 Reflex sympathetic dystrophy   . Rosacea   . Scoliosis    adhesive arachnoditis-right side   Past Surgical History:  Procedure Laterality Date   . [place], 2006   due to scoliosis= total 5 back surgeries  . BREAST FIBROADENOMA SURGERY     bilateral  . BREAST LUMPECTOMY Left   . BREAST LUMPECTOMY WITH RADIOACTIVE SEED LOCALIZATION Left 08/05/2014   Procedure: LEFT PARTICAL MASTECTOMY WITH RADIOACTIVE SEED LOCALIZATION;  Surgeon: Fanny Skates, MD;  Location: [place];  Service: General;  Laterality: Left;  . [place], 1980, 1984  . COLONOSCOPY  2009  . HYSTEROSCOPY    . INCISE AND DRAIN ABCESS     left axillary abscess  . SPINAL FUSION    . spinal surgeries    . TONSILLECTOMY    . UPPER GASTROINTESTINAL ENDOSCOPY       SOCIAL HISTORY:  Social History   Socioeconomic History  . Marital status: Married    Spouse name: Not on file  . Number of children: Not on file  . Years of education: Not on file  . Highest education level: Not on file  Occupational History  . Not on file  Social Needs  . Financial resource strain: Not on file  . Food insecurity    Worry: Not on file    Inability: Not on file  . Transportation needs    Medical: Not on file    Non-medical: Not on file  Tobacco Use  . Smoking status: Never Smoker  . Smokeless tobacco: Never Used  Substance and Sexual Activity  . Alcohol use: Yes    Alcohol/week: 0.0 standard drinks    Comment: 1 glass wine a month  . Drug use: No  . Sexual activity: Not Currently    Comment: 1st intercourse- 16, partners- 1, married- 81 yrs   Lifestyle  . Physical activity    Days per week: Not on file    Minutes per session: Not on file  . Stress: Not on file  Relationships  . Social Herbalist on phone: Not on file    Gets together: Not on file    Attends religious service: Not on file    Active member of club or organization: Not on file    Attends meetings of clubs or organizations: Not on file    Relationship status: Not on file  . Intimate partner violence    Fear of current or ex partner: Not on  file    Emotionally abused: Not on file    Physically abused: Not on file    Forced sexual activity: Not on file  Other Topics Concern  . Not on file  Social History Narrative  . Not on file    FAMILY HISTORY:  Family History  Problem Relation Age of Onset  . Heart disease Mother   . Brain cancer Father        Glioblastoma  . Melanoma Paternal Grandmother   . Breast cancer Maternal Aunt        in her 32s  . Lung cancer Maternal Aunt   .  Colon cancer Neg Hx   . Stomach cancer Neg Hx   . Rectal cancer Neg Hx   . Esophageal cancer Neg Hx     CURRENT MEDICATIONS:  Outpatient Encounter Medications as of 03/24/2019  Medication Sig  . Ascorbic Acid (VITA-C PO) Take by mouth.  . Calcium Carb-Cholecalciferol (CALCIUM 1000 + D) 1000-800 MG-UNIT TABS Take by mouth.  . cholecalciferol (VITAMIN D) 1000 UNITS tablet Take 1,000 Units by mouth daily. Reported on 07/12/2015  . gabapentin (NEURONTIN) 300 MG capsule Take 300 mg by mouth 3 (three) times daily.  Marland Kitchen ibuprofen (ADVIL,MOTRIN) 200 MG tablet Take 400 mg by mouth every 4 (four) hours as needed for mild pain or moderate pain.  . Multiple Vitamin (MULTIVITAMIN) tablet Take 1 tablet by mouth daily. Reported on 07/12/2015  . Zinc 100 MG TABS Take by mouth.  . diphenhydrAMINE (BENADRYL) 25 mg capsule Take 25 mg by mouth at bedtime as needed for itching or sleep.   Marland Kitchen omeprazole (PRILOSEC) 20 MG capsule Take 1 capsule (20 mg total) by mouth daily as needed. (Patient not taking: Reported on 03/24/2019)  . [DISCONTINUED] hydrocortisone (ANUSOL-HC) 25 MG suppository Place 1 suppository (25 mg total) rectally 2 (two) times daily.  . [DISCONTINUED] HYDROmorphone (DILAUDID) 4 MG tablet Take 2-4 mg by mouth every 6 (six) hours as needed for moderate pain or severe pain.    No facility-administered encounter medications on file as of 03/24/2019.     ALLERGIES:  Allergies  Allergen Reactions  . Acetaminophen Other (See Comments)    Liver  sensitivity secondary to h/o hep. c     PHYSICAL EXAM:  ECOG Performance status: 1  Vitals:   03/24/19 1427  BP: (!) 144/73  Pulse: 80  Resp: 20  Temp: (!) 96.9 F (36.1 C)  SpO2: 100%   Filed Weights   03/24/19 1427  Weight: 148 lb (67.1 kg)    Physical Exam Constitutional:      Appearance: Normal appearance. She is normal weight.  Cardiovascular:     Rate and Rhythm: Normal rate and regular rhythm.     Heart sounds: Normal heart sounds.  Pulmonary:     Effort: Pulmonary effort is normal.     Breath sounds: Normal breath sounds.  Abdominal:     General: Bowel sounds are normal.     Palpations: Abdomen is soft.  Musculoskeletal: Normal range of motion.  Skin:    General: Skin is warm and dry.  Neurological:     Mental Status: She is alert and oriented to person, place, and time. Mental status is at baseline.  Psychiatric:        Mood and Affect: Mood normal.        Behavior: Behavior normal.        Thought Content: Thought content normal.        Judgment: Judgment normal.      LABORATORY DATA:  I have reviewed the labs as listed.  CBC    Component Value Date/Time   WBC 6.5 03/24/2019 1355   RBC 5.03 03/24/2019 1355   HGB 14.7 03/24/2019 1355   HGB 14.7 07/13/2014 1220   HCT 47.3 (H) 03/24/2019 1355   HCT 45.7 07/13/2014 1220   PLT 246 03/24/2019 1355   PLT 244 07/13/2014 1220   MCV 94.0 03/24/2019 1355   MCV 87.9 07/13/2014 1220   MCH 29.2 03/24/2019 1355   MCHC 31.1 03/24/2019 1355   RDW 13.3 03/24/2019 1355   RDW 13.5 07/13/2014 1220  LYMPHSABS 1.3 03/24/2019 1355   LYMPHSABS 1.3 07/13/2014 1220   MONOABS 0.4 03/24/2019 1355   MONOABS 0.5 07/13/2014 1220   EOSABS 0.1 03/24/2019 1355   EOSABS 0.1 07/13/2014 1220   BASOSABS 0.1 03/24/2019 1355   BASOSABS 0.1 07/13/2014 1220   CMP Latest Ref Rng & Units 03/24/2019 09/11/2018 03/10/2018  Glucose 70 - 99 mg/dL 101(H) 77 98  BUN 8 - 23 mg/dL 21 21 14   Creatinine 0.44 - 1.00 mg/dL 0.64 0.64 0.52   Sodium 135 - 145 mmol/L 137 139 140  Potassium 3.5 - 5.1 mmol/L 4.3 4.3 4.6  Chloride 98 - 111 mmol/L 99 101 103  CO2 22 - 32 mmol/L 29 29 32  Calcium 8.9 - 10.3 mg/dL 9.7 9.2 9.4  Total Protein 6.5 - 8.1 g/dL 7.5 7.1 6.8  Total Bilirubin 0.3 - 1.2 mg/dL 0.6 0.8 0.8  Alkaline Phos 38 - 126 U/L 55 49 60  AST 15 - 41 U/L 21 21 18   ALT 0 - 44 U/L 15 17 17      I personally performed a face-to-face visit.  All questions were answered to patient's stated satisfaction. Encouraged patient to call with any new concerns or questions before his next visit to the cancer center and we can certain see him sooner, if needed.     ASSESSMENT & PLAN:   Breast cancer of upper-outer quadrant of left female breast ([place]) 1.  DCIS of the left breast: -Biopsy on 07/07/2014, DCIS, ER/PR positive. -Left lumpectomy on 08/05/2014, 2.2 cm DCIS, intermediate grade, 0/1 lymph nodes positive, ER/PR positive. -Underwent radiation therapy from 09/15/2014-10/04/2014. -Took tamoxifen for only 1 week and had developed arthralgias and myalgias and could not continue it. -Physical assessment today did not reveal any suspicious masses or adenopathy. - Last mammogram on 08/11/2018 showed B RADS category 2. -We will order her repeat mammogram in 6 months. -Labs done on 03/24/2019 showed potassium 4.3, creatinine 0.64, LFTs WNL, LDH 135, WBC 6.5, hemoglobin 14.7, platelets 246. - She will follow-up in 6 months with repeat labs and mammogram.  2.  Osteoporosis: -DEXA scan on 10/03/2017 showed T score of -3.9. -She started on Prolia on 03/10/2018.  Prior to that she was on Fosamax and could not tolerate it secondary to GI side effects. - Her calcium was within normal limits. -She takes calcium and vitamin D daily. -We will repeat a DEXA scan in 09/2019      Orders placed this encounter:  Orders Placed This Encounter  Procedures  . Lactate dehydrogenase  . CBC with Differential/Platelet  . Comprehensive metabolic panel  .  VITAMIN D 25 Hydroxy (Vit-D Deficiency, Fractures)  . Vitamin B12      Francene Finders, FNP-C [place] [phone]

## 2019-03-24 NOTE — Patient Instructions (Signed)
[facility]  Discharge Instructions:   _______________________________________________________________  Thank you for choosing [facility] at [facility] to provide your oncology and hematology care.  To afford each patient quality time with our providers, please arrive at least 15 minutes before your scheduled appointment.  You need to re-schedule your appointment if you arrive 10 or more minutes late.  We strive to give you quality time with our providers, and arriving late affects you and other patients whose appointments are after yours.  Also, if you no show three or more times for appointments you may be dismissed from the clinic.  Again, thank you for choosing [facility]. Our hope is that these requests will allow you access to exceptional care and in a timely manner. _______________________________________________________________  If you have questions after your visit, please contact our office at [phone] between the hours of 8:30 a.m. and 5:00 p.m. Voicemails left after 4:30 p.m. will not be returned until the following business day. _______________________________________________________________  For prescription refill requests, have your pharmacy contact our office. _______________________________________________________________  Recommendations made by the consultant and any test results will be sent to your referring physician. _______________________________________________________________ 

## 2019-03-24 NOTE — Assessment & Plan Note (Addendum)
1.  DCIS of the left breast: -Biopsy on 07/07/2014, DCIS, ER/PR positive. -Left lumpectomy on 08/05/2014, 2.2 cm DCIS, intermediate grade, 0/1 lymph nodes positive, ER/PR positive. -Underwent radiation therapy from 09/15/2014-10/04/2014. -Took tamoxifen for only 1 week and had developed arthralgias and myalgias and could not continue it. -Physical assessment today did not reveal any suspicious masses or adenopathy. - Last mammogram on 08/11/2018 showed B RADS category 2. -We will order her repeat mammogram in 6 months. -Labs done on 03/24/2019 showed potassium 4.3, creatinine 0.64, LFTs WNL, LDH 135, WBC 6.5, hemoglobin 14.7, platelets 246. - She will follow-up in 6 months with repeat labs and mammogram.  2.  Osteoporosis: -DEXA scan on 10/03/2017 showed T score of -3.9. -She started on Prolia on 03/10/2018.  Prior to that she was on Fosamax and could not tolerate it secondary to GI side effects. - Her calcium was within normal limits. -She takes calcium and vitamin D daily. -We will repeat a DEXA scan in 09/2019

## 2019-03-25 ENCOUNTER — Ambulatory Visit (HOSPITAL_COMMUNITY): Payer: BC Managed Care – PPO | Admitting: Physical Therapy

## 2019-03-25 ENCOUNTER — Encounter (HOSPITAL_COMMUNITY): Payer: Self-pay | Admitting: Physical Therapy

## 2019-03-25 ENCOUNTER — Other Ambulatory Visit: Payer: Self-pay

## 2019-03-25 DIAGNOSIS — R29898 Other symptoms and signs involving the musculoskeletal system: Secondary | ICD-10-CM

## 2019-03-25 DIAGNOSIS — M6281 Muscle weakness (generalized): Secondary | ICD-10-CM

## 2019-03-25 DIAGNOSIS — R262 Difficulty in walking, not elsewhere classified: Secondary | ICD-10-CM

## 2019-03-25 DIAGNOSIS — R29818 Other symptoms and signs involving the nervous system: Secondary | ICD-10-CM

## 2019-03-25 NOTE — Patient Instructions (Signed)
   Band Resisted Side Stepping  Place theraband  around the balls of your feet.  Perform mini-squat as pictured.  Keep your toes pointing forward as you step to the side. Make sure to pick up the feet so they do not drag on the floor. Try to maintain the mini-squat.    Make sure your toes on the right are pointed forward, not turning out (the band should help with this).  Repeat 3 times in parallel bars, twice a day.    Partial Squat with Chair (green band just above knees)  Stand with your feet shoulder width apart, slightly away from chair. Push hips back and get into a partial squat position.  Make sure you are pushing your right leg out into the band and it is not falling in towards your other leg. Your husband may need to help you hold that leg in correct position.  Make sure you are shifting your weight over your right leg while you are holding the squat. Try to hold for 3-5 seconds.  Repeat 10-15 times, 1-2 times per day.

## 2019-03-25 NOTE — Therapy (Signed)
[facility] [address] [place], [place], [address] Phone: [phone]   Fax:  [phone]  Physical Therapy Treatment  Patient Details  Name: Amanda Terrell MRN: [id number] Date of Birth: [birth year] Referring Provider (PT): Glenna Fellows    Encounter Date: 03/25/2019  PT End of Session - 03/25/19 1102    Visit Number  16    Number of Visits  22    Date for PT Re-Evaluation  04/08/19    Authorization Type  [employer] Other    Authorization Time Period  [id number] - [id number]; new cert [id number]    Authorization - Visit Number  16    Authorization - Number of Visits  30    PT Start Time  1017    PT Stop Time  1057    PT Time Calculation (min)  40 min    Equipment Utilized During Treatment  Gait belt    Activity Tolerance  Patient tolerated treatment well    Behavior During Therapy  WFL for tasks assessed/performed       Past Medical History:  Diagnosis Date  . 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 Arthritis   . Axillary abscess    left  . Barrett's esophagus   . Blood transfusion without reported diagnosis    1989  . Breast cancer of upper-outer quadrant of left female breast ([place]) 07/11/2014  . Cataract    both eyes in 2016  . Cellulitis and abscess of unspecified site   . Diverticulosis   . Esophageal stricture   . GERD (gastroesophageal reflux disease)   . Heart murmur   . Hepatitis C 1990   interferon treatment  . Hiatal hernia   . Hyperlipidemia   . IBS (irritable bowel syndrome)   . Internal hemorrhoids   . Mitral valve prolapse   . Neuropathy   . Osteoporosis   . Other specified disease of hair and hair follicles   . Personal history of radiation therapy 2016  . PONV (postoperative nausea and vomiting)   . Postmenopausal 02/24/2018  . 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 Reflex sympathetic dystrophy   . Rosacea   . Scoliosis    adhesive arachnoditis-right side    Past Surgical History:  Procedure  Laterality Date  . [place], 2006   due to scoliosis= total 5 back surgeries  . BREAST FIBROADENOMA SURGERY     bilateral  . BREAST LUMPECTOMY Left   . BREAST LUMPECTOMY WITH RADIOACTIVE SEED LOCALIZATION Left 08/05/2014   Procedure: LEFT PARTICAL MASTECTOMY WITH RADIOACTIVE SEED LOCALIZATION;  Surgeon: Fanny Skates, MD;  Location: [facility];  Service: General;  Laterality: Left;  . [place], 1980, 1984  . COLONOSCOPY  2009  . HYSTEROSCOPY    . INCISE AND DRAIN ABCESS     left axillary abscess  . SPINAL FUSION    . spinal surgeries    . TONSILLECTOMY    . UPPER GASTROINTESTINAL ENDOSCOPY      There were no vitals filed for this visit.  Subjective Assessment - 03/25/19 1020    Subjective  I am feeling good today, still a little tired in my right quad but otherwise OK. I really like the Nustep.    Currently in Pain?  Yes    Pain Score  8     Pain Location  Leg    Pain Orientation  Right    Pain Descriptors /  Indicators  Burning    Pain Type  Chronic pain                       [facility] Adult PT Treatment/Exercise - 03/25/19 0001      Knee/Hip Exercises: Aerobic   Nustep  @EOS  Nustep hills 3 level 4 LEs only (not included in billing)      Knee/Hip Exercises: Standing   Other Standing Knee Exercises  side steps x3 RT with green TB above knees, then around forefeet x3     Other Standing Knee Exercises  partial squat holds with green TB 1x20, mod cues for weight shift over R LE       Knee/Hip Exercises: Seated   Sit to Sand  1 set;20 reps;with UE support   green TB for hip strength/position          Balance Exercises - 03/25/19 1044      Balance Exercises: Standing   Standing Eyes Opened  Narrow base of support (BOS);3 reps;20 secs   eyes closed    Tandem Stance  Eyes open;Intermittent upper extremity support;3 reps;20 secs   head turns    Retro Gait  4 reps        PT Education - 03/25/19 1102     Education Details  exercise form and purpose, HEP updates    Person(s) Educated  Patient    Methods  Explanation;Handout    Comprehension  Verbalized understanding;Returned demonstration       PT Short Term Goals - 02/25/19 1048      PT SHORT TERM GOAL #1   Title  Pt will have improved MMT by 1/2 grade throughout proximal hip mm in order to maximize gait and balance.    Baseline  7/30- muscles not affeccted by neuro syndrome improving in strength (flowsheets)    Time  3    Period  Weeks    Status  Achieved      PT SHORT TERM GOAL #2   Title  Pt will have improved 30sec chair rise test to 13x to demo improved functional strength and balance.    Baseline  7/30- 12    Time  3    Period  Weeks    Status  On-going      PT SHORT TERM GOAL #3   Title  Pt will have improved DGI score to 16/24 to demo improved dynamic balance with gait and reduce her risk for falling.    Baseline  7/30- 15/24    Time  3    Period  Weeks    Status  On-going        PT Long Term Goals - 02/25/19 1051      PT LONG TERM GOAL #1   Title  Pt will have improved MMT by 1 grade throughout proximal hip mm in order to further maximize gait and balance.    Baseline  7/30- flowsheets    Time  6    Period  Weeks    Status  On-going      PT LONG TERM GOAL #2   Title  Pt will have improved L SLS to 10sec without UE and improved R SLS to 3sec without UE in order to demo improved functional hip and core strength and to maximize her gait.    Baseline  7/30- 9 seconds L, R 1-2 seconds R    Time  6    Period  Weeks    Status  On-going  PT LONG TERM GOAL #3   Title  Patient to be 1574ft with B SPCs at self-selected pace without rest breaks or fatigue in order to improve ability to access trail heads and enjoy vacations with family    Baseline  7/30- 366ft today, 428ft at best and revised goal to distance rather than gait speed    Time  6    Period  Weeks    Status  Revised      PT LONG TERM GOAL #4    Title  Pt will report being able to stand for 10 mins without UE support to demo improved BLE strength, endurance, and balance in order to allow her to stand and perform self-hygiene and prepare a small meal in the kitchen with greater ease.    Baseline  7/30- not tested today    Time  6    Period  Weeks    Status  On-going            Plan - 03/25/19 1102    Clinical Impression Statement  Focused today's session on R quad strength and control as well as control of R hip musculature and LE position. Spent quite a bit of time working on activities such as STS with eccentric lower and green TB around thighs for R hip ABD, side stepping in bars with green TB around forefeet for improved control of foot position and improved R hip abductor strength, and partial squats with green TB around thighs for increased hip abductor activation. Manual positioning and facilitation still provided for safe/correct position of R knee due to ongoing weakness. Also worked on Medical illustrator and finished session on Nustep with progressed settings. HEP updates provided as patient confident her husband will be able to assist in correct positioning of knee during exercises per her direction.    Personal Factors and Comorbidities  Age;Comorbidity 3+;Past/Current Experience;Time since onset of injury/illness/exacerbation    Comorbidities  see above    Examination-Activity Limitations  Locomotion Level;Stand;Sleep    Examination-Participation Restrictions  Community Activity;Yard Work    Stability/Clinical Decision Making  Stable/Uncomplicated    Designer, jewellery  Low    Rehab Potential  Good    PT Frequency  2x / week    PT Duration  6 weeks    PT Treatment/Interventions  ADLs/Self Care Home Management;Aquatic Therapy;Cryotherapy;Electrical Stimulation;Moist Heat;DME Instruction;Gait training;Stair training;Functional mobility training;Therapeutic activities;Therapeutic exercise;Balance  training;Neuromuscular re-education;Patient/family education;Orthotic Fit/Training;Manual techniques;Wheelchair mobility training;Passive range of motion;Scar mobilization;Dry needling;Energy conservation;Taping    PT Next Visit Plan  avoid step downs off of steps due to high levels of tibial ER and knee flexion putting ligaments/meniscus at risk. Strength, endurance, balance. Continue progressing Nustep.    PT Home Exercise Plan  eval: bridging, STS; 6/25: supine clam RTB, sidelying reverse clams with husband's assistance; 7/7: bridge (push through heels cues), mini squat, wall squat, hip hikes on step, tandem stance with support surface close by if needed for any exercise; 7/22 - hip ext/abd with red tb at knees in standing; 8/27- side stesps with green TB around forefeet, partial squats in front of chair with green TB and manual help from husband    Consulted and Agree with Plan of Care  Patient       Patient will benefit from skilled therapeutic intervention in order to improve the following deficits and impairments:  Abnormal gait, Decreased activity tolerance, Decreased balance, Decreased endurance, Decreased mobility, Decreased strength, Difficulty walking, Hypomobility, Increased fascial restricitons, Increased muscle spasms, Impaired flexibility, Improper  body mechanics, Pain  Visit Diagnosis: Muscle weakness (generalized)  Difficulty in walking, not elsewhere classified  Other symptoms and signs involving the musculoskeletal system  Other symptoms and signs involving the nervous system     Problem List Patient Active Problem List   Diagnosis Date Noted  . Postmenopausal 02/24/2018  . Pain syndrome, chronic 08/06/2015  . Lower extremity pain, left 04/12/2015  . Osteoporosis 11/21/2014  . Breast cancer of upper-outer quadrant of left female breast ([place]) 07/11/2014  . Abscess of axilla, left 06/27/2011  . HYPERLIPIDEMIA 01/26/2008  . REFLEX SYMPATHETIC DYSTROPHY 01/26/2008  .  NEUROPATHY 01/26/2008  . HEMORRHOIDS, INTERNAL 01/26/2008  . ESOPHAGEAL STRICTURE 01/26/2008  . GERD 01/26/2008  . BARRETTS ESOPHAGUS 01/26/2008  . DIVERTICULOSIS, COLON 01/26/2008  . IRRITABLE BOWEL SYNDROME 01/26/2008  . SCOLIOSIS 01/26/2008  . HEPATITIS C, HX OF 01/26/2008  . MITRAL VALVE PROLAPSE, HX OF 01/26/2008    Deniece Ree PT, DPT, CBIS  Supplemental Physical Therapist [place]    Pager [phone] Acute Rehab Office [facility] [address] [place], [place], [address] Phone: [phone]   Fax:  [phone]  Name: Amanda Terrell MRN: [id number] Date of Birth: [birth year]

## 2019-03-30 ENCOUNTER — Other Ambulatory Visit: Payer: Self-pay

## 2019-03-30 ENCOUNTER — Ambulatory Visit (HOSPITAL_COMMUNITY): Payer: BC Managed Care – PPO | Attending: Neurosurgery

## 2019-03-30 ENCOUNTER — Encounter (HOSPITAL_COMMUNITY): Payer: Self-pay

## 2019-03-30 DIAGNOSIS — R29818 Other symptoms and signs involving the nervous system: Secondary | ICD-10-CM | POA: Diagnosis present

## 2019-03-30 DIAGNOSIS — M6281 Muscle weakness (generalized): Secondary | ICD-10-CM | POA: Diagnosis present

## 2019-03-30 DIAGNOSIS — R29898 Other symptoms and signs involving the musculoskeletal system: Secondary | ICD-10-CM | POA: Diagnosis present

## 2019-03-30 DIAGNOSIS — R262 Difficulty in walking, not elsewhere classified: Secondary | ICD-10-CM | POA: Diagnosis present

## 2019-03-30 NOTE — Therapy (Signed)
[place], [place], [address] Phone: [phone]   Fax:  [phone]  Physical Therapy Treatment  Patient Details  Name: Amanda Terrell MRN: [id number] Date of Birth: Dec 15, 1955 Referring Provider (PT): Glenna Fellows    Encounter Date: 03/30/2019  PT End of Session - 03/30/19 1454    Visit Number  17    Number of Visits  22    Date for PT Re-Evaluation  04/08/19    Authorization Type  BCBS Other    Authorization Time Period  [id number] - [id number]; new cert [id number]    Authorization - Visit Number  9    Authorization - Number of Visits  30    PT Start Time  1432    Equipment Utilized During Treatment  --    Activity Tolerance  Patient tolerated treatment well    Behavior During Therapy  [facility] for tasks assessed/performed       Past Medical History:  Diagnosis Date  . 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 Arthritis   . Axillary abscess    left  . Barrett's esophagus   . Blood transfusion without reported diagnosis    1989  . Breast cancer of upper-outer quadrant of left female breast ([place]) 07/11/2014  . Cataract    both eyes in 2016  . Cellulitis and abscess of unspecified site   . Diverticulosis   . Esophageal stricture   . GERD (gastroesophageal reflux disease)   . Heart murmur   . Hepatitis C 1990   interferon treatment  . Hiatal hernia   . Hyperlipidemia   . IBS (irritable bowel syndrome)   . Internal hemorrhoids   . Mitral valve prolapse   . Neuropathy   . Osteoporosis   . Other specified disease of hair and hair follicles   . Personal history of radiation therapy 2016  . PONV (postoperative nausea and vomiting)   . Postmenopausal 02/24/2018  . 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 Reflex sympathetic dystrophy   . Rosacea   . Scoliosis    adhesive arachnoditis-right side    Past Surgical History:  Procedure Laterality Date  . [place], 2006   due to scoliosis=  total 5 back surgeries  . BREAST FIBROADENOMA SURGERY     bilateral  . BREAST LUMPECTOMY Left   . BREAST LUMPECTOMY WITH RADIOACTIVE SEED LOCALIZATION Left 08/05/2014   Procedure: LEFT PARTICAL MASTECTOMY WITH RADIOACTIVE SEED LOCALIZATION;  Surgeon: Fanny Skates, MD;  Location: [place];  Service: General;  Laterality: Left;  . [place], 1980, 1984  . COLONOSCOPY  2009  . HYSTEROSCOPY    . INCISE AND DRAIN ABCESS     left axillary abscess  . SPINAL FUSION    . spinal surgeries    . TONSILLECTOMY    . UPPER GASTROINTESTINAL ENDOSCOPY      There were no vitals filed for this visit.  Subjective Assessment - 03/30/19 1436    Subjective  Pt reports nothing new. Pt reports improvement in overall endurance since starting therapy.    Limitations  Walking;Standing    How long can you sit comfortably?  hurts a little due to sciatica    How long can you stand comfortably?  can stand a little better but still limited, standing more but still limited by weakness    How long can you walk comfortably?  less than a mile  Patient Stated Goals  improve strength and balnace    Currently in Pain?  Yes    Pain Score  6     Pain Location  Leg    Pain Orientation  Right    Pain Descriptors / Indicators  Burning    Pain Type  Chronic pain    Pain Radiating Towards  follows sciatic track    Pain Frequency  Constant    Aggravating Factors   sitting, not take Gabapentin    Pain Relieving Factors  Gabapentin, Motrin    Effect of Pain on Daily Activities  increase            [facility] Adult PT Treatment/Exercise - 03/30/19 0001      Ambulation/Gait   Ambulation Distance (Feet)  300 Feet    Assistive device  Straight cane   bil   Gait Comments  with focus on heel-toe pattern, R hip neutral rotation, cervical exetnsion with gaze forward instead of down      Knee/Hip Exercises: Stretches   Other Knee/Hip Stretches  R sciatic nerve stretch, supine       Knee/Hip Exercises: Aerobic   Nustep  Nustep hills 3 level 4, BLE only      Knee/Hip Exercises: Standing   Other Standing Knee Exercises  standing marching, 3 sec hold, focus on weight-shifting to RLE and out of rotation      Knee/Hip Exercises: Supine   Bridges  15 reps    Bridges with [employer]  15 reps    Bridges with Cl[employer]  15 reps   RTB              PT Short Term Goals - 02/25/19 1048      PT SHORT TERM GOAL #1   Title  Pt will have improved MMT by 1/2 grade throughout proximal hip mm in order to maximize gait and balance.    Baseline  7/30- muscles not affeccted by neuro syndrome improving in strength (flowsheets)    Time  3    Period  Weeks    Status  Achieved      PT SHORT TERM GOAL #2   Title  Pt will have improved 30sec chair rise test to 13x to demo improved functional strength and balance.    Baseline  7/30- 12    Time  3    Period  Weeks    Status  On-going      PT SHORT TERM GOAL #3   Title  Pt will have improved DGI score to 16/24 to demo improved dynamic balance with gait and reduce her risk for falling.    Baseline  7/30- 15/24    Time  3    Period  Weeks    Status  On-going        PT Long Term Goals - 02/25/19 1051      PT LONG TERM GOAL #1   Title  Pt will have improved MMT by 1 grade throughout proximal hip mm in order to further maximize gait and balance.    Baseline  7/30- flowsheets    Time  6    Period  Weeks    Status  On-going      PT LONG TERM GOAL #2   Title  Pt will have improved L SLS to 10sec without UE and improved R SLS to 3sec without UE in order to demo improved functional hip and core strength and to maximize her gait.    Baseline  7/30- 9 seconds L, R 1-2 seconds R    Time  6    Period  Weeks    Status  On-going      PT LONG TERM GOAL #3   Title  Patient to be 1540ft with B SPCs at self-selected pace without rest breaks or fatigue in order to improve ability to access trail heads and enjoy vacations with  family    Baseline  7/30- 360ft today, 42ft at best and revised goal to distance rather than gait speed    Time  6    Period  Weeks    Status  Revised      PT LONG TERM GOAL #4   Title  Pt will report being able to stand for 10 mins without UE support to demo improved BLE strength, endurance, and balance in order to allow her to stand and perform self-hygiene and prepare a small meal in the kitchen with greater ease.    Baseline  7/30- not tested today    Time  6    Period  Weeks    Status  On-going            Plan - 03/30/19 1455    Clinical Impression Statement  Pt able to tolerate bridging exercises with adduction moment and abduction resistance with minimal verbal cues. Added marching with single UE support in parallel bars, focus on weight-shifting to RLE and isometric contraction with maintaining neutral trunk/hip positioning without external rotation. Pt with fair performance, able to maintain out of external rotation with increased focus and repeated reps. Gait training this date with single and bil SPC, emphasis on heel-toe gait pattern, eye gaze up, and neutral R hip rotation to improve weight-shifting onto RLE in stance. Pt with decreased speed and decreased balance with single SPC in LUE, improving speed, mechanics, and balance with bil. Pt continues with NuStep for endurance training and BLE strengthening with BLE positioned in neutral position out of valgus or external rotation to effectively engage quads. Possible decrease in frequency or d/c per reassessment next visit to avoid running out of visits per insurance. Continue to progress as able.    Personal Factors and Comorbidities  Age;Comorbidity 3+;Past/Current Experience;Time since onset of injury/illness/exacerbation    Comorbidities  see above    Examination-Activity Limitations  Locomotion Level;Stand;Sleep    Examination-Participation Restrictions  Community Activity;Yard Work    Stability/Clinical Decision Making   Stable/Uncomplicated    Rehab Potential  Good    PT Frequency  2x / week    PT Duration  6 weeks    PT Treatment/Interventions  ADLs/Self Care Home Management;Aquatic Therapy;Cryotherapy;Electrical Stimulation;Moist Heat;DME Instruction;Gait training;Stair training;Functional mobility training;Therapeutic activities;Therapeutic exercise;Balance training;Neuromuscular re-education;Patient/family education;Orthotic Fit/Training;Manual techniques;Wheelchair mobility training;Passive range of motion;Scar mobilization;Dry needling;Energy conservation;Taping    PT Next Visit Plan  Reassess to possibly decrease frequency or d/c. Strength, endurance, balance. Continue progressing Nustep. avoid step downs off of steps due to high levels of tibial ER and knee flexion putting ligaments/meniscus at risk.    PT Home Exercise Plan  eval: bridging, STS; 6/25: supine clam RTB, sidelying reverse clams with husband's assistance; 7/7: bridge (push through heels cues), mini squat, wall squat, hip hikes on step, tandem stance with support surface close by if needed for any exercise; 7/22 - hip ext/abd with red tb at knees in standing; 8/27- side stesps with green TB around forefeet, partial squats in front of chair with green TB and manual help from husband    Consulted and Agree  with Plan of Care  Patient       Patient will benefit from skilled therapeutic intervention in order to improve the following deficits and impairments:  Abnormal gait, Decreased activity tolerance, Decreased balance, Decreased endurance, Decreased mobility, Decreased strength, Difficulty walking, Hypomobility, Increased fascial restricitons, Increased muscle spasms, Impaired flexibility, Improper body mechanics, Pain  Visit Diagnosis: Muscle weakness (generalized)  Difficulty in walking, not elsewhere classified  Other symptoms and signs involving the musculoskeletal system  Other symptoms and signs involving the nervous  system     Problem List Patient Active Problem List   Diagnosis Date Noted  . Postmenopausal 02/24/2018  . Pain syndrome, chronic 08/06/2015  . Lower extremity pain, left 04/12/2015  . Osteoporosis 11/21/2014  . Breast cancer of upper-outer quadrant of left female breast ([place]) 07/11/2014  . Abscess of axilla, left 06/27/2011  . HYPERLIPIDEMIA 01/26/2008  . REFLEX SYMPATHETIC DYSTROPHY 01/26/2008  . NEUROPATHY 01/26/2008  . HEMORRHOIDS, INTERNAL 01/26/2008  . ESOPHAGEAL STRICTURE 01/26/2008  . GERD 01/26/2008  . BARRETTS ESOPHAGUS 01/26/2008  . DIVERTICULOSIS, COLON 01/26/2008  . IRRITABLE BOWEL SYNDROME 01/26/2008  . SCOLIOSIS 01/26/2008  . HEPATITIS C, HX OF 01/26/2008  . MITRAL VALVE PROLAPSE, HX OF 01/26/2008     Talbot Grumbling PT, DPT 03/30/19, 3:31 PM [facility] [address] [place], [place], [address] Phone: [phone]   Fax:  [phone]  Name: Amanda Terrell MRN: [id number] Date of Birth: 09-Dec-1955

## 2019-04-01 ENCOUNTER — Ambulatory Visit (HOSPITAL_COMMUNITY): Payer: BC Managed Care – PPO | Admitting: Physical Therapy

## 2019-04-01 ENCOUNTER — Encounter (HOSPITAL_COMMUNITY): Payer: Self-pay | Admitting: Physical Therapy

## 2019-04-01 ENCOUNTER — Other Ambulatory Visit: Payer: Self-pay

## 2019-04-01 DIAGNOSIS — M6281 Muscle weakness (generalized): Secondary | ICD-10-CM | POA: Diagnosis not present

## 2019-04-01 DIAGNOSIS — R29898 Other symptoms and signs involving the musculoskeletal system: Secondary | ICD-10-CM

## 2019-04-01 DIAGNOSIS — R262 Difficulty in walking, not elsewhere classified: Secondary | ICD-10-CM

## 2019-04-01 DIAGNOSIS — R29818 Other symptoms and signs involving the nervous system: Secondary | ICD-10-CM

## 2019-04-01 NOTE — Therapy (Signed)
[facility] [address] [place], [place], [address] Phone: [phone]   Fax:  [phone]  Physical Therapy Treatment/ Progress note / discharge summary  Patient Details  Name: Amanda Terrell MRN: [id number] Date of Birth: [birth year] Referring Provider (PT): Glenna Fellows    Encounter Date: 04/01/2019   Progress Note Reporting Period 02/25/19 to 04/01/19  See note below for Objective Data and Assessment of Progress/Goals.     PHYSICAL THERAPY DISCHARGE SUMMARY  Visits from Start of Care: 18  Current functional level related to goals / functional outcomes: See below.    Remaining deficits: See below.    Education / Equipment: Updated HEP  Plan: Patient agrees to discharge.  Patient goals were partially met. Patient is being discharged due to being pleased with the current functional level.  ?????          PT End of Session - 04/01/19 1547    Visit Number  18    Number of Visits  22    Date for PT Re-Evaluation  04/08/19    Authorization Type  BCBS Other    Authorization Time Period  0/81/44 - 02/27/84; new cert 6/31-4/97    Authorization - Visit Number  18    Authorization - Number of Visits  30    PT Start Time  0950    PT Stop Time  1015    PT Time Calculation (min)  25 min    Equipment Utilized During Treatment  Gait belt    Activity Tolerance  Patient tolerated treatment well    Behavior During Therapy  WFL for tasks assessed/performed       Past Medical History:  Diagnosis Date  . 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 Arthritis   . Axillary abscess    left  . Barrett's esophagus   . Blood transfusion without reported diagnosis    1989  . Breast cancer of upper-outer quadrant of left female breast ([place]) 07/11/2014  . Cataract    both eyes in 2016  . Cellulitis and abscess of unspecified site   . Diverticulosis   . Esophageal stricture   . GERD (gastroesophageal reflux disease)   . Heart murmur   .  Hepatitis C 1990   interferon treatment  . Hiatal hernia   . Hyperlipidemia   . IBS (irritable bowel syndrome)   . Internal hemorrhoids   . Mitral valve prolapse   . Neuropathy   . Osteoporosis   . Other specified disease of hair and hair follicles   . Personal history of radiation therapy 2016  . PONV (postoperative nausea and vomiting)   . Postmenopausal 02/24/2018  . 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 Reflex sympathetic dystrophy   . Rosacea   . Scoliosis    adhesive arachnoditis-right side    Past Surgical History:  Procedure Laterality Date  . [place], 2006   due to scoliosis= total 5 back surgeries  . BREAST FIBROADENOMA SURGERY     bilateral  . BREAST LUMPECTOMY Left   . BREAST LUMPECTOMY WITH RADIOACTIVE SEED LOCALIZATION Left 08/05/2014   Procedure: LEFT PARTICAL MASTECTOMY WITH RADIOACTIVE SEED LOCALIZATION;  Surgeon: Fanny Skates, MD;  Location: [facility] [place];  Service: General;  Laterality: Left;  . [place], 1980, 1984  . COLONOSCOPY  2009  . HYSTEROSCOPY    . INCISE AND DRAIN ABCESS     left axillary abscess  .  SPINAL FUSION    . spinal surgeries    . TONSILLECTOMY    . UPPER GASTROINTESTINAL ENDOSCOPY      There were no vitals filed for this visit.  Subjective Assessment - 04/01/19 0954    Subjective  Patient reporting that she is doing well and is ready for discharge. patient reported ability to stand for at least 10 minutes.    Limitations  Walking;Standing    How long can you sit comfortably?  hurts a little due to sciatica    How long can you stand comfortably?  can stand a little better but still limited, standing more but still limited by weakness    How long can you walk comfortably?  less than a mile    Patient Stated Goals  improve strength and balnace    Currently in Pain?  Yes    Pain Score  8     Pain Location  Leg    Pain Orientation  Right    Pain Descriptors / Indicators   Burning    Pain Type  Chronic pain         [facility] PT Assessment - 04/01/19 0001      Strength   Right Hip Flexion  4/5   was 3-   Right Hip Extension  2-/5   was 2-   Right Hip ABduction  2/5   was 2   Left Hip Flexion  4/5   was 3   Left Hip Extension  2+/5   was 2+   Left Hip ABduction  5/5   was 4+   Right Knee Flexion  5/5    Right Knee Extension  5/5    Left Knee Flexion  5/5    Left Knee Extension  5/5    Right Ankle Dorsiflexion  0/5   was 0   Left Ankle Dorsiflexion  5/5      Static Standing Balance   Static Standing - Balance Support  No upper extremity supported    Static Standing Balance -  Activities   Single Leg Stance - Right Leg;Single Leg Stance - Left Leg    Static Standing - Comment/# of Minutes  Lt 12 seconds; right 1 seconds      Standardized Balance Assessment   Standardized Balance Assessment  --   30 second chair rise 13 reps     Dynamic Gait Index   Level Surface  Mild Impairment   was 1   Change in Gait Speed  Mild Impairment   was 2   Gait with Horizontal Head Turns  Mild Impairment   was 2   Gait with Vertical Head Turns  Mild Impairment   was 2   Gait and Pivot Turn  Mild Impairment   was 2   Step Over Obstacle  Mild Impairment   was 2   Step Around Obstacles  Mild Impairment   was 2   Steps  Mild Impairment   was 2   Total Score  16                           PT Education - 04/01/19 1546    Education Details  Updated HEP and plan for DC.    Person(s) Educated  Patient    Methods  Explanation;Handout    Comprehension  Verbalized understanding       PT Short Term Goals - 04/01/19 1548      PT SHORT TERM GOAL #  1   Title  Pt will have improved MMT by 1/2 grade throughout proximal hip mm in order to maximize gait and balance.    Baseline  7/30- muscles not affeccted by neuro syndrome improving in strength (flowsheets)    Time  3    Period  Weeks    Status  Achieved      PT SHORT TERM GOAL #2   Title   Pt will have improved 30sec chair rise test to 13x to demo improved functional strength and balance.    Baseline  --    Time  3    Period  Weeks    Status  Achieved      PT SHORT TERM GOAL #3   Title  Pt will have improved DGI score to 16/24 to demo improved dynamic balance with gait and reduce her risk for falling.    Baseline  --    Time  3    Period  Weeks    Status  Achieved        PT Long Term Goals - 04/01/19 1550      PT LONG TERM GOAL #1   Title  Pt will have improved MMT by 1 grade throughout proximal hip mm in order to further maximize gait and balance.    Baseline  9/3- flowsheets    Time  6    Period  Weeks    Status  On-going      PT LONG TERM GOAL #2   Title  Pt will have improved L SLS to 10sec without UE and improved R SLS to 3sec without UE in order to demo improved functional hip and core strength and to maximize her gait.    Baseline  9/3- objective measures    Time  6    Period  Weeks    Status  On-going      PT LONG TERM GOAL #3   Title  Patient to be 1546f with B SPCs at self-selected pace without rest breaks or fatigue in order to improve ability to access trail heads and enjoy vacations with family    Baseline  7/30- 3735ftoday, 40675ft best and revised goal to distance rather than gait speed    Time  6    Period  Weeks    Status  Deferred      PT LONG TERM GOAL #4   Title  Pt will report being able to stand for 10 mins without UE support to demo improved BLE strength, endurance, and balance in order to allow her to stand and perform self-hygiene and prepare a small meal in the kitchen with greater ease.    Baseline  7/30- not tested today    Time  6    Period  Weeks    Status  Achieved            Plan - 04/01/19 1557    Clinical Impression Statement  Performed re-assessment of patient's progress towards goals. Patient had achieved many goals, but was ongoing with some strengthening goals and balance goals. Patient requesting discharge  at this time as she feels ready to continue exercises on her own at home. Consenting to this plan as feel that patient is competent and safe to continue progressing independently at this point. Updated patient's HEP. Patient is being discharged at this time as she is independent with HEP.    Personal Factors and Comorbidities  Age;Comorbidity 3+;Past/Current Experience;Time since onset of injury/illness/exacerbation    Comorbidities  see above    Examination-Activity Limitations  Locomotion Level;Stand;Sleep    Examination-Participation Restrictions  Community Activity;Yard Work    Stability/Clinical Decision Making  Stable/Uncomplicated    Rehab Potential  Good    PT Frequency  2x / week    PT Duration  6 weeks    PT Treatment/Interventions  ADLs/Self Care Home Management;Aquatic Therapy;Cryotherapy;Electrical Stimulation;Moist Heat;DME Instruction;Gait training;Stair training;Functional mobility training;Therapeutic activities;Therapeutic exercise;Balance training;Neuromuscular re-education;Patient/family education;Orthotic Fit/Training;Manual techniques;Wheelchair mobility training;Passive range of motion;Scar mobilization;Dry needling;Energy conservation;Taping    PT Next Visit Plan  DC    PT Home Exercise Plan  eval: bridging, STS; 6/25: supine clam RTB, sidelying reverse clams with husband's assistance; 7/7: bridge (push through heels cues), mini squat, wall squat, hip hikes on step, tandem stance with support surface close by if needed for any exercise; 7/22 - hip ext/abd with red tb at knees in standing; 8/27- side stesps with green TB around forefeet, partial squats in front of chair with green TB and manual help from husband; 04/01/19: bridge with ball squeeze 1x/day    Consulted and Agree with Plan of Care  Patient       Patient will benefit from skilled therapeutic intervention in order to improve the following deficits and impairments:  Abnormal gait, Decreased activity tolerance,  Decreased balance, Decreased endurance, Decreased mobility, Decreased strength, Difficulty walking, Hypomobility, Increased fascial restricitons, Increased muscle spasms, Impaired flexibility, Improper body mechanics, Pain  Visit Diagnosis: Muscle weakness (generalized)  Difficulty in walking, not elsewhere classified  Other symptoms and signs involving the musculoskeletal system  Other symptoms and signs involving the nervous system     Problem List Patient Active Problem List   Diagnosis Date Noted  . Postmenopausal 02/24/2018  . Pain syndrome, chronic 08/06/2015  . Lower extremity pain, left 04/12/2015  . Osteoporosis 11/21/2014  . Breast cancer of upper-outer quadrant of left female breast ([place]) 07/11/2014  . Abscess of axilla, left 06/27/2011  . HYPERLIPIDEMIA 01/26/2008  . REFLEX SYMPATHETIC DYSTROPHY 01/26/2008  . NEUROPATHY 01/26/2008  . HEMORRHOIDS, INTERNAL 01/26/2008  . ESOPHAGEAL STRICTURE 01/26/2008  . GERD 01/26/2008  . BARRETTS ESOPHAGUS 01/26/2008  . DIVERTICULOSIS, COLON 01/26/2008  . IRRITABLE BOWEL SYNDROME 01/26/2008  . SCOLIOSIS 01/26/2008  . HEPATITIS C, HX OF 01/26/2008  . MITRAL VALVE PROLAPSE, HX OF 01/26/2008   Clarene Critchley PT, DPT 4:00 PM, 04/01/19 [facility] [address] [place], [place], [address] Phone: [phone]   Fax:  [phone]  Name: Amanda Terrell MRN: [id number] Date of Birth: 1956/03/04

## 2019-04-07 ENCOUNTER — Ambulatory Visit (HOSPITAL_COMMUNITY): Payer: BC Managed Care – PPO | Admitting: Physical Therapy

## 2019-04-09 ENCOUNTER — Encounter (HOSPITAL_COMMUNITY): Payer: BC Managed Care – PPO | Admitting: Physical Therapy

## 2019-04-13 ENCOUNTER — Encounter (HOSPITAL_COMMUNITY): Payer: BC Managed Care – PPO | Admitting: Physical Therapy

## 2019-04-15 ENCOUNTER — Encounter (HOSPITAL_COMMUNITY): Payer: BC Managed Care – PPO | Admitting: Physical Therapy

## 2019-05-11 DIAGNOSIS — L97312 Non-pressure chronic ulcer of right ankle with fat layer exposed: Secondary | ICD-10-CM | POA: Diagnosis not present

## 2019-05-27 DIAGNOSIS — M129 Arthropathy, unspecified: Secondary | ICD-10-CM | POA: Diagnosis not present

## 2019-05-27 DIAGNOSIS — G629 Polyneuropathy, unspecified: Secondary | ICD-10-CM | POA: Diagnosis not present

## 2019-05-27 DIAGNOSIS — E559 Vitamin D deficiency, unspecified: Secondary | ICD-10-CM | POA: Diagnosis not present

## 2019-05-27 DIAGNOSIS — Z79899 Other long term (current) drug therapy: Secondary | ICD-10-CM | POA: Diagnosis not present

## 2019-05-27 DIAGNOSIS — G894 Chronic pain syndrome: Secondary | ICD-10-CM | POA: Diagnosis not present

## 2019-06-08 DIAGNOSIS — L97312 Non-pressure chronic ulcer of right ankle with fat layer exposed: Secondary | ICD-10-CM | POA: Diagnosis not present

## 2019-06-28 DIAGNOSIS — Z79899 Other long term (current) drug therapy: Secondary | ICD-10-CM | POA: Diagnosis not present

## 2019-06-28 DIAGNOSIS — G8929 Other chronic pain: Secondary | ICD-10-CM | POA: Diagnosis not present

## 2019-06-28 DIAGNOSIS — M545 Low back pain: Secondary | ICD-10-CM | POA: Diagnosis not present

## 2019-06-28 DIAGNOSIS — G894 Chronic pain syndrome: Secondary | ICD-10-CM | POA: Diagnosis not present

## 2019-06-29 DIAGNOSIS — L97312 Non-pressure chronic ulcer of right ankle with fat layer exposed: Secondary | ICD-10-CM | POA: Diagnosis not present

## 2019-07-01 DIAGNOSIS — H5201 Hypermetropia, right eye: Secondary | ICD-10-CM | POA: Diagnosis not present

## 2019-07-13 DIAGNOSIS — L97312 Non-pressure chronic ulcer of right ankle with fat layer exposed: Secondary | ICD-10-CM | POA: Diagnosis not present

## 2019-07-16 ENCOUNTER — Other Ambulatory Visit (HOSPITAL_COMMUNITY): Payer: Self-pay | Admitting: Podiatry

## 2019-07-16 ENCOUNTER — Other Ambulatory Visit: Payer: Self-pay | Admitting: Podiatry

## 2019-08-09 ENCOUNTER — Encounter (HOSPITAL_COMMUNITY): Admission: RE | Admit: 2019-08-09 | Payer: BC Managed Care – PPO | Source: Ambulatory Visit

## 2019-08-09 ENCOUNTER — Other Ambulatory Visit (HOSPITAL_COMMUNITY): Payer: BC Managed Care – PPO

## 2019-08-12 DIAGNOSIS — Z20828 Contact with and (suspected) exposure to other viral communicable diseases: Secondary | ICD-10-CM | POA: Diagnosis not present

## 2019-08-19 ENCOUNTER — Ambulatory Visit: Payer: Medicare Other | Attending: Internal Medicine

## 2019-08-19 DIAGNOSIS — Z23 Encounter for immunization: Secondary | ICD-10-CM | POA: Insufficient documentation

## 2019-08-19 NOTE — Progress Notes (Signed)
   Covid-19 Vaccination Clinic  Name:  Amanda Terrell    MRN: [id number] DOB: 1956-02-15  08/19/2019  Amanda Terrell was observed post Covid-19 immunization for 15 minutes without incidence. She was provided with Vaccine Information Sheet and instruction to access the V-Safe system.   Amanda Terrell was instructed to call 911 with any severe reactions post vaccine: Marland Kitchen Difficulty breathing  . Swelling of your face and throat  . A fast heartbeat  . A bad rash all over your body  . Dizziness and weakness    Immunizations Administered    Name Date Dose VIS Date Route   Pfizer COVID-19 Vaccine 08/19/2019  6:22 PM 0.3 mL 07/09/2019 Intramuscular   Manufacturer: [place]   Lot: [id number]   [place]: [id number]

## 2019-08-31 ENCOUNTER — Ambulatory Visit (HOSPITAL_COMMUNITY): Payer: Medicare Other

## 2019-08-31 ENCOUNTER — Encounter (HOSPITAL_COMMUNITY): Payer: Medicare Other

## 2019-08-31 ENCOUNTER — Encounter (HOSPITAL_COMMUNITY): Payer: BC Managed Care – PPO

## 2019-09-02 NOTE — Patient Instructions (Signed)
Amanda Terrell  09/02/2019     @PREFPERIOPPHARMACY @   Your procedure is scheduled on  09/08/2019  Report to [facility] at [place].M.  Call this number if you have problems the morning of surgery:  [phone]   Remember:  Do not eat or drink after midnight.                       Take these medicines the morning of surgery with A SIP OF WATER  gabapentin    Do not wear jewelry, make-up or nail polish.  Do not wear lotions, powders, or perfumes, or deodorant.  Do not shave 48 hours prior to surgery.  Men may shave face and neck.  Do not bring valuables to the hospital.  [facility] is not responsible for any belongings or valuables.  Contacts, dentures or bridgework may not be worn into surgery.  Leave your suitcase in the car.  After surgery it may be brought to your room.  For patients admitted to the hospital, discharge time will be determined by your treatment team.  Patients discharged the day of surgery will not be allowed to drive home.   Name and phone number of your driver:   family Special instructions:  None  Please read over the following fact sheets that you were given. Anesthesia Post-op Instructions and Care and Recovery After Surgery       Ostectomy of the Foot, Care After This sheet gives you information about how to care for yourself after your procedure. Your health care provider may also give you more specific instructions. If you have problems or questions, contact your health care provider. What can I expect after the procedure? After the procedure, it is common to have:  Pain.  Swelling.  Stiffness. Follow these instructions at home: If you have a splint or supportive shoe:  Wear the splint or shoe as told by your health care provider. Remove it only as told by your health care provider.  Loosen the splint or shoe if your toes tingle, become numb, or turn cold and blue.  Keep the splint or shoe clean.  If the splint or shoe is  not waterproof: ? Do not let it get wet. ? Cover it with a watertight covering when you take a bath or a shower. If you have a cast:  Do not stick anything inside the cast to scratch your skin. Doing that increases your risk of infection.  Check the skin around the cast every day. Tell your health care provider about any concerns.  You may put lotion on dry skin around the edges of the cast. Do not put lotion on the skin underneath the cast.  Keep the cast clean.  If the cast is not waterproof: ? Do not let it get wet. ? Cover it with a watertight covering when you take a bath or a shower. Bathing  Do not take baths, swim, or use a hot tub until your health care provider approves. Ask your health care provider if you may take showers. You may only be allowed to take sponge baths for bathing.  If your cast or splint is not waterproof, cover it with a watertight covering when you take a bath or a shower.  Keep the bandage (dressing) dry until your health care provider says it can be removed. Incision care   Follow instructions from your health care provider about how to take care  of your incision. Make sure you: ? Wash your hands with soap and water before you change your bandage (dressing). If soap and water are not available, use hand sanitizer. ? Change your dressing as told by your health care provider. ? Leave stitches (sutures), skin glue, or adhesive strips in place. These skin closures may need to stay in place for 2 weeks or longer. If adhesive strip edges start to loosen and curl up, you may trim the loose edges. Do not remove adhesive strips completely unless your health care provider tells you to do that.  Check your incision area every day for signs of infection. Check for: ? Redness, swelling, or pain. ? Fluid or blood. ? Warmth. ? Pus or a bad smell. Managing pain, stiffness, and swelling   If directed, put ice on the affected area. ? If you have a removable  splint or shoe, remove it as told by your health care provider. ? Put ice in a plastic bag. ? Place a towel between your skin and the bag or between your cast and the bag. ? Leave the ice on for 20 minutes, 2-3 times a day.  Move your foot and toes often to avoid stiffness and to lessen swelling.  Raise (elevate) your foot area above the level of your heart while you are sitting or lying down. Driving  Do not drive or use heavy machinery while taking prescription pain medicine.  Ask your health care provider when it is safe to drive if you have a cast, splint, or supportive shoe on your foot. Activity  Return to your normal activities as told by your health care provider. Ask your health care provider what activities are safe for you.  Do exercises as told by your health care provider. Safety  Do not use your foot to support your body weight until your health care provider says that you can.  Use your crutches or walker as told by your health care provider. General instructions  Do not put pressure on any part of the cast or splint until it is fully hardened. This may take several hours.  Do not use any products that contain nicotine or tobacco, such as cigarettes and e-cigarettes. These can delay bone healing. If you need help quitting, ask your health care provider.  Take over-the-counter and prescription medicines only as told by your health care provider.  Keep all follow-up visits as told by your health care provider. This is important. Contact a health care provider if:  You have chills or a fever.  Your pain is not controlled by your pain medicine.  You have redness, swelling, or pain around your incision.  You have fluid or blood coming from your incision.  Your incision feels warm to the touch.  You have pus or a bad smell coming from your incision. Get help right away if:  You have severe pain.  You have new pain, warmth, and swelling in your leg.  You  have chest pain or difficulty breathing. Summary  After the procedure, it is common to have some pain, swelling, and stiffness.  Follow instructions from your health care provider about how to take care of your incision. Check the incision area every day for signs of infection.  Do not use your foot to support your body weight until your health care provider says that you can.  Move your foot and toes often to avoid stiffness and to lessen swelling. This information is not intended to replace advice given  to you by your health care provider. Make sure you discuss any questions you have with your health care provider. Document Revised: 09/10/2018 Document Reviewed: 09/03/2016 Elsevier Patient Education  [place] After These instructions provide you with information about caring for yourself after your procedure. Your health care provider may also give you more specific instructions. Your treatment has been planned according to current medical practices, but problems sometimes occur. Call your health care provider if you have any problems or questions after your procedure. What can I expect after the procedure? After your procedure, you may:  Feel sleepy for several hours.  Feel clumsy and have poor balance for several hours.  Feel forgetful about what happened after the procedure.  Have poor judgment for several hours.  Feel nauseous or vomit.  Have a sore throat if you had a breathing tube during the procedure. Follow these instructions at home: For at least 24 hours after the procedure:      Have a responsible adult stay with you. It is important to have someone help care for you until you are awake and alert.  Rest as needed.  Do not: ? Participate in activities in which you could fall or become injured. ? Drive. ? Use heavy machinery. ? Drink alcohol. ? Take sleeping pills or medicines that cause drowsiness. ? Make important  decisions or sign legal documents. ? Take care of children on your own. Eating and drinking  Follow the diet that is recommended by your health care provider.  If you vomit, drink water, juice, or soup when you can drink without vomiting.  Make sure you have little or no nausea before eating solid foods. General instructions  Take over-the-counter and prescription medicines only as told by your health care provider.  If you have sleep apnea, surgery and certain medicines can increase your risk for breathing problems. Follow instructions from your health care provider about wearing your sleep device: ? Anytime you are sleeping, including during daytime naps. ? While taking prescription pain medicines, sleeping medicines, or medicines that make you drowsy.  If you smoke, do not smoke without supervision.  Keep all follow-up visits as told by your health care provider. This is important. Contact a health care provider if:  You keep feeling nauseous or you keep vomiting.  You feel light-headed.  You develop a rash.  You have a fever. Get help right away if:  You have trouble breathing. Summary  For several hours after your procedure, you may feel sleepy and have poor judgment.  Have a responsible adult stay with you for at least 24 hours or until you are awake and alert. This information is not intended to replace advice given to you by your health care provider. Make sure you discuss any questions you have with your health care provider. Document Revised: 10/13/2017 Document Reviewed: 11/05/2015 Elsevier Patient Education  [place].

## 2019-09-03 DIAGNOSIS — L97312 Non-pressure chronic ulcer of right ankle with fat layer exposed: Secondary | ICD-10-CM | POA: Diagnosis not present

## 2019-09-03 DIAGNOSIS — R600 Localized edema: Secondary | ICD-10-CM | POA: Diagnosis not present

## 2019-09-06 ENCOUNTER — Ambulatory Visit (HOSPITAL_COMMUNITY)
Admission: RE | Admit: 2019-09-06 | Discharge: 2019-09-06 | Disposition: A | Discharge status: Home / Self Care | Payer: Medicare Other | Source: Ambulatory Visit | Attending: Podiatry | Admitting: Podiatry

## 2019-09-06 ENCOUNTER — Other Ambulatory Visit: Payer: Self-pay | Admitting: Podiatry

## 2019-09-06 ENCOUNTER — Other Ambulatory Visit: Payer: Self-pay

## 2019-09-06 ENCOUNTER — Other Ambulatory Visit (HOSPITAL_COMMUNITY)
Admission: RE | Admit: 2019-09-06 | Discharge: 2019-09-06 | Disposition: A | Discharge status: Home / Self Care | Payer: Medicare Other | Source: Ambulatory Visit | Attending: Podiatry | Admitting: Podiatry

## 2019-09-06 ENCOUNTER — Other Ambulatory Visit (HOSPITAL_COMMUNITY): Payer: Self-pay | Admitting: Podiatry

## 2019-09-06 ENCOUNTER — Encounter (HOSPITAL_COMMUNITY)
Admission: RE | Admit: 2019-09-06 | Discharge: 2019-09-06 | Disposition: A | Discharge status: Home / Self Care | Payer: Medicare Other | Source: Ambulatory Visit | Attending: Podiatry | Admitting: Podiatry

## 2019-09-06 DIAGNOSIS — Z20822 Contact with and (suspected) exposure to covid-19: Secondary | ICD-10-CM | POA: Diagnosis not present

## 2019-09-06 DIAGNOSIS — L97319 Non-pressure chronic ulcer of right ankle with unspecified severity: Secondary | ICD-10-CM | POA: Insufficient documentation

## 2019-09-06 DIAGNOSIS — R600 Localized edema: Secondary | ICD-10-CM

## 2019-09-06 DIAGNOSIS — M25571 Pain in right ankle and joints of right foot: Secondary | ICD-10-CM | POA: Diagnosis not present

## 2019-09-06 LAB — BASIC METABOLIC PANEL
Anion gap: 10 (ref 5–15)
BUN: 17 mg/dL (ref 8–23)
CO2: 32 mmol/L (ref 22–32)
Calcium: 9.8 mg/dL (ref 8.9–10.3)
Chloride: 99 mmol/L (ref 98–111)
Creatinine, Ser: 0.61 mg/dL (ref 0.44–1.00)
GFR calc Af Amer: >60 mL/min (ref >60)
GFR calc non Af Amer: >60 mL/min (ref >60)
Glucose, Bld: 108 mg/dL — ABNORMAL HIGH (ref 70–99)
Potassium: 4 mmol/L (ref 3.5–5.1)
Sodium: 141 mmol/L (ref 135–145)

## 2019-09-06 LAB — SARS CORONAVIRUS 2 (TAT 6-24 HRS): SARS Coronavirus 2: NEGATIVE

## 2019-09-07 ENCOUNTER — Ambulatory Visit: Payer: Medicare Other

## 2019-09-08 ENCOUNTER — Ambulatory Visit (HOSPITAL_COMMUNITY): Payer: Medicare Other

## 2019-09-08 ENCOUNTER — Ambulatory Visit (HOSPITAL_COMMUNITY): Payer: Medicare Other | Admitting: Anesthesiology

## 2019-09-08 ENCOUNTER — Encounter (HOSPITAL_COMMUNITY): Payer: Self-pay | Admitting: Podiatry

## 2019-09-08 ENCOUNTER — Encounter (HOSPITAL_COMMUNITY)
Admission: RE | Disposition: A | Discharge status: Home / Self Care | Payer: Self-pay | Source: Home / Self Care | Attending: Podiatry

## 2019-09-08 ENCOUNTER — Ambulatory Visit (HOSPITAL_COMMUNITY)
Admission: RE | Admit: 2019-09-08 | Discharge: 2019-09-08 | Disposition: A | Discharge status: Home / Self Care | Payer: Medicare Other | Attending: Podiatry | Admitting: Podiatry

## 2019-09-08 DIAGNOSIS — L97312 Non-pressure chronic ulcer of right ankle with fat layer exposed: Secondary | ICD-10-CM | POA: Insufficient documentation

## 2019-09-08 DIAGNOSIS — K2270 Barrett's esophagus without dysplasia: Secondary | ICD-10-CM | POA: Insufficient documentation

## 2019-09-08 DIAGNOSIS — M1990 Unspecified osteoarthritis, unspecified site: Secondary | ICD-10-CM | POA: Diagnosis not present

## 2019-09-08 DIAGNOSIS — Z8619 Personal history of other infectious and parasitic diseases: Secondary | ICD-10-CM | POA: Diagnosis not present

## 2019-09-08 DIAGNOSIS — Z853 Personal history of malignant neoplasm of breast: Secondary | ICD-10-CM | POA: Diagnosis not present

## 2019-09-08 DIAGNOSIS — K589 Irritable bowel syndrome without diarrhea: Secondary | ICD-10-CM | POA: Diagnosis not present

## 2019-09-08 DIAGNOSIS — G629 Polyneuropathy, unspecified: Secondary | ICD-10-CM | POA: Diagnosis not present

## 2019-09-08 DIAGNOSIS — I341 Nonrheumatic mitral (valve) prolapse: Secondary | ICD-10-CM | POA: Insufficient documentation

## 2019-09-08 DIAGNOSIS — E785 Hyperlipidemia, unspecified: Secondary | ICD-10-CM | POA: Insufficient documentation

## 2019-09-08 DIAGNOSIS — M899 Disorder of bone, unspecified: Secondary | ICD-10-CM | POA: Diagnosis not present

## 2019-09-08 DIAGNOSIS — Z79899 Other long term (current) drug therapy: Secondary | ICD-10-CM | POA: Insufficient documentation

## 2019-09-08 DIAGNOSIS — Z9889 Other specified postprocedural states: Secondary | ICD-10-CM

## 2019-09-08 DIAGNOSIS — G894 Chronic pain syndrome: Secondary | ICD-10-CM | POA: Insufficient documentation

## 2019-09-08 DIAGNOSIS — K219 Gastro-esophageal reflux disease without esophagitis: Secondary | ICD-10-CM | POA: Diagnosis not present

## 2019-09-08 DIAGNOSIS — M21371 Foot drop, right foot: Secondary | ICD-10-CM | POA: Insufficient documentation

## 2019-09-08 DIAGNOSIS — L97319 Non-pressure chronic ulcer of right ankle with unspecified severity: Secondary | ICD-10-CM | POA: Diagnosis not present

## 2019-09-08 HISTORY — PX: OSTECTOMY: SHX6439

## 2019-09-08 SURGERY — OSTECTOMY
Anesthesia: General | Site: Ankle | Laterality: Right

## 2019-09-08 MED ORDER — PROPOFOL 10 MG/ML IV BOLUS
INTRAVENOUS | Status: DC | PRN
  Administered 2019-09-08 (×2): 20 mg via INTRAVENOUS

## 2019-09-08 MED ORDER — LIDOCAINE HCL (PF) 1 % IJ SOLN
INTRAMUSCULAR | Status: AC
  Filled 2019-09-08: qty 30

## 2019-09-08 MED ORDER — CHLORHEXIDINE GLUCONATE CLOTH 2 % EX PADS: 6.0000 | MEDICATED_PAD | Freq: Once | CUTANEOUS | Status: DC

## 2019-09-08 MED ORDER — HYDROMORPHONE HCL 1 MG/ML IJ SOLN: 0.2500 mg | INTRAMUSCULAR | Status: DC | PRN

## 2019-09-08 MED ORDER — MEPERIDINE HCL 50 MG/ML IJ SOLN: 6.2500 mg | INTRAMUSCULAR | Status: DC | PRN

## 2019-09-08 MED ORDER — BUPIVACAINE HCL (PF) 0.5 % IJ SOLN
INTRAMUSCULAR | Status: AC
  Filled 2019-09-08: qty 30

## 2019-09-08 MED ORDER — MIDAZOLAM HCL 2 MG/2ML IJ SOLN
INTRAMUSCULAR | Status: AC
  Filled 2019-09-08: qty 2

## 2019-09-08 MED ORDER — PROPOFOL 10 MG/ML IV BOLUS
INTRAVENOUS | Status: AC
  Filled 2019-09-08: qty 20

## 2019-09-08 MED ORDER — MIDAZOLAM HCL 5 MG/5ML IJ SOLN
INTRAMUSCULAR | Status: DC | PRN
  Administered 2019-09-08: 2 mg via INTRAVENOUS

## 2019-09-08 MED ORDER — ONDANSETRON HCL 4 MG/2ML IJ SOLN: 4.0000 mg | Freq: Once | INTRAMUSCULAR | Status: DC | PRN

## 2019-09-08 MED ORDER — PROPOFOL 10 MG/ML IV BOLUS
INTRAVENOUS | Status: AC
  Filled 2019-09-08: qty 40

## 2019-09-08 MED ORDER — LACTATED RINGERS IV SOLN: Freq: Once | INTRAVENOUS | Status: AC

## 2019-09-08 MED ORDER — LACTATED RINGERS IV SOLN: INTRAVENOUS | Status: DC | PRN

## 2019-09-08 MED ORDER — FENTANYL CITRATE (PF) 100 MCG/2ML IJ SOLN
INTRAMUSCULAR | Status: AC
  Filled 2019-09-08: qty 2

## 2019-09-08 MED ORDER — SODIUM CHLORIDE 0.9 % IR SOLN
Status: DC | PRN
  Administered 2019-09-08: 1000 mL

## 2019-09-08 MED ORDER — CEFAZOLIN SODIUM-DEXTROSE 2-4 GM/100ML-% IV SOLN
2.0000 g | INTRAVENOUS | Status: AC
  Administered 2019-09-08: 2 g via INTRAVENOUS

## 2019-09-08 MED ORDER — PROPOFOL 500 MG/50ML IV EMUL
INTRAVENOUS | Status: DC | PRN
  Administered 2019-09-08: 75 ug/kg/min via INTRAVENOUS

## 2019-09-08 MED ORDER — CEFAZOLIN SODIUM-DEXTROSE 2-4 GM/100ML-% IV SOLN
INTRAVENOUS | Status: AC
  Filled 2019-09-08: qty 100

## 2019-09-08 MED ORDER — BUPIVACAINE HCL (PF) 0.5 % IJ SOLN
INTRAMUSCULAR | Status: DC | PRN
  Administered 2019-09-08: 20 mL

## 2019-09-08 MED ORDER — FENTANYL CITRATE (PF) 100 MCG/2ML IJ SOLN
INTRAMUSCULAR | Status: DC | PRN
  Administered 2019-09-08 (×2): 25 ug via INTRAVENOUS

## 2019-09-08 SURGICAL SUPPLY — 52 items
APL SKNCLS STERI-STRIP NONHPOA (GAUZE/BANDAGES/DRESSINGS) ×1
BANDAGE ELASTIC 3 LF NS (GAUZE/BANDAGES/DRESSINGS) ×2 IMPLANT
BANDAGE ESMARK 4X12 BL STRL LF (DISPOSABLE) ×1 IMPLANT
BENZOIN TINCTURE PRP APPL 2/3 (GAUZE/BANDAGES/DRESSINGS) ×3 IMPLANT
BLADE OSC/SAGITTAL MD 5.5X18 (BLADE) ×3 IMPLANT
BLADE SURG 15 STRL LF DISP TIS (BLADE) ×2 IMPLANT
BLADE SURG 15 STRL SS (BLADE) ×6
BNDG CMPR 12X4 ELC STRL LF (DISPOSABLE) ×1
BNDG CMPR MED 5X3 ELC HKLP NS (GAUZE/BANDAGES/DRESSINGS) ×1
BNDG CMPR STD VLCR NS LF 5.8X4 (GAUZE/BANDAGES/DRESSINGS) ×1
BNDG CONFORM 2 STRL LF (GAUZE/BANDAGES/DRESSINGS) ×3 IMPLANT
BNDG CONFORM 3 STRL LF (GAUZE/BANDAGES/DRESSINGS) ×2 IMPLANT
BNDG ELASTIC 4X5.8 VLCR NS LF (GAUZE/BANDAGES/DRESSINGS) ×3 IMPLANT
BNDG ESMARK 4X12 BLUE STRL LF (DISPOSABLE) ×3
BNDG GAUZE ELAST 4 BULKY (GAUZE/BANDAGES/DRESSINGS) ×3 IMPLANT
BOOT STEPPER DURA MED (SOFTGOODS) ×2 IMPLANT
CLOSURE WOUND 1/2 X4 (GAUZE/BANDAGES/DRESSINGS) ×2
CLOTH BEACON ORANGE TIMEOUT ST (SAFETY) ×3 IMPLANT
COVER LIGHT HANDLE STERIS (MISCELLANEOUS) ×6 IMPLANT
COVER MAYO STAND XLG (MISCELLANEOUS) ×2 IMPLANT
COVER WAND RF STERILE (DRAPES) ×6 IMPLANT
CUFF TOURN SGL QUICK 18X4 (TOURNIQUET CUFF) ×2 IMPLANT
DECANTER SPIKE VIAL GLASS SM (MISCELLANEOUS) ×3 IMPLANT
DRAPE OEC MINIVIEW 54X84 (DRAPES) ×3 IMPLANT
DRSG ADAPTIC 3X8 NADH LF (GAUZE/BANDAGES/DRESSINGS) ×3 IMPLANT
ELECT REM PT RETURN 9FT ADLT (ELECTROSURGICAL) ×3
ELECTRODE REM PT RTRN 9FT ADLT (ELECTROSURGICAL) ×1 IMPLANT
GAUZE SPONGE 4X4 12PLY STRL (GAUZE/BANDAGES/DRESSINGS) ×3 IMPLANT
GLOVE BIO SURGEON STRL SZ7 (GLOVE) ×5 IMPLANT
GLOVE BIO SURGEON STRL SZ7.5 (GLOVE) ×3 IMPLANT
GLOVE BIOGEL PI IND STRL 7.0 (GLOVE) ×2 IMPLANT
GLOVE BIOGEL PI INDICATOR 7.0 (GLOVE) ×4
GLOVE ECLIPSE 7.0 STRL STRAW (GLOVE) ×3 IMPLANT
GOWN STRL REUS W/ TWL LRG LVL3 (GOWN DISPOSABLE) ×1 IMPLANT
GOWN STRL REUS W/TWL LRG LVL3 (GOWN DISPOSABLE) ×9 IMPLANT
MANIFOLD NEPTUNE II (INSTRUMENTS) ×3 IMPLANT
NDL HYPO 27GX1-1/4 (NEEDLE) ×3 IMPLANT
NEEDLE HYPO 27GX1-1/4 (NEEDLE) ×9 IMPLANT
NS IRRIG 1000ML POUR BTL (IV SOLUTION) ×3 IMPLANT
PACK BASIC LIMB (CUSTOM PROCEDURE TRAY) ×3 IMPLANT
PAD ABD 5X9 TENDERSORB (GAUZE/BANDAGES/DRESSINGS) ×2 IMPLANT
PAD ARMBOARD 7.5X6 YLW CONV (MISCELLANEOUS) ×3 IMPLANT
RASP SM TEAR CROSS CUT (RASP) ×2 IMPLANT
SET BASIN LINEN APH (SET/KITS/TRAYS/PACK) ×3 IMPLANT
SPONGE LAP 18X18 RF (DISPOSABLE) ×3 IMPLANT
STRIP CLOSURE SKIN 1/2X4 (GAUZE/BANDAGES/DRESSINGS) ×4 IMPLANT
SUT BONE WAX W31G (SUTURE) ×2 IMPLANT
SUT ETHILON 4 0 P 3 18 (SUTURE) ×3 IMPLANT
SUT PROLENE 4 0 PS 2 18 (SUTURE) ×4 IMPLANT
SUT VIC AB 4-0 PS2 27 (SUTURE) IMPLANT
SUT VICRYL AB 3-0 FS1 BRD 27IN (SUTURE) IMPLANT
SYR CONTROL 10ML LL (SYRINGE) ×6 IMPLANT

## 2019-09-08 NOTE — Op Note (Signed)
OPERATIVE NOTE  DATE OF PROCEDURE 09/08/2019  Bigelow, DPM  ASSISTANT SURGEON Jilda Panda, DPM  OR STAFF Circulator: Cox, Gershon Mussel, RN Scrub Person: Karin Lieu, CST Circulator Assistant: Glory Rosebush, RN   PREOPERATIVE DIAGNOSIS 1.  Chronic ulceration, right ankle 2.  Foot drop, right  POSTOPERATIVE DIAGNOSIS Same  PROCEDURE 1.  Ostectomy distal fibula, right ankle 2.  Debridement of ulceration, right ankle  ANESTHESIA Monitor Anesthesia Care   HEMOSTASIS Pneumatic ankle tourniquet set at 250 mmHg  ESTIMATED BLOOD LOSS Minimal (<5 cc)  MATERIALS USED Bone wax  INJECTABLES 0.5% Marcaine plain  PATHOLOGY 1.  Bone from right distal fibula to pathology 2.  Soft tissue from right ankle to microbiology for aerobic and anaerobic culture  COMPLICATIONS None  INDICATIONS: Chronic ulceration of the right foot that has failed to remain closed with local wound care.  DESCRIPTION OF THE PROCEDURE:  The patient was brought to the operating room and placed on the operative table in the supine position.  A pneumatic ankle tourniquet was applied to the operative extremity superior to the planned surgical site.  Following sedation, the surgical site was anesthetized with 0.5% Marcaine plain.  The foot was then prepped, scrubbed, and draped in the usual sterile technique.  The foot was elevated, exsanguinated and the pneumatic ankle tourniquet inflated to 250 mmHg.    Attention was directed to the lateral aspect of the right ankle.  A full-thickness ulceration was noted overlying the lateral malleolus.  The ulceration measured 1.3 x 1.2 x 0.1 cm.  The wound bed was comprised of pink-red viable tissue.  The periwound was hyperkeratotic and scaling.  Using a #15 blade a full-thickness excisional debridement of the ulceration was performed.  The nonviable hyperkeratotic tissue was debrided and removed.  The wound bed was debrided using a curette down to  and including subcutaneous tissue.  The tissue was sent to microbiology for aerobic and anaerobic culture and sensitivity.  Following debridement the ulceration measures 1.6 x 1.5 x 0.2 cm.  A curvilinear incision was made at anterior to the ulceration.  Dissection was continued deep down to the level of the lateral malleolus.  A linear periosteal incision was performed.  The periosteum was reflected exposing the lateral malleolus.  The peroneal tendons were identified and retracted posteriorly.  Using a power bone saw the lateral portion of the lateral malleolus was resected and passed from the operative field.  The bone was inspected.  No erosive changes consistent with osteomyelitis were identified.  The bone was sent to pathology for evaluation.  All rough edges were smoothed with a power rasp.  Bone wax was applied over the exposed cancellous bone.  The periosteal and subcutaneous structures were reapproximated using 4-0 Vicryl.  The skin was reapproximated using 4-0 Prolene.  A sterile compressive dressing was applied to the right ankle.  The pneumatic ankle tourniquet was done and a prompt hyperemic response was noted to all digits of the right foot.  The patient tolerated the procedure well.  The patient was then transferred to PACU with vital signs stable and vascular status intact to all toes of the operative foot.

## 2019-09-08 NOTE — Transfer of Care (Signed)
Immediate Anesthesia Transfer of Care Note  Patient: Amanda Terrell  Procedure(s) Performed: OSTECTOMY OF DISTAL FIBULA RIGHT ANKLE AND DEBRIDEMENT OF ULCERATION RIGHT ANKLE (Right Ankle)  Patient Location: PACU  Anesthesia Type:General  Level of Consciousness: awake  Airway & Oxygen Therapy: Patient Spontanous Breathing  Post-op Assessment: Report given to RN  Post vital signs: Reviewed  Last Vitals:  Vitals Value Taken Time  BP 109/77 09/08/19 0947  Temp    Pulse 81 09/08/19 0950  Resp 16 09/08/19 0950  SpO2 94 % 09/08/19 0950  Vitals shown include unvalidated device data.  Last Pain:  Vitals:   09/08/19 0741  TempSrc: Oral  PainSc: 5       Patients Stated Pain Goal: 7 ([id number])  Complications: No apparent anesthesia complications

## 2019-09-08 NOTE — Anesthesia Preprocedure Evaluation (Signed)
Anesthesia Evaluation  Patient identified by MRN, date of birth, ID band Patient awake    Reviewed: Allergy & Precautions, NPO status , Patient's Chart, lab work & pertinent test results  History of Anesthesia Complications (+) PONV and history of anesthetic complications (PONV - after emergency c cection, not with other surgeries)  Airway Mallampati: II  TM Distance: >3 FB Neck ROM: Full    Dental  (+) Dental Advisory Given, Caps Crown :   Pulmonary neg pulmonary ROS,    Pulmonary exam normal breath sounds clear to auscultation       Cardiovascular Exercise Tolerance: Good + Valvular Problems/Murmurs MVP  Rhythm:Regular Rate:Normal     Neuro/Psych  Neuromuscular disease (right foot drop after back sx) negative psych ROS   GI/Hepatic hiatal hernia, GERD  Medicated and Controlled,(+) Hepatitis -, C  Endo/Other    Renal/GU      Musculoskeletal  (+) Arthritis , Osteoarthritis,  Spinal fusion   Abdominal   Peds  Hematology   Anesthesia Other Findings HYPERLIPIDEMIA REFLEX SYMPATHETIC DYSTROPHY NEUROPATHY HEMORRHOIDS, INTERNAL ESOPHAGEAL STRICTURE GERD BARRETTS ESOPHAGUS DIVERTICULOSIS, COLON IRRITABLE BOWEL SYNDROME SCOLIOSIS HEPATITIS C, HX OF MITRAL VALVE PROLAPSE, HX OF Abscess of axilla, left Breast cancer of upper-outer quadrant of left female breast (HCC) Osteoporosis Lower extremity pain, left Pain syndrome, chronic Postmenopausal    Reproductive/Obstetrics negative OB ROS                             Anesthesia Physical Anesthesia Plan  ASA: II  Anesthesia Plan: General   Post-op Pain Management:    Induction: Intravenous  PONV Risk Score and Plan: 3 and Midazolam, TIVA and Ondansetron  Airway Management Planned: Natural Airway, Simple Face Mask and Nasal Cannula  Additional Equipment:   Intra-op Plan:   Post-operative Plan:   Informed Consent: I have  reviewed the patients History and Physical, chart, labs and discussed the procedure including the risks, benefits and alternatives for the proposed anesthesia with the patient or authorized representative who has indicated his/her understanding and acceptance.     Dental advisory given  Plan Discussed with: CRNA  Anesthesia Plan Comments:         Anesthesia Quick Evaluation

## 2019-09-08 NOTE — Anesthesia Postprocedure Evaluation (Signed)
Anesthesia Post Note  Patient: Amanda Terrell  Procedure(s) Performed: OSTECTOMY OF DISTAL FIBULA RIGHT ANKLE AND DEBRIDEMENT OF ULCERATION RIGHT ANKLE (Right Ankle)  Patient location during evaluation: PACU Anesthesia Type: General Level of consciousness: awake and alert and oriented Pain management: satisfactory to patient Vital Signs Assessment: post-procedure vital signs reviewed and stable Respiratory status: spontaneous breathing Cardiovascular status: blood pressure returned to baseline and stable Postop Assessment: no apparent nausea or vomiting Anesthetic complications: no     Last Vitals:  Vitals:   09/08/19 0947 09/08/19 1000  BP: 109/77 115/75  Pulse: 86 78  Resp: 18 15  Temp: 36.5 C   SpO2: 95% 93%    Last Pain:  Vitals:   09/08/19 0947  TempSrc:   PainSc: 2                  [name]

## 2019-09-08 NOTE — Discharge Instructions (Signed)
These instructions will give you an idea of what to expect after surgery and how to manage issues that may arise before your first post op office visit.  Pain Management Pain is best managed by "staying ahead" of it. If pain gets out of control, it is difficult to get it back under control. Local anesthesia that lasts 6-8 hours is used to numb the foot and decrease pain.  For the best pain control, take the pain medication every 4 hours for the first 2 days post op. On the third day pain medication can be taken as needed.   Post Op Nausea Nausea is common after surgery, so it is managed proactively.  If prescribed, use the prescribed nausea medication regularly for the first 2 days post op.  Bandages Do not worry if there is blood on the bandage. What looks like a lot of blood on the bandage is actually a small amount. Blood on the dressing spreads out as it is absorbed by the gauze, the same way a drop of water spreads out on a paper towel.  If the bandages feel wet or dry, stiff and uncomfortable, call the office during office hours and we will schedule a time for you to have the bandage changed.  Unless you are specifically told otherwise, we will do the first bandage change in the office.  Keep your bandage dry. If the bandage becomes wet or soiled, notify the office and we will schedule a time to change the bandage.  Activity It is best to spend most of the first 2 days after surgery lying down with the foot elevated above the level of your heart. You may put weight on your heel while wearing the CAM Walker (black boot).   You may only get up to go to the restroom.  Driving Do not drive until you are able to respond in an emergency (i.e. slam on the brakes). This usually occurs after the bone has healed - 6 to 8 weeks.  Call the Office If you have a fever over 101F.  If you have increasing pain after the initial post op pain has settled down.  If you have increasing redness, swelling,  or drainage.  If you have any questions or concerns.    General Anesthesia, Adult, Care After This sheet gives you information about how to care for yourself after your procedure. Your health care provider may also give you more specific instructions. If you have problems or questions, contact your health care provider. What can I expect after the procedure? After the procedure, the following side effects are common:  Pain or discomfort at the IV site.  Nausea.  Vomiting.  Sore throat.  Trouble concentrating.  Feeling cold or chills.  Weak or tired.  Sleepiness and fatigue.  Soreness and body aches. These side effects can affect parts of the body that were not involved in surgery. Follow these instructions at home:  For at least 24 hours after the procedure:  Have a responsible adult stay with you. It is important to have someone help care for you until you are awake and alert.  Rest as needed.  Do not: ? Participate in activities in which you could fall or become injured. ? Drive. ? Use heavy machinery. ? Drink alcohol. ? Take sleeping pills or medicines that cause drowsiness. ? Make important decisions or sign legal documents. ? Take care of children on your own. Eating and drinking  Follow any instructions from your health care provider  provider about eating or drinking restrictions.  When you feel hungry, start by eating small amounts of foods that are soft and easy to digest (bland), such as toast. Gradually return to your regular diet.  Drink enough fluid to keep your urine pale yellow.  If you vomit, rehydrate by drinking water, juice, or clear broth. General instructions  If you have sleep apnea, surgery and certain medicines can increase your risk for breathing problems. Follow instructions from your health care provider about wearing your sleep device: ? Anytime you are sleeping, including during daytime naps. ? While taking prescription pain medicines,  sleeping medicines, or medicines that make you drowsy.  Return to your normal activities as told by your health care provider. Ask your health care provider what activities are safe for you.  Take over-the-counter and prescription medicines only as told by your health care provider.  If you smoke, do not smoke without supervision.  Keep all follow-up visits as told by your health care provider. This is important. Contact a health care provider if:  You have nausea or vomiting that does not get better with medicine.  You cannot eat or drink without vomiting.  You have pain that does not get better with medicine.  You are unable to pass urine.  You develop a skin rash.  You have a fever.  You have redness around your IV site that gets worse. Get help right away if:  You have difficulty breathing.  You have chest pain.  You have blood in your urine or stool, or you vomit blood. Summary  After the procedure, it is common to have a sore throat or nausea. It is also common to feel tired.  Have a responsible adult stay with you for the first 24 hours after general anesthesia. It is important to have someone help care for you until you are awake and alert.  When you feel hungry, start by eating small amounts of foods that are soft and easy to digest (bland), such as toast. Gradually return to your regular diet.  Drink enough fluid to keep your urine pale yellow.  Return to your normal activities as told by your health care provider. Ask your health care provider what activities are safe for you. This information is not intended to replace advice given to you by your health care provider. Make sure you discuss any questions you have with your health care provider. Document Revised: 07/18/2017 Document Reviewed: 02/28/2017 Elsevier Patient Education  2020 [employer]  

## 2019-09-08 NOTE — Brief Op Note (Signed)
BRIEF OPERATIVE NOTE  DATE OF PROCEDURE 09/08/2019  SURGEON Marcheta Grammes, DPM  ASSISTANT SURGEON Jilda Panda, DPM  OR STAFF Circulator: Cox, Gershon Mussel, RN Scrub Person: Karin Lieu, CST Circulator Assistant: Glory Rosebush, RN   PREOPERATIVE DIAGNOSIS 1.  Chronic ulceration, right ankle 2.  Foot drop, right  POSTOPERATIVE DIAGNOSIS Same  PROCEDURE 1.  Ostectomy distal fibula, right ankle 2.  Debridement of ulceration, right ankle  ANESTHESIA Monitor Anesthesia Care   HEMOSTASIS Pneumatic ankle tourniquet set at 250 mmHg  ESTIMATED BLOOD LOSS Minimal (<5 cc)  MATERIALS USED Bone wax  INJECTABLES 0.5% Marcaine plain  PATHOLOGY 1.  Bone from right distal fibula to pathology 2.  Soft tissue from right ankle to microbiology for aerobic and anaerobic culture  COMPLICATIONS None

## 2019-09-08 NOTE — H&P (Signed)
HISTORY AND PHYSICAL INTERVAL NOTE:  09/08/2019  8:18 AM  Amanda Terrell  has presented today for surgery, with the diagnosis of right ankle ulcer.  The various methods of treatment have been discussed with the patient.  No guarantees were given.  After consideration of risks, benefits and other options for treatment, the patient has consented to surgery.  I have reviewed the patients' chart and labs.    Patient Vitals for the past 24 hrs:  BP Temp Temp src Pulse Resp SpO2 Height Weight  09/08/19 0741 116/71 98 F (36.7 C) Oral 87 18 94 % 5\' 6"  (1.676 m) 68 kg    A history and physical examination was performed in my office.  The patient was reexamined.  There have been no changes to this history and physical examination.  Marcheta Grammes, DPM

## 2019-09-09 ENCOUNTER — Ambulatory Visit: Payer: Medicare Other | Attending: Internal Medicine

## 2019-09-09 ENCOUNTER — Ambulatory Visit: Payer: Medicare Other

## 2019-09-09 DIAGNOSIS — Z23 Encounter for immunization: Secondary | ICD-10-CM | POA: Insufficient documentation

## 2019-09-09 LAB — SURGICAL PATHOLOGY

## 2019-09-09 NOTE — Progress Notes (Signed)
   Covid-19 Vaccination Clinic  Name:  Amanda Terrell    MRN: [id number] DOB: 16-Jan-1956  09/09/2019  Amanda Terrell was observed post Covid-19 immunization for 42mins  without incidence. She was provided with Vaccine Information Sheet and instruction to access the V-Safe system.   Amanda Terrell was instructed to call 911 with any severe reactions post vaccine: Marland Kitchen Difficulty breathing  . Swelling of your face and throat  . A fast heartbeat  . A bad rash all over your body  . Dizziness and weakness    Immunizations Administered    Name Date Dose VIS Date Route   Pfizer COVID-19 Vaccine 09/09/2019 10:59 AM 0.3 mL 07/09/2019 Intramuscular   Manufacturer: [employer], [employer]   Lot: [id number]   [place]: [id number]

## 2019-09-13 LAB — AEROBIC/ANAEROBIC CULTURE W GRAM STAIN (SURGICAL/DEEP WOUND): Culture: NO GROWTH

## 2019-09-21 ENCOUNTER — Other Ambulatory Visit: Payer: Self-pay

## 2019-09-21 ENCOUNTER — Ambulatory Visit (HOSPITAL_COMMUNITY): Payer: Medicare Other

## 2019-09-21 ENCOUNTER — Ambulatory Visit (HOSPITAL_COMMUNITY)
Admission: RE | Admit: 2019-09-21 | Discharge: 2019-09-21 | Disposition: A | Discharge status: Home / Self Care | Payer: Medicare Other | Source: Ambulatory Visit | Attending: Nurse Practitioner | Admitting: Nurse Practitioner

## 2019-09-21 DIAGNOSIS — C50412 Malignant neoplasm of upper-outer quadrant of left female breast: Secondary | ICD-10-CM | POA: Diagnosis not present

## 2019-09-21 DIAGNOSIS — Z170 Estrogen receptor positive status [ER+]: Secondary | ICD-10-CM | POA: Diagnosis not present

## 2019-09-21 DIAGNOSIS — R922 Inconclusive mammogram: Secondary | ICD-10-CM | POA: Diagnosis not present

## 2019-09-24 ENCOUNTER — Inpatient Hospital Stay (HOSPITAL_COMMUNITY): Payer: Medicare Other

## 2019-09-24 ENCOUNTER — Inpatient Hospital Stay (HOSPITAL_COMMUNITY): Payer: Medicare Other | Attending: Hematology

## 2019-09-24 ENCOUNTER — Other Ambulatory Visit (HOSPITAL_COMMUNITY): Payer: Self-pay

## 2019-09-24 ENCOUNTER — Inpatient Hospital Stay (HOSPITAL_COMMUNITY): Payer: Medicare Other | Admitting: Nurse Practitioner

## 2019-09-24 ENCOUNTER — Other Ambulatory Visit: Payer: Self-pay

## 2019-09-24 ENCOUNTER — Encounter (HOSPITAL_COMMUNITY): Payer: Self-pay | Admitting: Nurse Practitioner

## 2019-09-24 VITALS — BP 103/61 | HR 81 | Temp 97.5°F | Resp 18 | Wt 154.9 lb

## 2019-09-24 DIAGNOSIS — Z923 Personal history of irradiation: Secondary | ICD-10-CM | POA: Diagnosis not present

## 2019-09-24 DIAGNOSIS — Z170 Estrogen receptor positive status [ER+]: Secondary | ICD-10-CM | POA: Diagnosis not present

## 2019-09-24 DIAGNOSIS — C50412 Malignant neoplasm of upper-outer quadrant of left female breast: Secondary | ICD-10-CM

## 2019-09-24 DIAGNOSIS — M810 Age-related osteoporosis without current pathological fracture: Secondary | ICD-10-CM

## 2019-09-24 DIAGNOSIS — Z79899 Other long term (current) drug therapy: Secondary | ICD-10-CM | POA: Insufficient documentation

## 2019-09-24 DIAGNOSIS — Z8719 Personal history of other diseases of the digestive system: Secondary | ICD-10-CM | POA: Insufficient documentation

## 2019-09-24 DIAGNOSIS — M1990 Unspecified osteoarthritis, unspecified site: Secondary | ICD-10-CM | POA: Insufficient documentation

## 2019-09-24 DIAGNOSIS — Z780 Asymptomatic menopausal state: Secondary | ICD-10-CM

## 2019-09-24 LAB — VITAMIN D 25 HYDROXY (VIT D DEFICIENCY, FRACTURES): Vit D, 25-Hydroxy: 40.67 ng/mL (ref 30–100)

## 2019-09-24 LAB — CBC WITH DIFFERENTIAL/PLATELET
Abs Immature Granulocytes: 0.01 10*3/uL (ref 0.00–0.07)
Basophils Absolute: 0 10*3/uL (ref 0.0–0.1)
Basophils Relative: 1 %
Eosinophils Absolute: 0.1 10*3/uL (ref 0.0–0.5)
Eosinophils Relative: 2 %
HCT: 46.1 % — ABNORMAL HIGH (ref 36.0–46.0)
Hemoglobin: 14.1 g/dL (ref 12.0–15.0)
Immature Granulocytes: 0 %
Lymphocytes Relative: 21 %
Lymphs Abs: 1.2 10*3/uL (ref 0.7–4.0)
MCH: 28.2 pg (ref 26.0–34.0)
MCHC: 30.6 g/dL (ref 30.0–36.0)
MCV: 92.2 fL (ref 80.0–100.0)
Monocytes Absolute: 0.4 10*3/uL (ref 0.1–1.0)
Monocytes Relative: 7 %
Neutro Abs: 3.8 10*3/uL (ref 1.7–7.7)
Neutrophils Relative %: 69 %
Platelets: 302 10*3/uL (ref 150–400)
RBC: 5 MIL/uL (ref 3.87–5.11)
RDW: 13.2 % (ref 11.5–15.5)
WBC: 5.6 10*3/uL (ref 4.0–10.5)
nRBC: 0 % (ref 0.0–0.2)

## 2019-09-24 LAB — COMPREHENSIVE METABOLIC PANEL
ALT: 19 U/L (ref 0–44)
AST: 19 U/L (ref 15–41)
Albumin: 4.2 g/dL (ref 3.5–5.0)
Alkaline Phosphatase: 56 U/L (ref 38–126)
Anion gap: 8 (ref 5–15)
BUN: 17 mg/dL (ref 8–23)
CO2: 32 mmol/L (ref 22–32)
Calcium: 9.5 mg/dL (ref 8.9–10.3)
Chloride: 99 mmol/L (ref 98–111)
Creatinine, Ser: 0.52 mg/dL (ref 0.44–1.00)
GFR calc Af Amer: >60 mL/min (ref >60)
GFR calc non Af Amer: >60 mL/min (ref >60)
Glucose, Bld: 81 mg/dL (ref 70–99)
Potassium: 4.3 mmol/L (ref 3.5–5.1)
Sodium: 139 mmol/L (ref 135–145)
Total Bilirubin: 0.7 mg/dL (ref 0.3–1.2)
Total Protein: 7.2 g/dL (ref 6.5–8.1)

## 2019-09-24 LAB — LACTATE DEHYDROGENASE: LDH: 116 U/L (ref 98–192)

## 2019-09-24 LAB — VITAMIN B12: Vitamin B-12: 372 pg/mL (ref 180–914)

## 2019-09-24 MED ORDER — DENOSUMAB 60 MG/ML ~~LOC~~ SOSY
60.0000 mg | PREFILLED_SYRINGE | Freq: Once | SUBCUTANEOUS | Status: AC
  Administered 2019-09-24: 60 mg via SUBCUTANEOUS

## 2019-09-24 MED ORDER — ERGOCALCIFEROL 1.25 MG (50000 UT) PO CAPS: 50000.0000 [IU] | ORAL_CAPSULE | ORAL | 4 refills | Status: DC

## 2019-09-24 MED ORDER — DENOSUMAB 60 MG/ML ~~LOC~~ SOSY
PREFILLED_SYRINGE | SUBCUTANEOUS | Status: AC
  Filled 2019-09-24: qty 1

## 2019-09-24 NOTE — Progress Notes (Signed)
Amanda Terrell presents today for injection per MD orders. Prolia administered SQ in left Upper Arm. Administration without incident. Patient tolerated well.

## 2019-09-24 NOTE — Progress Notes (Signed)
[place], [place] [address]   CLINIC:  Medical Oncology/Hematology  PCP:  [name], [place] [id number] [phone]   REASON FOR VISIT: Follow-up for breast cancer  CURRENT THERAPY: Observation  BRIEF ONCOLOGIC HISTORY:  Oncology History  Breast cancer of upper-outer quadrant of left female breast ([place])  07/05/2014 Breast US   Palpable left breast mass; U/S revealed irregular mass in UOQ of left breast with associated pleomorphic calcs. Mass and calcs span 1.5 cm.    07/07/2014 Initial Biopsy   Left breast needle core biopsy, 2 o'clock position revealed intermediate grade DCIS with calcs. ER+ (100%), PR+ (59%).    07/07/2014 Initial Diagnosis   Breast cancer of upper-outer quadrant of left female breast   07/12/2014 Breast MRI   UOQ of left breast with 2.0 x 1.4 x 1.3 cm biopsy-proven ductal carcinoma. No evidence of malignancy elsewhere in either breast. No adenopathy.    08/05/2014 Definitive Surgery   Left lumpectomy (Dr. Dalbert Batman) revealed intermediate grade DCIS, spanning 2.2 cm. Negative margins. 1 intramammary LN benign.    09/05/2014 - 10/04/2014 Radiation Therapy   Left breast: total radiation dose 42.72 Gy over 21 fractions.  Left breast boost: total radiation dose 10 Gy over 5 fractions.    09/22/2014 - 10/21/2014 Anti-estrogen oral therapy   Tamoxifen started by Dr. Whitney Muse.  Planned duration of treatment: 5 years.    10/16/2014 Adverse Reaction   Did not feel well. Worsening pain/joint pain   10/28/2014 Survivorship   Survivorship Care Plan given and reviewed with patient in-person.       CANCER STAGING: Cancer Staging Breast cancer of upper-outer quadrant of left female breast [facility]) Staging form: Breast, AJCC 7th Edition - Clinical stage from 07/13/2014: Stage 0 (Tis (DCIS), N0, M0) - Unsigned - Pathologic stage from 08/08/2014: Stage Unknown (Tis (DCIS), NX, cM0) - Signed by Seward Grater,  MD on 08/15/2014    INTERVAL HISTORY:  Ms. Saragoza 64 y.o. female returns for routine follow-up for breast cancer.  Patient reports she has been doing well since her last visit.  She has no complaints at this time. Denies any nausea, vomiting, or diarrhea. Denies any new pains. Had not noticed any recent bleeding such as epistaxis, hematuria or hematochezia. Denies recent chest pain on exertion, shortness of breath on minimal exertion, pre-syncopal episodes, or palpitations. Denies any numbness or tingling in hands or feet. Denies any recent fevers, infections, or recent hospitalizations. Patient reports appetite at 100% and energy level at 75%.  She is eating well maintain her weight at this time.    REVIEW OF SYSTEMS:  Review of Systems  All other systems reviewed and are negative.    PAST MEDICAL/SURGICAL HISTORY:  Past Medical History:  Diagnosis Date  . 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 Arthritis   . Axillary abscess    left  . Barrett's esophagus   . Blood transfusion without reported diagnosis    1989  . Breast cancer of upper-outer quadrant of left female breast ([place]) 07/11/2014  . Cataract    both eyes in 2016  . Cellulitis and abscess of unspecified site   . Diverticulosis   . Esophageal stricture   . GERD (gastroesophageal reflux disease)   . Heart murmur   . Hepatitis C 1990   interferon treatment  . Hiatal hernia   . Hyperlipidemia   . IBS (irritable bowel syndrome)   . Internal hemorrhoids   .  Mitral valve prolapse   . Neuropathy   . Osteoporosis   . Other specified disease of hair and hair follicles   . Personal history of radiation therapy 2016  . PONV (postoperative nausea and vomiting)   . Postmenopausal 02/24/2018  . 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 Reflex sympathetic dystrophy   . Rosacea   . Scoliosis    adhesive arachnoditis-right side   Past Surgical History:  Procedure Laterality Date  . [place], 2006     due to scoliosis= total 5 back surgeries  . BREAST FIBROADENOMA SURGERY     bilateral  . BREAST LUMPECTOMY Left   . BREAST LUMPECTOMY WITH RADIOACTIVE SEED LOCALIZATION Left 08/05/2014   Procedure: LEFT PARTICAL MASTECTOMY WITH RADIOACTIVE SEED LOCALIZATION;  Surgeon: Fanny Skates, MD;  Location: [facility];  Service: General;  Laterality: Left;  . [place], 1980, 1984  . COLONOSCOPY  2009  . HYSTEROSCOPY    . INCISE AND DRAIN ABCESS     left axillary abscess  . OSTECTOMY Right 09/08/2019   Procedure: OSTECTOMY OF DISTAL FIBULA RIGHT ANKLE AND DEBRIDEMENT OF ULCERATION RIGHT ANKLE;  Surgeon: Caprice Beaver, DPM;  Location: [facility];  Service: Podiatry;  Laterality: Right;  . SPINAL FUSION    . spinal surgeries    . TONSILLECTOMY    . UPPER GASTROINTESTINAL ENDOSCOPY       SOCIAL HISTORY:  Social History   Socioeconomic History  . Marital status: Married    Spouse name: Not on file  . Number of children: Not on file  . Years of education: Not on file  . Highest education level: Not on file  Occupational History  . Not on file  Tobacco Use  . Smoking status: Never Smoker  . Smokeless tobacco: Never Used  Substance and Sexual Activity  . Alcohol use: Yes    Alcohol/week: 0.0 standard drinks    Comment: 1 glass wine a month  . Drug use: No  . Sexual activity: Not Currently    Comment: 1st intercourse- 16, partners- 1, married- 35 yrs   Other Topics Concern  . Not on file  Social History Narrative  . Not on file   Social Determinants of Health   Financial Resource Strain:   . Difficulty of Paying Living Expenses: Not on file  Food Insecurity:   . Worried About Charity fundraiser in the Last Year: Not on file  . Ran Out of Food in the Last Year: Not on file  Transportation Needs:   . Lack of Transportation (Medical): Not on file  . Lack of Transportation (Non-Medical): Not on file  Physical Activity:   . Days of Exercise per  Week: Not on file  . Minutes of Exercise per Session: Not on file  Stress:   . Feeling of Stress : Not on file  Social Connections:   . Frequency of Communication with Friends and Family: Not on file  . Frequency of Social Gatherings with Friends and Family: Not on file  . Attends Religious Services: Not on file  . Active Member of Clubs or Organizations: Not on file  . Attends Archivist Meetings: Not on file  . Marital Status: Not on file  Intimate Partner Violence:   . Fear of Current or Ex-Partner: Not on file  . Emotionally Abused: Not on file  . Physically Abused: Not on file  . Sexually Abused: Not on file  FAMILY HISTORY:  Family History  Problem Relation Age of Onset  . Heart disease Mother   . Brain cancer Father        Glioblastoma  . Melanoma Paternal Grandmother   . Breast cancer Maternal Aunt        in her 30s  . Lung cancer Maternal Aunt   . Colon cancer Neg Hx   . Stomach cancer Neg Hx   . Rectal cancer Neg Hx   . Esophageal cancer Neg Hx     CURRENT MEDICATIONS:  Outpatient Encounter Medications as of 09/24/2019  Medication Sig  . Ascorbic Acid (VITA-C PO) Take 1 tablet by mouth daily.   . Calcium Carb-Cholecalciferol (CALCIUM 1000 + D) 1000-800 MG-UNIT TABS Take 1 tablet by mouth daily.   Marland Kitchen denosumab (PROLIA) 60 MG/ML SOSY injection Inject 60 mg into the skin every 6 (six) months.  . ergocalciferol (VITAMIN D2) 1.25 MG (50000 UT) capsule Take 50,000 Units by mouth once a week.  . gabapentin (NEURONTIN) 300 MG capsule Take 300 mg by mouth 4 (four) times daily as needed (pain).   Marland Kitchen ibuprofen (ADVIL,MOTRIN) 200 MG tablet Take 400 mg by mouth every 6 (six) hours as needed for moderate pain.   . Multiple Vitamin (MULTIVITAMIN) tablet Take 1 tablet by mouth daily.   Marland Kitchen omeprazole (PRILOSEC) 20 MG capsule Take 1 capsule (20 mg total) by mouth daily as needed.  . [DISCONTINUED] IBU 800 MG tablet Take 800 mg by mouth every 8 (eight) hours.  Marland Kitchen  HYDROcodone-acetaminophen (NORCO/VICODIN) 5-325 MG tablet Take 1 tablet by mouth every 6 (six) hours as needed.  . valACYclovir (VALTREX) 1000 MG tablet    No facility-administered encounter medications on file as of 09/24/2019.    ALLERGIES:  Allergies  Allergen Reactions  . Acetaminophen Other (See Comments)    Liver sensitivity secondary to h/o hep. c     PHYSICAL EXAM:  ECOG Performance status: 1  Vitals:   09/24/19 1055  BP: 103/61  Pulse: 81  Resp: 18  Temp: (!) 97.5 F (36.4 C)  SpO2: 97%   Filed Weights   09/24/19 1055  Weight: 154 lb 14.4 oz (70.3 kg)    Physical Exam Constitutional:      Appearance: Normal appearance. She is normal weight.  Musculoskeletal:        General: Normal range of motion.  Skin:    General: Skin is warm.  Neurological:     Mental Status: She is alert and oriented to person, place, and time. Mental status is at baseline.  Psychiatric:        Mood and Affect: Mood normal.        Behavior: Behavior normal.        Thought Content: Thought content normal.        Judgment: Judgment normal.      LABORATORY DATA:  I have reviewed the labs as listed.  CBC    Component Value Date/Time   WBC 5.6 09/24/2019 0920   RBC 5.00 09/24/2019 0920   HGB 14.1 09/24/2019 0920   HGB 14.7 07/13/2014 1220   HCT 46.1 (H) 09/24/2019 0920   HCT 45.7 07/13/2014 1220   PLT 302 09/24/2019 0920   PLT 244 07/13/2014 1220   MCV 92.2 09/24/2019 0920   MCV 87.9 07/13/2014 1220   MCH 28.2 09/24/2019 0920   MCHC 30.6 09/24/2019 0920   RDW 13.2 09/24/2019 0920   RDW 13.5 07/13/2014 1220   LYMPHSABS 1.2 09/24/2019 0920   LYMPHSABS  1.3 07/13/2014 1220   MONOABS 0.4 09/24/2019 0920   MONOABS 0.5 07/13/2014 1220   EOSABS 0.1 09/24/2019 0920   EOSABS 0.1 07/13/2014 1220   BASOSABS 0.0 09/24/2019 0920   BASOSABS 0.1 07/13/2014 1220   CMP Latest Ref Rng & Units 09/24/2019 09/06/2019 03/24/2019  Glucose 70 - 99 mg/dL 81 108(H) 101(H)  BUN 8 - 23 mg/dL 17  17 21   Creatinine 0.44 - 1.00 mg/dL 0.52 0.61 0.64  Sodium 135 - 145 mmol/L 139 141 137  Potassium 3.5 - 5.1 mmol/L 4.3 4.0 4.3  Chloride 98 - 111 mmol/L 99 99 99  CO2 22 - 32 mmol/L 32 32 29  Calcium 8.9 - 10.3 mg/dL 9.5 9.8 9.7  Total Protein 6.5 - 8.1 g/dL 7.2 - 7.5  Total Bilirubin 0.3 - 1.2 mg/dL 0.7 - 0.6  Alkaline Phos 38 - 126 U/L 56 - 55  AST 15 - 41 U/L 19 - 21  ALT 0 - 44 U/L 19 - 15   DIAGNOSTIC IMAGING:  I have independently reviewed the mammogram scans and discussed with the patient.     I personally performed a face-to-face visit.  All questions were answered to patient's stated satisfaction. Encouraged patient to call with any new concerns or questions before his next visit to the cancer center and we can certain see him sooner, if needed.     ASSESSMENT & PLAN:   Breast cancer of upper-outer quadrant of left female breast ([place]) 1.  DCIS of the left breast: -Biopsy on 07/07/2014, DCIS, ER/PR positive. -Left lumpectomy on 08/05/2014, 2.2 cm DCIS, intermediate grade, 0/1 lymph nodes positive, ER/PR positive. -Underwent radiation therapy from 09/15/2014-10/04/2014. -Took tamoxifen for only 1 week and had developed arthralgias and myalgias and could not continue it. -Physical assessment today did not reveal any suspicious masses or adenopathy. - Last mammogram on 09/21/2019 showed B RADS category 2. -Labs done on 09/24/2019 showed potassium 4.3, creatinine 0.52, LFTs WNL, LDH 116, WBC 5.6, hemoglobin 14.1, platelets 302 - She will follow-up in 6 months with repeat labs.  2.  Osteoporosis: -DEXA scan on 10/03/2017 showed T score of -3.9. -She started on Prolia on 03/10/2018.  Prior to that she was on Fosamax and could not tolerate it secondary to GI side effects. - Her calcium was within normal limits. -She takes calcium and vitamin D daily. -We will repeat a DEXA scan next visit.      Orders placed this encounter:  Orders Placed This Encounter  Procedures  . DG Bone  Density  . Lactate dehydrogenase  . CBC with Differential/Platelet  . Comprehensive metabolic panel  . Vitamin B12  . VITAMIN D 25 Hydroxy (Vit-D Deficiency, Fractures)      Francene Finders, FNP-C [place] [phone]

## 2019-09-24 NOTE — Assessment & Plan Note (Addendum)
1.  DCIS of the left breast: -Biopsy on 07/07/2014, DCIS, ER/PR positive. -Left lumpectomy on 08/05/2014, 2.2 cm DCIS, intermediate grade, 0/1 lymph nodes positive, ER/PR positive. -Underwent radiation therapy from 09/15/2014-10/04/2014. -Took tamoxifen for only 1 week and had developed arthralgias and myalgias and could not continue it. -Physical assessment today did not reveal any suspicious masses or adenopathy. - Last mammogram on 09/21/2019 showed B RADS category 2. -Labs done on 09/24/2019 showed potassium 4.3, creatinine 0.52, LFTs WNL, LDH 116, WBC 5.6, hemoglobin 14.1, platelets 302 - She will follow-up in 6 months with repeat labs.  2.  Osteoporosis: -DEXA scan on 10/03/2017 showed T score of -3.9. -She started on Prolia on 03/10/2018.  Prior to that she was on Fosamax and could not tolerate it secondary to GI side effects. - Her calcium was within normal limits. -She takes calcium and vitamin D daily. -We will repeat a DEXA scan next visit.

## 2019-09-24 NOTE — Patient Instructions (Signed)
[facility] at [facility] Discharge Instructions  Follow up in 6 months with labs    Thank you for choosing [facility] at [facility] to provide your oncology and hematology care.  To afford each patient quality time with our provider, please arrive at least 15 minutes before your scheduled appointment time.   If you have a lab appointment with the Cancer Center please come in thru the Main Entrance and check in at the main information desk.  You need to re-schedule your appointment should you arrive 10 or more minutes late.  We strive to give you quality time with our providers, and arriving late affects you and other patients whose appointments are after yours.  Also, if you no show three or more times for appointments you may be dismissed from the clinic at the providers discretion.     Again, thank you for choosing [facility].  Our hope is that these requests will decrease the amount of time that you wait before being seen by our physicians.       _____________________________________________________________  Should you have questions after your visit to [facility], please contact our office at [phone] between the hours of 8:00 a.m. and 4:30 p.m.  Voicemails left after 4:00 p.m. will not be returned until the following business day.  For prescription refill requests, have your pharmacy contact our office and allow 72 hours.    Due to Covid, you will need to wear a mask upon entering the hospital. If you do not have a mask, a mask will be given to you at the Main Entrance upon arrival. For doctor visits, patients may have 1 support person with them. For treatment visits, patients can not have anyone with them due to social distancing guidelines and our immunocompromised population.      

## 2019-10-07 ENCOUNTER — Other Ambulatory Visit: Payer: Self-pay

## 2019-10-07 ENCOUNTER — Ambulatory Visit (HOSPITAL_COMMUNITY)
Admission: RE | Admit: 2019-10-07 | Discharge: 2019-10-07 | Disposition: A | Discharge status: Home / Self Care | Payer: Medicare Other | Source: Ambulatory Visit | Attending: Nurse Practitioner | Admitting: Nurse Practitioner

## 2019-10-07 DIAGNOSIS — M810 Age-related osteoporosis without current pathological fracture: Secondary | ICD-10-CM | POA: Diagnosis not present

## 2019-10-07 DIAGNOSIS — Z780 Asymptomatic menopausal state: Secondary | ICD-10-CM | POA: Diagnosis not present

## 2019-12-03 DIAGNOSIS — Z4889 Encounter for other specified surgical aftercare: Secondary | ICD-10-CM | POA: Diagnosis not present

## 2019-12-21 DIAGNOSIS — Z0120 Encounter for dental examination and cleaning without abnormal findings: Secondary | ICD-10-CM | POA: Diagnosis not present

## 2020-03-07 DIAGNOSIS — Z4889 Encounter for other specified surgical aftercare: Secondary | ICD-10-CM | POA: Diagnosis not present

## 2020-03-23 ENCOUNTER — Inpatient Hospital Stay (HOSPITAL_COMMUNITY): Payer: Medicare Other | Admitting: Nurse Practitioner

## 2020-03-23 ENCOUNTER — Inpatient Hospital Stay (HOSPITAL_COMMUNITY): Payer: Medicare Other | Attending: Hematology

## 2020-03-23 ENCOUNTER — Inpatient Hospital Stay (HOSPITAL_COMMUNITY): Payer: Medicare Other

## 2020-03-23 ENCOUNTER — Other Ambulatory Visit (HOSPITAL_COMMUNITY): Payer: Self-pay | Admitting: Nurse Practitioner

## 2020-03-23 ENCOUNTER — Other Ambulatory Visit: Payer: Self-pay

## 2020-03-23 VITALS — BP 133/67 | HR 65 | Temp 97.3°F | Resp 18 | Wt 151.6 lb

## 2020-03-23 DIAGNOSIS — Z8249 Family history of ischemic heart disease and other diseases of the circulatory system: Secondary | ICD-10-CM | POA: Diagnosis not present

## 2020-03-23 DIAGNOSIS — C50412 Malignant neoplasm of upper-outer quadrant of left female breast: Secondary | ICD-10-CM

## 2020-03-23 DIAGNOSIS — M810 Age-related osteoporosis without current pathological fracture: Secondary | ICD-10-CM | POA: Insufficient documentation

## 2020-03-23 DIAGNOSIS — Z923 Personal history of irradiation: Secondary | ICD-10-CM | POA: Insufficient documentation

## 2020-03-23 DIAGNOSIS — K5900 Constipation, unspecified: Secondary | ICD-10-CM | POA: Diagnosis not present

## 2020-03-23 DIAGNOSIS — M1990 Unspecified osteoarthritis, unspecified site: Secondary | ICD-10-CM | POA: Diagnosis not present

## 2020-03-23 DIAGNOSIS — R200 Anesthesia of skin: Secondary | ICD-10-CM | POA: Insufficient documentation

## 2020-03-23 DIAGNOSIS — Z170 Estrogen receptor positive status [ER+]: Secondary | ICD-10-CM | POA: Diagnosis not present

## 2020-03-23 DIAGNOSIS — Z803 Family history of malignant neoplasm of breast: Secondary | ICD-10-CM | POA: Diagnosis not present

## 2020-03-23 DIAGNOSIS — Z1231 Encounter for screening mammogram for malignant neoplasm of breast: Secondary | ICD-10-CM

## 2020-03-23 DIAGNOSIS — G479 Sleep disorder, unspecified: Secondary | ICD-10-CM | POA: Insufficient documentation

## 2020-03-23 DIAGNOSIS — Z79899 Other long term (current) drug therapy: Secondary | ICD-10-CM | POA: Insufficient documentation

## 2020-03-23 DIAGNOSIS — R197 Diarrhea, unspecified: Secondary | ICD-10-CM | POA: Diagnosis not present

## 2020-03-23 DIAGNOSIS — Z8719 Personal history of other diseases of the digestive system: Secondary | ICD-10-CM | POA: Insufficient documentation

## 2020-03-23 DIAGNOSIS — Z780 Asymptomatic menopausal state: Secondary | ICD-10-CM

## 2020-03-23 DIAGNOSIS — Z86018 Personal history of other benign neoplasm: Secondary | ICD-10-CM | POA: Diagnosis not present

## 2020-03-23 DIAGNOSIS — Z808 Family history of malignant neoplasm of other organs or systems: Secondary | ICD-10-CM | POA: Insufficient documentation

## 2020-03-23 DIAGNOSIS — Z801 Family history of malignant neoplasm of trachea, bronchus and lung: Secondary | ICD-10-CM | POA: Insufficient documentation

## 2020-03-23 LAB — VITAMIN D 25 HYDROXY (VIT D DEFICIENCY, FRACTURES): Vit D, 25-Hydroxy: 54.28 ng/mL (ref 30–100)

## 2020-03-23 LAB — COMPREHENSIVE METABOLIC PANEL
ALT: 15 U/L (ref 0–44)
AST: 18 U/L (ref 15–41)
Albumin: 4.2 g/dL (ref 3.5–5.0)
Alkaline Phosphatase: 50 U/L (ref 38–126)
Anion gap: 9 (ref 5–15)
BUN: 18 mg/dL (ref 8–23)
CO2: 29 mmol/L (ref 22–32)
Calcium: 9.5 mg/dL (ref 8.9–10.3)
Chloride: 101 mmol/L (ref 98–111)
Creatinine, Ser: 0.53 mg/dL (ref 0.44–1.00)
GFR calc Af Amer: >60 mL/min (ref >60)
GFR calc non Af Amer: >60 mL/min (ref >60)
Glucose, Bld: 89 mg/dL (ref 70–99)
Potassium: 3.8 mmol/L (ref 3.5–5.1)
Sodium: 139 mmol/L (ref 135–145)
Total Bilirubin: 0.4 mg/dL (ref 0.3–1.2)
Total Protein: 6.9 g/dL (ref 6.5–8.1)

## 2020-03-23 LAB — CBC WITH DIFFERENTIAL/PLATELET
Abs Immature Granulocytes: 0.01 10*3/uL (ref 0.00–0.07)
Basophils Absolute: 0 10*3/uL (ref 0.0–0.1)
Basophils Relative: 1 %
Eosinophils Absolute: 0.1 10*3/uL (ref 0.0–0.5)
Eosinophils Relative: 2 %
HCT: 44.6 % (ref 36.0–46.0)
Hemoglobin: 13.7 g/dL (ref 12.0–15.0)
Immature Granulocytes: 0 %
Lymphocytes Relative: 25 %
Lymphs Abs: 1.4 10*3/uL (ref 0.7–4.0)
MCH: 28.4 pg (ref 26.0–34.0)
MCHC: 30.7 g/dL (ref 30.0–36.0)
MCV: 92.5 fL (ref 80.0–100.0)
Monocytes Absolute: 0.5 10*3/uL (ref 0.1–1.0)
Monocytes Relative: 8 %
Neutro Abs: 3.7 10*3/uL (ref 1.7–7.7)
Neutrophils Relative %: 64 %
Platelets: 221 10*3/uL (ref 150–400)
RBC: 4.82 MIL/uL (ref 3.87–5.11)
RDW: 13.4 % (ref 11.5–15.5)
WBC: 5.7 10*3/uL (ref 4.0–10.5)
nRBC: 0 % (ref 0.0–0.2)

## 2020-03-23 LAB — VITAMIN B12: Vitamin B-12: 371 pg/mL (ref 180–914)

## 2020-03-23 LAB — LACTATE DEHYDROGENASE: LDH: 119 U/L (ref 98–192)

## 2020-03-23 MED ORDER — DENOSUMAB 60 MG/ML ~~LOC~~ SOSY
60.0000 mg | PREFILLED_SYRINGE | Freq: Once | SUBCUTANEOUS | Status: AC
  Administered 2020-03-23: 60 mg via SUBCUTANEOUS

## 2020-03-23 NOTE — Assessment & Plan Note (Signed)
1.  DCIS of the left breast: -Biopsy on 07/07/2014, DCIS, ER/PR positive. -Left lumpectomy on 08/05/2014, 2.2 cm DCIS, intermediate grade, 0/1 lymph nodes positive, ER/PR positive. -Underwent radiation therapy from 09/15/2014-10/04/2014. -Took tamoxifen for only 1 week and had developed arthralgias and myalgias and could not continue it. -Physical assessment today did not reveal any suspicious masses or adenopathy. - Last mammogram on 09/21/2019 showed B RADS category 2. -Labs done on 03/23/2020 showed potassium 3.8, creatinine 0.53, LFTs WNL, LDH 119, WBC 5.7, hemoglobin 13.7, platelets 221 - She will follow-up in 6 months with repeat labs and mammogram.  We will start seeing her yearly after this  2.  Osteoporosis: -DEXA scan on 10/07/2019 showed T score of -3.8. -She started on Prolia on 03/10/2018.  Prior to that she was on Fosamax and could not tolerate it secondary to GI side effects. - Her calcium was within normal limits. -She takes calcium and vitamin D daily.

## 2020-03-23 NOTE — Progress Notes (Signed)
Amanda Terrell presents today for denosumab injection. Lab work reviewed prior to administration. VSS. Pt reports taking Ca and Vit D as instructed. Pt denies tooth/jaw pain and denies recent or future invasive dental work. Injection tolerated well, see MAR for details. Site clean and dry, band aid applied. Pt discharged in satisfactory condition with follow up instructions.

## 2020-03-23 NOTE — Patient Instructions (Signed)
[facility] at [facility]  Discharge Instructions:  Denosumab injection What is this medicine? DENOSUMAB (den oh sue mab) slows bone breakdown. Prolia is used to treat osteoporosis in women after menopause and in men, and in people who are taking corticosteroids for 6 months or more. Xgeva is used to treat a high calcium level due to cancer and to prevent bone fractures and other bone problems caused by multiple myeloma or cancer bone metastases. Xgeva is also used to treat giant cell tumor of the bone. This medicine may be used for other purposes; ask your health care provider or pharmacist if you have questions. COMMON BRAND NAME(S): Prolia, XGEVA What should I tell my health care provider before I take this medicine? They need to know if you have any of these conditions:  dental disease  having surgery or tooth extraction  infection  kidney disease  low levels of calcium or Vitamin D in the blood  malnutrition  on hemodialysis  skin conditions or sensitivity  thyroid or parathyroid disease  an unusual reaction to denosumab, other medicines, foods, dyes, or preservatives  pregnant or trying to get pregnant  breast-feeding How should I use this medicine? This medicine is for injection under the skin. It is given by a health care professional in a hospital or clinic setting. A special MedGuide will be given to you before each treatment. Be sure to read this information carefully each time. For Prolia, talk to your pediatrician regarding the use of this medicine in children. Special care may be needed. For Xgeva, talk to your pediatrician regarding the use of this medicine in children. While this drug may be prescribed for children as young as 13 years for selected conditions, precautions do apply. Overdosage: If you think you have taken too much of this medicine contact a poison control center or emergency room at once. NOTE: This medicine is only for  you. Do not share this medicine with others. What if I miss a dose? It is important not to miss your dose. Call your doctor or health care professional if you are unable to keep an appointment. What may interact with this medicine? Do not take this medicine with any of the following medications:  other medicines containing denosumab This medicine may also interact with the following medications:  medicines that lower your chance of fighting infection  steroid medicines like prednisone or cortisone This list may not describe all possible interactions. Give your health care provider a list of all the medicines, herbs, non-prescription drugs, or dietary supplements you use. Also tell them if you smoke, drink alcohol, or use illegal drugs. Some items may interact with your medicine. What should I watch for while using this medicine? Visit your doctor or health care professional for regular checks on your progress. Your doctor or health care professional may order blood tests and other tests to see how you are doing. Call your doctor or health care professional for advice if you get a fever, chills or sore throat, or other symptoms of a cold or flu. Do not treat yourself. This drug may decrease your body's ability to fight infection. Try to avoid being around people who are sick. You should make sure you get enough calcium and vitamin D while you are taking this medicine, unless your doctor tells you not to. Discuss the foods you eat and the vitamins you take with your health care professional. See your dentist regularly. Brush and floss your teeth as directed.   Before you have any dental work done, tell your dentist you are receiving this medicine. Do not become pregnant while taking this medicine or for 5 months after stopping it. Talk with your doctor or health care professional about your birth control options while taking this medicine. Women should inform their doctor if they wish to become  pregnant or think they might be pregnant. There is a potential for serious side effects to an unborn child. Talk to your health care professional or pharmacist for more information. What side effects may I notice from receiving this medicine? Side effects that you should report to your doctor or health care professional as soon as possible:  allergic reactions like skin rash, itching or hives, swelling of the face, lips, or tongue  bone pain  breathing problems  dizziness  jaw pain, especially after dental work  redness, blistering, peeling of the skin  signs and symptoms of infection like fever or chills; cough; sore throat; pain or trouble passing urine  signs of low calcium like fast heartbeat, muscle cramps or muscle pain; pain, tingling, numbness in the hands or feet; seizures  unusual bleeding or bruising  unusually weak or tired Side effects that usually do not require medical attention (report to your doctor or health care professional if they continue or are bothersome):  constipation  diarrhea  headache  joint pain  loss of appetite  muscle pain  runny nose  tiredness  upset stomach This list may not describe all possible side effects. Call your doctor for medical advice about side effects. You may report side effects to FDA at 1-800-FDA-1088. Where should I keep my medicine? This medicine is only given in a clinic, doctor's office, or other health care setting and will not be stored at home. NOTE: This sheet is a summary. It may not cover all possible information. If you have questions about this medicine, talk to your doctor, pharmacist, or health care provider.  2020 Elsevier/Gold Standard (2017-11-21 16:10:44)  _______________________________________________________________  Thank you for choosing [facility] at [facility] to provide your oncology and hematology care.  To afford each patient quality time with our providers,  please arrive at least 15 minutes before your scheduled appointment.  You need to re-schedule your appointment if you arrive 10 or more minutes late.  We strive to give you quality time with our providers, and arriving late affects you and other patients whose appointments are after yours.  Also, if you no show three or more times for appointments you may be dismissed from the clinic.  Again, thank you for choosing [facility]. Our hope is that these requests will allow you access to exceptional care and in a timely manner. _______________________________________________________________  If you have questions after your visit, please contact our office at [phone] between the hours of 8:30 a.m. and 5:00 p.m. Voicemails left after 4:30 p.m. will not be returned until the following business day. _______________________________________________________________  For prescription refill requests, have your pharmacy contact our office. _______________________________________________________________  Recommendations made by the consultant and any test results will be sent to your referring physician. _______________________________________________________________ 

## 2020-03-23 NOTE — Progress Notes (Signed)
[place], [place] [address]   CLINIC:  Medical Oncology/Hematology  PCP:  [name], [address] [place] [address] [phone]   REASON FOR VISIT: Follow-up for breast cancer   CURRENT THERAPY: Observation  BRIEF ONCOLOGIC HISTORY:  Oncology History  Breast cancer of upper-outer quadrant of left female breast ([place])  07/05/2014 Breast US   Palpable left breast mass; U/S revealed irregular mass in UOQ of left breast with associated pleomorphic calcs. Mass and calcs span 1.5 cm.    07/07/2014 Initial Biopsy   Left breast needle core biopsy, 2 o'clock position revealed intermediate grade DCIS with calcs. ER+ (100%), PR+ (59%).    07/07/2014 Initial Diagnosis   Breast cancer of upper-outer quadrant of left female breast   07/12/2014 Breast MRI   UOQ of left breast with 2.0 x 1.4 x 1.3 cm biopsy-proven ductal carcinoma. No evidence of malignancy elsewhere in either breast. No adenopathy.    08/05/2014 Definitive Surgery   Left lumpectomy (Dr. Dalbert Batman) revealed intermediate grade DCIS, spanning 2.2 cm. Negative margins. 1 intramammary LN benign.    09/05/2014 - 10/04/2014 Radiation Therapy   Left breast: total radiation dose 42.72 Gy over 21 fractions.  Left breast boost: total radiation dose 10 Gy over 5 fractions.    09/22/2014 - 10/21/2014 Anti-estrogen oral therapy   Tamoxifen started by Dr. Whitney Muse.  Planned duration of treatment: 5 years.    10/16/2014 Adverse Reaction   Did not feel well. Worsening pain/joint pain   10/28/2014 Survivorship   Survivorship Care Plan given and reviewed with patient in-person.      CANCER STAGING: Cancer Staging Breast cancer of upper-outer quadrant of left female breast [facility]) Staging form: Breast, AJCC 7th Edition - Clinical stage from 07/13/2014: Stage 0 (Tis (DCIS), N0, M0) - Unsigned - Pathologic stage from 08/08/2014: Stage Unknown (Tis (DCIS), NX, cM0) - Signed by Seward Grater,  MD on 08/15/2014    INTERVAL HISTORY:  Amanda Terrell 64 y.o. female returns for routine follow-up for breast cancer.  Patient reports she has been doing well since her last visit.  She denies any new lumps or bumps present.  She denies any bone pain. Denies any nausea, vomiting, or diarrhea. Denies any new pains. Had not noticed any recent bleeding such as epistaxis, hematuria or hematochezia. Denies recent chest pain on exertion, shortness of breath on minimal exertion, pre-syncopal episodes, or palpitations. Denies any numbness or tingling in hands or feet. Denies any recent fevers, infections, or recent hospitalizations. Patient reports appetite at 100% and energy level at 100%.  She is eating well maintain her weight     REVIEW OF SYSTEMS:  Review of Systems  Gastrointestinal: Positive for constipation and diarrhea.  Neurological: Positive for numbness.  Psychiatric/Behavioral: Positive for sleep disturbance.  All other systems reviewed and are negative.    PAST MEDICAL/SURGICAL HISTORY:  Past Medical History:  Diagnosis Date  . 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 Arthritis   . Axillary abscess    left  . Barrett's esophagus   . Blood transfusion without reported diagnosis    1989  . Breast cancer of upper-outer quadrant of left female breast ([place]) 07/11/2014  . Cataract    both eyes in 2016  . Cellulitis and abscess of unspecified site   . Diverticulosis   . Esophageal stricture   . GERD (gastroesophageal reflux disease)   . Heart murmur   . Hepatitis C 1990  interferon treatment  . Hiatal hernia   . Hyperlipidemia   . IBS (irritable bowel syndrome)   . Internal hemorrhoids   . Mitral valve prolapse   . Neuropathy   . Osteoporosis   . Other specified disease of hair and hair follicles   . Personal history of radiation therapy 2016  . PONV (postoperative nausea and vomiting)   . Postmenopausal 02/24/2018  . 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  Reflex sympathetic dystrophy   . Rosacea   . Scoliosis    adhesive arachnoditis-right side   Past Surgical History:  Procedure Laterality Date  . [place], 2006   due to scoliosis= total 5 back surgeries  . BREAST FIBROADENOMA SURGERY     bilateral  . BREAST LUMPECTOMY Left   . BREAST LUMPECTOMY WITH RADIOACTIVE SEED LOCALIZATION Left 08/05/2014   Procedure: LEFT PARTICAL MASTECTOMY WITH RADIOACTIVE SEED LOCALIZATION;  Surgeon: Fanny Skates, MD;  Location: [facility];  Service: General;  Laterality: Left;  . [place], 1980, 1984  . COLONOSCOPY  2009  . HYSTEROSCOPY    . INCISE AND DRAIN ABCESS     left axillary abscess  . OSTECTOMY Right 09/08/2019   Procedure: OSTECTOMY OF DISTAL FIBULA RIGHT ANKLE AND DEBRIDEMENT OF ULCERATION RIGHT ANKLE;  Surgeon: Caprice Beaver, DPM;  Location: [facility];  Service: Podiatry;  Laterality: Right;  . SPINAL FUSION    . spinal surgeries    . TONSILLECTOMY    . UPPER GASTROINTESTINAL ENDOSCOPY       SOCIAL HISTORY:  Social History   Socioeconomic History  . Marital status: Married    Spouse name: Not on file  . Number of children: Not on file  . Years of education: Not on file  . Highest education level: Not on file  Occupational History  . Not on file  Tobacco Use  . Smoking status: Never Smoker  . Smokeless tobacco: Never Used  Vaping Use  . Vaping Use: Never used  Substance and Sexual Activity  . Alcohol use: Yes    Alcohol/week: 0.0 standard drinks    Comment: 1 glass wine a month  . Drug use: No  . Sexual activity: Not Currently    Comment: 1st intercourse- 16, partners- 1, married- 66 yrs   Other Topics Concern  . Not on file  Social History Narrative  . Not on file   Social Determinants of Health   Financial Resource Strain:   . Difficulty of Paying Living Expenses: Not on file  Food Insecurity:   . Worried About Charity fundraiser in the Last Year: Not on file  .  Ran Out of Food in the Last Year: Not on file  Transportation Needs:   . Lack of Transportation (Medical): Not on file  . Lack of Transportation (Non-Medical): Not on file  Physical Activity:   . Days of Exercise per Week: Not on file  . Minutes of Exercise per Session: Not on file  Stress:   . Feeling of Stress : Not on file  Social Connections:   . Frequency of Communication with Friends and Family: Not on file  . Frequency of Social Gatherings with Friends and Family: Not on file  . Attends Religious Services: Not on file  . Active Member of Clubs or Organizations: Not on file  . Attends Archivist Meetings: Not on file  . Marital Status: Not on file  Intimate Partner Violence:   .  Fear of Current or Ex-Partner: Not on file  . Emotionally Abused: Not on file  . Physically Abused: Not on file  . Sexually Abused: Not on file    FAMILY HISTORY:  Family History  Problem Relation Age of Onset  . Heart disease Mother   . Brain cancer Father        Glioblastoma  . Melanoma Paternal Grandmother   . Breast cancer Maternal Aunt        in her 36s  . Lung cancer Maternal Aunt   . Colon cancer Neg Hx   . Stomach cancer Neg Hx   . Rectal cancer Neg Hx   . Esophageal cancer Neg Hx     CURRENT MEDICATIONS:  Outpatient Encounter Medications as of 03/23/2020  Medication Sig  . Ascorbic Acid (VITA-C PO) Take 1 tablet by mouth daily.   . Calcium Carb-Cholecalciferol (CALCIUM 1000 + D) 1000-800 MG-UNIT TABS Take 1 tablet by mouth daily.   Marland Kitchen denosumab (PROLIA) 60 MG/ML SOSY injection Inject 60 mg into the skin every 6 (six) months.  . ergocalciferol (VITAMIN D2) 1.25 MG (50000 UT) capsule Take 1 capsule (50,000 Units total) by mouth once a week.  . gabapentin (NEURONTIN) 300 MG capsule Take 300 mg by mouth 4 (four) times daily as needed (pain).   . IBU 800 MG tablet Take 800 mg by mouth every 8 (eight) hours.  . Multiple Vitamin (MULTIVITAMIN) tablet Take 1 tablet by mouth  daily.   . mupirocin ointment (BACTROBAN) 2 % SMARTSIG:1 Application Topical 2-3 Times Daily  . omeprazole (PRILOSEC) 20 MG capsule Take 1 capsule (20 mg total) by mouth daily as needed.  . [DISCONTINUED] HYDROcodone-acetaminophen (NORCO/VICODIN) 5-325 MG tablet Take 1 tablet by mouth every 6 (six) hours as needed.  . [DISCONTINUED] ibuprofen (ADVIL,MOTRIN) 200 MG tablet Take 400 mg by mouth every 6 (six) hours as needed for moderate pain.   . valACYclovir (VALTREX) 1000 MG tablet  (Patient not taking: Reported on 03/23/2020)   No facility-administered encounter medications on file as of 03/23/2020.    ALLERGIES:  Allergies  Allergen Reactions  . Acetaminophen Other (See Comments)    Liver sensitivity secondary to h/o hep. c     PHYSICAL EXAM:  ECOG Performance status: 1  Vitals:   03/23/20 1059  BP: 133/67  Pulse: 65  Resp: 18  Temp: (!) 97.3 F (36.3 C)  SpO2: 97%   Filed Weights   03/23/20 1059  Weight: 151 lb 9.6 oz (68.8 kg)   Physical Exam Constitutional:      Appearance: Normal appearance. She is normal weight.  Cardiovascular:     Rate and Rhythm: Normal rate and regular rhythm.     Heart sounds: Normal heart sounds.  Pulmonary:     Effort: Pulmonary effort is normal.     Breath sounds: Normal breath sounds.  Abdominal:     General: Bowel sounds are normal.     Palpations: Abdomen is soft.  Musculoskeletal:        General: Normal range of motion.  Skin:    General: Skin is warm.  Neurological:     Mental Status: She is alert and oriented to person, place, and time. Mental status is at baseline.  Psychiatric:        Mood and Affect: Mood normal.        Behavior: Behavior normal.        Thought Content: Thought content normal.        Judgment: Judgment normal.  LABORATORY DATA:  I have reviewed the labs as listed.  CBC    Component Value Date/Time   WBC 5.7 03/23/2020 1013   RBC 4.82 03/23/2020 1013   HGB 13.7 03/23/2020 1013   HGB 14.7  07/13/2014 1220   HCT 44.6 03/23/2020 1013   HCT 45.7 07/13/2014 1220   PLT 221 03/23/2020 1013   PLT 244 07/13/2014 1220   MCV 92.5 03/23/2020 1013   MCV 87.9 07/13/2014 1220   MCH 28.4 03/23/2020 1013   MCHC 30.7 03/23/2020 1013   RDW 13.4 03/23/2020 1013   RDW 13.5 07/13/2014 1220   LYMPHSABS 1.4 03/23/2020 1013   LYMPHSABS 1.3 07/13/2014 1220   MONOABS 0.5 03/23/2020 1013   MONOABS 0.5 07/13/2014 1220   EOSABS 0.1 03/23/2020 1013   EOSABS 0.1 07/13/2014 1220   BASOSABS 0.0 03/23/2020 1013   BASOSABS 0.1 07/13/2014 1220   CMP Latest Ref Rng & Units 03/23/2020 09/24/2019 09/06/2019  Glucose 70 - 99 mg/dL 89 81 108(H)  BUN 8 - 23 mg/dL 18 17 17   Creatinine 0.44 - 1.00 mg/dL 0.53 0.52 0.61  Sodium 135 - 145 mmol/L 139 139 141  Potassium 3.5 - 5.1 mmol/L 3.8 4.3 4.0  Chloride 98 - 111 mmol/L 101 99 99  CO2 22 - 32 mmol/L 29 32 32  Calcium 8.9 - 10.3 mg/dL 9.5 9.5 9.8  Total Protein 6.5 - 8.1 g/dL 6.9 7.2 -  Total Bilirubin 0.3 - 1.2 mg/dL 0.4 0.7 -  Alkaline Phos 38 - 126 U/L 50 56 -  AST 15 - 41 U/L 18 19 -  ALT 0 - 44 U/L 15 19 -    All questions were answered to patient's stated satisfaction. Encouraged patient to call with any new concerns or questions before his next visit to the cancer center and we can certain see him sooner, if needed.     ASSESSMENT & PLAN:  Breast cancer of upper-outer quadrant of left female breast ([place]) 1.  DCIS of the left breast: -Biopsy on 07/07/2014, DCIS, ER/PR positive. -Left lumpectomy on 08/05/2014, 2.2 cm DCIS, intermediate grade, 0/1 lymph nodes positive, ER/PR positive. -Underwent radiation therapy from 09/15/2014-10/04/2014. -Took tamoxifen for only 1 week and had developed arthralgias and myalgias and could not continue it. -Physical assessment today did not reveal any suspicious masses or adenopathy. - Last mammogram on 09/21/2019 showed B RADS category 2. -Labs done on 03/23/2020 showed potassium 3.8, creatinine 0.53, LFTs WNL, LDH 119,  WBC 5.7, hemoglobin 13.7, platelets 221 - She will follow-up in 6 months with repeat labs and mammogram.  We will start seeing her yearly after this  2.  Osteoporosis: -DEXA scan on 10/07/2019 showed T score of -3.8. -She started on Prolia on 03/10/2018.  Prior to that she was on Fosamax and could not tolerate it secondary to GI side effects. - Her calcium was within normal limits. -She takes calcium and vitamin D daily.      Orders placed this encounter:  Orders Placed This Encounter  Procedures  . MM DIAG BREAST TOMO BILATERAL  . Lactate dehydrogenase  . CBC with Differential/Platelet  . Comprehensive metabolic panel  . Vitamin B12  . VITAMIN D 25 Hydroxy (Vit-D Deficiency, Fractures)      Francene Finders, FNP-C [place] [phone]

## 2020-05-04 DIAGNOSIS — G894 Chronic pain syndrome: Secondary | ICD-10-CM | POA: Diagnosis not present

## 2020-05-04 DIAGNOSIS — G629 Polyneuropathy, unspecified: Secondary | ICD-10-CM | POA: Diagnosis not present

## 2020-05-04 DIAGNOSIS — Z79899 Other long term (current) drug therapy: Secondary | ICD-10-CM | POA: Diagnosis not present

## 2020-06-27 DIAGNOSIS — Z0120 Encounter for dental examination and cleaning without abnormal findings: Secondary | ICD-10-CM | POA: Diagnosis not present

## 2020-09-22 ENCOUNTER — Ambulatory Visit (HOSPITAL_COMMUNITY): Payer: Medicare Other

## 2020-09-22 ENCOUNTER — Other Ambulatory Visit (HOSPITAL_COMMUNITY): Payer: Medicare Other

## 2020-09-22 ENCOUNTER — Other Ambulatory Visit (HOSPITAL_COMMUNITY): Payer: Self-pay

## 2020-09-22 DIAGNOSIS — C50412 Malignant neoplasm of upper-outer quadrant of left female breast: Secondary | ICD-10-CM

## 2020-09-22 DIAGNOSIS — Z170 Estrogen receptor positive status [ER+]: Secondary | ICD-10-CM

## 2020-09-25 ENCOUNTER — Other Ambulatory Visit: Payer: Self-pay

## 2020-09-25 ENCOUNTER — Ambulatory Visit (HOSPITAL_COMMUNITY)
Admission: RE | Admit: 2020-09-25 | Discharge: 2020-09-25 | Disposition: A | Discharge status: Home / Self Care | Payer: Medicare Other | Source: Ambulatory Visit | Attending: Nurse Practitioner | Admitting: Nurse Practitioner

## 2020-09-25 ENCOUNTER — Inpatient Hospital Stay (HOSPITAL_COMMUNITY): Payer: Medicare Other | Attending: Hematology

## 2020-09-25 DIAGNOSIS — C50412 Malignant neoplasm of upper-outer quadrant of left female breast: Secondary | ICD-10-CM | POA: Insufficient documentation

## 2020-09-25 DIAGNOSIS — Z1231 Encounter for screening mammogram for malignant neoplasm of breast: Secondary | ICD-10-CM | POA: Insufficient documentation

## 2020-09-25 DIAGNOSIS — Z170 Estrogen receptor positive status [ER+]: Secondary | ICD-10-CM | POA: Diagnosis not present

## 2020-09-25 LAB — VITAMIN D 25 HYDROXY (VIT D DEFICIENCY, FRACTURES): Vit D, 25-Hydroxy: 112.59 ng/mL — ABNORMAL HIGH (ref 30–100)

## 2020-09-25 LAB — COMPREHENSIVE METABOLIC PANEL
ALT: 14 U/L (ref 0–44)
AST: 19 U/L (ref 15–41)
Albumin: 4.3 g/dL (ref 3.5–5.0)
Alkaline Phosphatase: 54 U/L (ref 38–126)
Anion gap: 12 (ref 5–15)
BUN: 20 mg/dL (ref 8–23)
CO2: 27 mmol/L (ref 22–32)
Calcium: 9.6 mg/dL (ref 8.9–10.3)
Chloride: 98 mmol/L (ref 98–111)
Creatinine, Ser: 0.6 mg/dL (ref 0.44–1.00)
GFR, Estimated: >60 mL/min (ref >60)
Glucose, Bld: 87 mg/dL (ref 70–99)
Potassium: 4.2 mmol/L (ref 3.5–5.1)
Sodium: 137 mmol/L (ref 135–145)
Total Bilirubin: 0.7 mg/dL (ref 0.3–1.2)
Total Protein: 7 g/dL (ref 6.5–8.1)

## 2020-09-25 LAB — CBC WITH DIFFERENTIAL/PLATELET
Abs Immature Granulocytes: 0.02 10*3/uL (ref 0.00–0.07)
Basophils Absolute: 0 10*3/uL (ref 0.0–0.1)
Basophils Relative: 1 %
Eosinophils Absolute: 0.1 10*3/uL (ref 0.0–0.5)
Eosinophils Relative: 1 %
HCT: 45.6 % (ref 36.0–46.0)
Hemoglobin: 14.4 g/dL (ref 12.0–15.0)
Immature Granulocytes: 0 %
Lymphocytes Relative: 20 %
Lymphs Abs: 1.4 10*3/uL (ref 0.7–4.0)
MCH: 29 pg (ref 26.0–34.0)
MCHC: 31.6 g/dL (ref 30.0–36.0)
MCV: 91.8 fL (ref 80.0–100.0)
Monocytes Absolute: 0.5 10*3/uL (ref 0.1–1.0)
Monocytes Relative: 7 %
Neutro Abs: 4.9 10*3/uL (ref 1.7–7.7)
Neutrophils Relative %: 71 %
Platelets: 239 10*3/uL (ref 150–400)
RBC: 4.97 MIL/uL (ref 3.87–5.11)
RDW: 13.2 % (ref 11.5–15.5)
WBC: 7 10*3/uL (ref 4.0–10.5)
nRBC: 0 % (ref 0.0–0.2)

## 2020-09-25 LAB — VITAMIN B12: Vitamin B-12: 363 pg/mL (ref 180–914)

## 2020-09-25 LAB — LACTATE DEHYDROGENASE: LDH: 124 U/L (ref 98–192)

## 2020-09-26 ENCOUNTER — Encounter (HOSPITAL_COMMUNITY): Payer: Self-pay | Admitting: Oncology

## 2020-09-26 ENCOUNTER — Inpatient Hospital Stay (HOSPITAL_COMMUNITY): Payer: Medicare Other

## 2020-09-26 ENCOUNTER — Other Ambulatory Visit: Payer: Self-pay

## 2020-09-26 ENCOUNTER — Inpatient Hospital Stay (HOSPITAL_COMMUNITY): Payer: Medicare Other | Attending: Hematology | Admitting: Oncology

## 2020-09-26 VITALS — BP 118/63 | HR 68 | Temp 97.0°F | Resp 18 | Wt 151.5 lb

## 2020-09-26 DIAGNOSIS — M810 Age-related osteoporosis without current pathological fracture: Secondary | ICD-10-CM

## 2020-09-26 DIAGNOSIS — Z803 Family history of malignant neoplasm of breast: Secondary | ICD-10-CM | POA: Diagnosis not present

## 2020-09-26 DIAGNOSIS — Z86018 Personal history of other benign neoplasm: Secondary | ICD-10-CM | POA: Insufficient documentation

## 2020-09-26 DIAGNOSIS — Z8249 Family history of ischemic heart disease and other diseases of the circulatory system: Secondary | ICD-10-CM | POA: Diagnosis not present

## 2020-09-26 DIAGNOSIS — Z923 Personal history of irradiation: Secondary | ICD-10-CM | POA: Diagnosis not present

## 2020-09-26 DIAGNOSIS — Z808 Family history of malignant neoplasm of other organs or systems: Secondary | ICD-10-CM | POA: Insufficient documentation

## 2020-09-26 DIAGNOSIS — Z8719 Personal history of other diseases of the digestive system: Secondary | ICD-10-CM | POA: Diagnosis not present

## 2020-09-26 DIAGNOSIS — M1990 Unspecified osteoarthritis, unspecified site: Secondary | ICD-10-CM | POA: Diagnosis not present

## 2020-09-26 DIAGNOSIS — Z801 Family history of malignant neoplasm of trachea, bronchus and lung: Secondary | ICD-10-CM | POA: Insufficient documentation

## 2020-09-26 DIAGNOSIS — Z170 Estrogen receptor positive status [ER+]: Secondary | ICD-10-CM | POA: Diagnosis not present

## 2020-09-26 DIAGNOSIS — Z79899 Other long term (current) drug therapy: Secondary | ICD-10-CM | POA: Insufficient documentation

## 2020-09-26 DIAGNOSIS — G479 Sleep disorder, unspecified: Secondary | ICD-10-CM | POA: Insufficient documentation

## 2020-09-26 DIAGNOSIS — R200 Anesthesia of skin: Secondary | ICD-10-CM | POA: Diagnosis not present

## 2020-09-26 DIAGNOSIS — K5900 Constipation, unspecified: Secondary | ICD-10-CM | POA: Diagnosis not present

## 2020-09-26 DIAGNOSIS — G9050 Complex regional pain syndrome I, unspecified: Secondary | ICD-10-CM | POA: Insufficient documentation

## 2020-09-26 DIAGNOSIS — Z780 Asymptomatic menopausal state: Secondary | ICD-10-CM

## 2020-09-26 DIAGNOSIS — C50412 Malignant neoplasm of upper-outer quadrant of left female breast: Secondary | ICD-10-CM | POA: Diagnosis not present

## 2020-09-26 DIAGNOSIS — R197 Diarrhea, unspecified: Secondary | ICD-10-CM | POA: Insufficient documentation

## 2020-09-26 DIAGNOSIS — M6281 Muscle weakness (generalized): Secondary | ICD-10-CM | POA: Insufficient documentation

## 2020-09-26 MED ORDER — MISC. DEVICES MISC: 0 refills | Status: DC

## 2020-09-26 MED ORDER — DENOSUMAB 60 MG/ML ~~LOC~~ SOSY
60.0000 mg | PREFILLED_SYRINGE | Freq: Once | SUBCUTANEOUS | Status: AC
  Administered 2020-09-26: 60 mg via SUBCUTANEOUS
  Filled 2020-09-26: qty 1

## 2020-09-26 NOTE — Progress Notes (Signed)
.  Amanda Terrell presents today for injection per the provider's orders. Prolia administration without incident; injection site WNL; see MAR for injection details. No recent or future dental appointments. Pt is taking Calcium and Vit.D as directed. No tooth/ jaw pain noted.  Patient tolerated procedure well and without incident.  No questions or complaints noted at this time. Pt discharged via cane accompanied by husband. Pt discharged in satisfactory condition.

## 2020-09-26 NOTE — Progress Notes (Signed)
Amanda Terrell, [place] [id number]   CLINIC:  Medical Oncology/Hematology  PCP:  Amanda Terrell, [place] [address] [phone]   REASON FOR VISIT: Follow-up for breast cancer   CURRENT THERAPY: Observation  BRIEF ONCOLOGIC HISTORY:  Oncology History  Breast cancer of upper-outer quadrant of left female breast ([place])  07/05/2014 Breast US   Palpable left breast mass; U/S revealed irregular mass in UOQ of left breast with associated pleomorphic calcs. Mass and calcs span 1.5 cm.    07/07/2014 Initial Biopsy   Left breast needle core biopsy, 2 o'clock position revealed intermediate grade DCIS with calcs. ER+ (100%), PR+ (59%).    07/07/2014 Initial Diagnosis   Breast cancer of upper-outer quadrant of left female breast   07/12/2014 Breast MRI   UOQ of left breast with 2.0 x 1.4 x 1.3 cm biopsy-proven ductal carcinoma. No evidence of malignancy elsewhere in either breast. No adenopathy.    08/05/2014 Definitive Surgery   Left lumpectomy (Dr. Dalbert Terrell) revealed intermediate grade DCIS, spanning 2.2 cm. Negative margins. 1 intramammary LN benign.    09/05/2014 - 10/04/2014 Radiation Therapy   Left breast: total radiation dose 42.72 Gy over 21 fractions.  Left breast boost: total radiation dose 10 Gy over 5 fractions.    09/22/2014 - 10/21/2014 Anti-estrogen oral therapy   Tamoxifen started by Dr. Whitney Terrell.  Planned duration of treatment: 5 years.    10/16/2014 Adverse Reaction   Did not feel well. Worsening pain/joint pain   10/28/2014 Survivorship   Survivorship Care Plan given and reviewed with patient in-person.      CANCER STAGING: Cancer Staging Breast cancer of upper-outer quadrant of left female breast [facility]) Staging form: Breast, AJCC 7th Edition - Clinical stage from 07/13/2014: Stage 0 (Tis (DCIS), N0, M0) - Unsigned - Pathologic stage from 08/08/2014: Stage Unknown (Tis (DCIS), NX, cM0) - Signed by Amanda Grater,  MD on 08/15/2014    INTERVAL HISTORY:  [name] 65 y.o. female returns for routine follow-up for breast cancer.  She was last seen in clinic on 03/23/2020.  In the interim, she has done well.  She had her annual mammogram completed yesterday.  She denies any new lumps or bumps.  She denies any new bone pain.  She recently had braces placed so her teeth are sore.  No new dental concerns. Taking calcium and vitamin D.  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  Currently on Prolia for osteoporosis.   REVIEW OF SYSTEMS:  Review of Systems  Gastrointestinal: Positive for constipation and diarrhea.  Neurological: Positive for numbness.  Psychiatric/Behavioral: Positive for sleep disturbance.  All other systems reviewed and are negative.    PAST MEDICAL/SURGICAL HISTORY:  Past Medical History:  Diagnosis Date  . 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 Arthritis   . Axillary abscess    left  . Barrett's esophagus   . Blood transfusion without reported diagnosis    1989  . Breast cancer of upper-outer quadrant of left female breast ([place]) 07/11/2014  . Cataract    both eyes in 2016  . Cellulitis and abscess of unspecified site   . Diverticulosis   . Esophageal stricture   . GERD (gastroesophageal reflux disease)   . Heart murmur   . Hepatitis C 1990   interferon treatment  . Hiatal hernia   . Hyperlipidemia   . IBS (irritable bowel syndrome)   .  Internal hemorrhoids   . Mitral valve prolapse   . Neuropathy   . Osteoporosis   . Other specified disease of hair and hair follicles   . Personal history of radiation therapy 2016  . PONV (postoperative nausea and vomiting)   . Postmenopausal 02/24/2018  . 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 Reflex sympathetic dystrophy   . Rosacea   . Scoliosis    adhesive arachnoditis-right side   Past Surgical History:  Procedure Laterality Date  . [place], 2006   due to scoliosis= total 5 back surgeries  . BREAST FIBROADENOMA SURGERY     bilateral  . BREAST LUMPECTOMY Left   . BREAST LUMPECTOMY WITH RADIOACTIVE SEED LOCALIZATION Left 08/05/2014   Procedure: LEFT PARTICAL MASTECTOMY WITH RADIOACTIVE SEED LOCALIZATION;  Surgeon: Amanda Skates, MD;  Location: [facility];  Service: General;  Laterality: Left;  . [place], 1980, 1984  . COLONOSCOPY  2009  . HYSTEROSCOPY    . INCISE AND DRAIN ABCESS     left axillary abscess  . OSTECTOMY Right 09/08/2019   Procedure: OSTECTOMY OF DISTAL FIBULA RIGHT ANKLE AND DEBRIDEMENT OF ULCERATION RIGHT ANKLE;  Surgeon: Caprice Beaver, DPM;  Location: [facility];  Service: Podiatry;  Laterality: Right;  . SPINAL FUSION    . spinal surgeries    . TONSILLECTOMY    . UPPER GASTROINTESTINAL ENDOSCOPY       SOCIAL HISTORY:  Social History   Socioeconomic History  . Marital status: Married    Spouse name: Not on file  . Number of children: Not on file  . Years of education: Not on file  . Highest education level: Not on file  Occupational History  . Not on file  Tobacco Use  . Smoking status: Never Smoker  . Smokeless tobacco: Never Used  Vaping Use  . Vaping Use: Never used  Substance and Sexual Activity  . Alcohol use: Yes    Alcohol/week: 0.0 standard drinks    Comment: 1 glass wine a month  . Drug use: No  . Sexual activity: Not Currently    Comment: 1st intercourse- 16, partners- 1, married- 69 yrs   Other Topics Concern  . Not on file  Social History Narrative  . Not on file   Social Determinants of Health   Financial Resource Strain: Not on file  Food Insecurity: Not on file  Transportation Needs: Not on file  Physical Activity: Not on file  Stress: Not on file  Social Connections: Not on file  Intimate Partner Violence: Not on file    FAMILY HISTORY:  Family History  Problem Relation Age of Onset  . Heart disease Mother   .  Brain cancer Father        Glioblastoma  . Melanoma Paternal Grandmother   . Breast cancer Maternal Aunt        in her 35s  . Lung cancer Maternal Aunt   . Colon cancer Neg Hx   . Stomach cancer Neg Hx   . Rectal cancer Neg Hx   . Esophageal cancer Neg Hx     CURRENT MEDICATIONS:  Outpatient Encounter Medications as of 09/26/2020  Medication Sig  . Misc. Devices MISC Please give Shingrix vaccine and Tdap vaccination.  . Ascorbic Acid (VITA-C PO) Take 1 tablet by mouth daily.   . Calcium Carb-Cholecalciferol (CALCIUM 1000 + D) 1000-800 MG-UNIT TABS Take 1 tablet by mouth daily.   Marland Kitchen denosumab (PROLIA)  60 MG/ML SOSY injection Inject 60 mg into the skin every 6 (six) months.  . ergocalciferol (VITAMIN D2) 1.25 MG (50000 UT) capsule Take 1 capsule (50,000 Units total) by mouth once a week.  . gabapentin (NEURONTIN) 300 MG capsule Take 300 mg by mouth 4 (four) times daily as needed (pain).   . IBU 800 MG tablet Take 800 mg by mouth every 8 (eight) hours.  . Multiple Vitamin (MULTIVITAMIN) tablet Take 1 tablet by mouth daily.   . mupirocin ointment (BACTROBAN) 2 % SMARTSIG:1 Application Topical 2-3 Times Daily (Patient not taking: Reported on 09/26/2020)  . omeprazole (PRILOSEC) 20 MG capsule Take 1 capsule (20 mg total) by mouth daily as needed.  . valACYclovir (VALTREX) 1000 MG tablet  (Patient not taking: No sig reported)  . [EXPIRED] denosumab (PROLIA) injection 60 mg    No facility-administered encounter medications on file as of 09/26/2020.    ALLERGIES:  Allergies  Allergen Reactions  . Acetaminophen Other (See Comments)    Liver sensitivity secondary to h/o hep. c     PHYSICAL EXAM:  ECOG Performance status: 1  Vitals:   09/26/20 1117  BP: 118/63  Pulse: 68  Resp: 18  Temp: (!) 97 F (36.1 C)  SpO2: 98%   Filed Weights   09/26/20 1117  Weight: 151 lb 7.3 oz (68.7 kg)   Physical Exam Constitutional:      Appearance: Normal appearance. She is normal weight.   Cardiovascular:     Rate and Rhythm: Normal rate and regular rhythm.     Heart sounds: Normal heart sounds.  Pulmonary:     Effort: Pulmonary effort is normal.     Breath sounds: Normal breath sounds.  Abdominal:     General: Bowel sounds are normal.     Palpations: Abdomen is soft.  Musculoskeletal:        General: Normal range of motion.  Skin:    General: Skin is warm.  Neurological:     Mental Status: She is alert and oriented to person, place, and time. Mental status is at baseline.  Psychiatric:        Mood and Affect: Mood normal.        Behavior: Behavior normal.        Thought Content: Thought content normal.        Judgment: Judgment normal.      LABORATORY DATA:  I have reviewed the labs as listed.  CBC    Component Value Date/Time   WBC 7.0 09/25/2020 1310   RBC 4.97 09/25/2020 1310   HGB 14.4 09/25/2020 1310   HGB 14.7 07/13/2014 1220   HCT 45.6 09/25/2020 1310   HCT 45.7 07/13/2014 1220   PLT 239 09/25/2020 1310   PLT 244 07/13/2014 1220   MCV 91.8 09/25/2020 1310   MCV 87.9 07/13/2014 1220   MCH 29.0 09/25/2020 1310   MCHC 31.6 09/25/2020 1310   RDW 13.2 09/25/2020 1310   RDW 13.5 07/13/2014 1220   LYMPHSABS 1.4 09/25/2020 1310   LYMPHSABS 1.3 07/13/2014 1220   MONOABS 0.5 09/25/2020 1310   MONOABS 0.5 07/13/2014 1220   EOSABS 0.1 09/25/2020 1310   EOSABS 0.1 07/13/2014 1220   BASOSABS 0.0 09/25/2020 1310   BASOSABS 0.1 07/13/2014 1220   CMP Latest Ref Rng & Units 09/25/2020 03/23/2020 09/24/2019  Glucose 70 - 99 mg/dL 87 89 81  BUN 8 - 23 mg/dL 20 18 17   Creatinine 0.44 - 1.00 mg/dL 0.60 0.53 0.52  Sodium 135 -  145 mmol/L 137 139 139  Potassium 3.5 - 5.1 mmol/L 4.2 3.8 4.3  Chloride 98 - 111 mmol/L 98 101 99  CO2 22 - 32 mmol/L 27 29 32  Calcium 8.9 - 10.3 mg/dL 9.6 9.5 9.5  Total Protein 6.5 - 8.1 g/dL 7.0 6.9 7.2  Total Bilirubin 0.3 - 1.2 mg/dL 0.7 0.4 0.7  Alkaline Phos 38 - 126 U/L 54 50 56  AST 15 - 41 U/L 19 18 19   ALT 0 - 44 U/L  14 15 19     All questions were answered to patient's stated satisfaction. Encouraged patient to call with any new concerns or questions before his next visit to the cancer center and we can certain see him sooner, if needed.     ASSESSMENT & PLAN:  1.  DCIS of the left breast: -Biopsy on 07/07/2014, DCIS, ER/PR positive. -Left lumpectomy on 08/05/2014, 2.2 cm DCIS, intermediate grade, 0/1 lymph nodes positive, ER/PR positive. -Underwent radiation therapy from 09/15/2014-10/04/2014. -Took tamoxifen for only 1 week and had developed arthralgias and myalgias and could not continue it. - Last mammogram on 09/25/2020 is still pending during dictation. -Labs done on  09/25/20 show all labs WNL.   2.  Osteoporosis: -DEXA scan on 10/07/2019 showed T score of -3.8 (-3.9). Very stable.  -She started on Prolia on 03/10/2018.  Prior to that she was on Fosamax and could not tolerate it secondary to GI side effects. - Her calcium was within normal limits. -She takes calcium and vitamin D daily.  Disposition: -Patient to get Prolia today. -We will call patient with mammogram results. -Anticipate repeat bone density scan in February/March 2023 given stability from 2019 and 2021. -Return to clinic in 6 months with repeat labs (see, he, vitamin D, LDH and B12), MD assessment and Prolia injection  Greater than 50% was spent in counseling and coordination of care with this patient including but not limited to discussion of the relevant topics above (See A&P) including, but not limited to diagnosis and management of acute and chronic medical conditions.    No problem-specific Assessment & Plan notes found for this encounter.  Orders placed this encounter:  Orders Placed This Encounter  Procedures  . Varicella-zoster vaccine IM (Shingrix)  . Tdap vaccine greater than or equal to 7yo IM    Faythe Casa, NP 09/26/2020 1:47 PM  [place] [phone]

## 2020-09-27 ENCOUNTER — Other Ambulatory Visit (HOSPITAL_COMMUNITY): Payer: Self-pay | Admitting: Oncology

## 2020-09-27 DIAGNOSIS — R928 Other abnormal and inconclusive findings on diagnostic imaging of breast: Secondary | ICD-10-CM

## 2020-09-28 ENCOUNTER — Other Ambulatory Visit (HOSPITAL_COMMUNITY): Payer: Self-pay

## 2020-09-28 MED ORDER — MISC. DEVICES MISC: 0 refills | Status: DC

## 2020-09-29 ENCOUNTER — Ambulatory Visit (HOSPITAL_COMMUNITY)
Admission: RE | Admit: 2020-09-29 | Discharge: 2020-09-29 | Disposition: A | Discharge status: Home / Self Care | Payer: Medicare Other | Source: Ambulatory Visit | Attending: Oncology | Admitting: Oncology

## 2020-09-29 ENCOUNTER — Other Ambulatory Visit: Payer: Self-pay

## 2020-09-29 DIAGNOSIS — R928 Other abnormal and inconclusive findings on diagnostic imaging of breast: Secondary | ICD-10-CM | POA: Diagnosis not present

## 2020-12-28 ENCOUNTER — Other Ambulatory Visit (HOSPITAL_COMMUNITY): Payer: Self-pay

## 2020-12-28 DIAGNOSIS — R030 Elevated blood-pressure reading, without diagnosis of hypertension: Secondary | ICD-10-CM | POA: Diagnosis not present

## 2020-12-28 DIAGNOSIS — Z79899 Other long term (current) drug therapy: Secondary | ICD-10-CM | POA: Diagnosis not present

## 2020-12-28 DIAGNOSIS — G629 Polyneuropathy, unspecified: Secondary | ICD-10-CM | POA: Diagnosis not present

## 2020-12-28 DIAGNOSIS — M810 Age-related osteoporosis without current pathological fracture: Secondary | ICD-10-CM

## 2020-12-28 DIAGNOSIS — Z170 Estrogen receptor positive status [ER+]: Secondary | ICD-10-CM

## 2020-12-28 DIAGNOSIS — G894 Chronic pain syndrome: Secondary | ICD-10-CM | POA: Diagnosis not present

## 2021-01-05 ENCOUNTER — Other Ambulatory Visit: Payer: Self-pay

## 2021-01-05 ENCOUNTER — Ambulatory Visit (HOSPITAL_COMMUNITY)
Admission: RE | Admit: 2021-01-05 | Discharge: 2021-01-05 | Disposition: A | Discharge status: Home / Self Care | Payer: Medicare Other | Source: Ambulatory Visit | Attending: Hematology | Admitting: Hematology

## 2021-01-05 DIAGNOSIS — M810 Age-related osteoporosis without current pathological fracture: Secondary | ICD-10-CM | POA: Insufficient documentation

## 2021-01-05 DIAGNOSIS — Z170 Estrogen receptor positive status [ER+]: Secondary | ICD-10-CM

## 2021-01-05 DIAGNOSIS — C50412 Malignant neoplasm of upper-outer quadrant of left female breast: Secondary | ICD-10-CM | POA: Diagnosis not present

## 2021-03-26 ENCOUNTER — Other Ambulatory Visit (HOSPITAL_COMMUNITY): Payer: Self-pay | Admitting: Surgery

## 2021-03-26 DIAGNOSIS — Z170 Estrogen receptor positive status [ER+]: Secondary | ICD-10-CM

## 2021-03-26 DIAGNOSIS — M810 Age-related osteoporosis without current pathological fracture: Secondary | ICD-10-CM

## 2021-03-28 ENCOUNTER — Inpatient Hospital Stay (HOSPITAL_COMMUNITY): Payer: Medicare Other | Attending: Hematology

## 2021-03-28 ENCOUNTER — Inpatient Hospital Stay (HOSPITAL_COMMUNITY): Payer: Medicare Other

## 2021-03-28 ENCOUNTER — Other Ambulatory Visit: Payer: Self-pay

## 2021-03-28 DIAGNOSIS — Z170 Estrogen receptor positive status [ER+]: Secondary | ICD-10-CM | POA: Insufficient documentation

## 2021-03-28 DIAGNOSIS — M810 Age-related osteoporosis without current pathological fracture: Secondary | ICD-10-CM

## 2021-03-28 DIAGNOSIS — M8580 Other specified disorders of bone density and structure, unspecified site: Secondary | ICD-10-CM | POA: Diagnosis not present

## 2021-03-28 DIAGNOSIS — C50412 Malignant neoplasm of upper-outer quadrant of left female breast: Secondary | ICD-10-CM | POA: Insufficient documentation

## 2021-03-28 LAB — COMPREHENSIVE METABOLIC PANEL
ALT: 19 U/L (ref 0–44)
AST: 21 U/L (ref 15–41)
Albumin: 4.3 g/dL (ref 3.5–5.0)
Alkaline Phosphatase: 68 U/L (ref 38–126)
Anion gap: 9 (ref 5–15)
BUN: 12 mg/dL (ref 8–23)
CO2: 29 mmol/L (ref 22–32)
Calcium: 9.5 mg/dL (ref 8.9–10.3)
Chloride: 97 mmol/L — ABNORMAL LOW (ref 98–111)
Creatinine, Ser: 0.58 mg/dL (ref 0.44–1.00)
GFR, Estimated: >60 mL/min (ref >60)
Glucose, Bld: 103 mg/dL — ABNORMAL HIGH (ref 70–99)
Potassium: 4.3 mmol/L (ref 3.5–5.1)
Sodium: 135 mmol/L (ref 135–145)
Total Bilirubin: 0.8 mg/dL (ref 0.3–1.2)
Total Protein: 7.7 g/dL (ref 6.5–8.1)

## 2021-03-28 LAB — CBC WITH DIFFERENTIAL/PLATELET
Abs Immature Granulocytes: 0.01 10*3/uL (ref 0.00–0.07)
Basophils Absolute: 0 10*3/uL (ref 0.0–0.1)
Basophils Relative: 1 %
Eosinophils Absolute: 0.2 10*3/uL (ref 0.0–0.5)
Eosinophils Relative: 3 %
HCT: 47.1 % — ABNORMAL HIGH (ref 36.0–46.0)
Hemoglobin: 15.1 g/dL — ABNORMAL HIGH (ref 12.0–15.0)
Immature Granulocytes: 0 %
Lymphocytes Relative: 6 %
Lymphs Abs: 0.4 10*3/uL — ABNORMAL LOW (ref 0.7–4.0)
MCH: 29.4 pg (ref 26.0–34.0)
MCHC: 32.1 g/dL (ref 30.0–36.0)
MCV: 91.6 fL (ref 80.0–100.0)
Monocytes Absolute: 0.3 10*3/uL (ref 0.1–1.0)
Monocytes Relative: 5 %
Neutro Abs: 5.7 10*3/uL (ref 1.7–7.7)
Neutrophils Relative %: 85 %
Platelets: 242 10*3/uL (ref 150–400)
RBC: 5.14 MIL/uL — ABNORMAL HIGH (ref 3.87–5.11)
RDW: 13.6 % (ref 11.5–15.5)
WBC: 6.7 10*3/uL (ref 4.0–10.5)
nRBC: 0 % (ref 0.0–0.2)

## 2021-03-28 LAB — LACTATE DEHYDROGENASE: LDH: 130 U/L (ref 98–192)

## 2021-03-28 LAB — VITAMIN D 25 HYDROXY (VIT D DEFICIENCY, FRACTURES): Vit D, 25-Hydroxy: 40.03 ng/mL (ref 30–100)

## 2021-03-28 LAB — VITAMIN B12: Vitamin B-12: 408 pg/mL (ref 180–914)

## 2021-04-03 ENCOUNTER — Telehealth: Payer: Self-pay | Admitting: Gastroenterology

## 2021-04-03 NOTE — Progress Notes (Signed)
[facility] [address] [place] [address]   Patient Care Team: [name], MD as PCP - General (Internal Medicine) Glenna Fellows, MD as Referring Physician (Neurosurgery) Fanny Skates, MD as Consulting Physician (General Surgery) Truitt Merle, MD as Consulting Physician (Hematology) Thea Silversmith, MD as Consulting Physician (Radiation Oncology) Holley Bouche, NP (Inactive) as Nurse Practitioner (Nurse Practitioner) Patrici Ranks, MD (Inactive) as Consulting Physician (Hematology and Oncology)  SUMMARY OF ONCOLOGIC HISTORY: Oncology History  Breast cancer of upper-outer quadrant of left female breast ([place])  07/05/2014 Breast US   Palpable left breast mass; U/S revealed irregular mass in UOQ of left breast with associated pleomorphic calcs. Mass and calcs span 1.5 cm.    07/07/2014 Initial Biopsy   Left breast needle core biopsy, 2 o'clock position revealed intermediate grade DCIS with calcs. ER+ (100%), PR+ (59%).    07/07/2014 Initial Diagnosis   Breast cancer of upper-outer quadrant of left female breast   07/12/2014 Breast MRI   UOQ of left breast with 2.0 x 1.4 x 1.3 cm biopsy-proven ductal carcinoma. No evidence of malignancy elsewhere in either breast. No adenopathy.    08/05/2014 Definitive Surgery   Left lumpectomy (Dr. Dalbert Batman) revealed intermediate grade DCIS, spanning 2.2 cm. Negative margins. 1 intramammary LN benign.    09/05/2014 - 10/04/2014 Radiation Therapy   Left breast: total radiation dose 42.72 Gy over 21 fractions.  Left breast boost: total radiation dose 10 Gy over 5 fractions.    09/22/2014 - 10/21/2014 Anti-estrogen oral therapy   Tamoxifen started by Dr. Whitney Muse.  Planned duration of treatment: 5 years.    10/16/2014 Adverse Reaction   Did not feel well. Worsening pain/joint pain   10/28/2014 Survivorship   Survivorship Care Plan given and reviewed with patient in-person.      CHIEF COMPLIANT: Follow-up for Breast cancer of  upper-outer quadrant of left female breast, ER+/PR+   INTERVAL HISTORY: Amanda Terrell is a 65 y.o. female here today for follow up of her left breast cancer. Her last visit was on 09/11/2018.   Today she reports feeling good. She is taking calcium and vitamin D3. She reports diarrhea over the past few months which she attributed to diverticulosis. She denies any joint or jaw pain.  REVIEW OF SYSTEMS:   Review of Systems  Constitutional:  Negative for fatigue (80%).  Gastrointestinal:  Positive for abdominal pain (8/10 stomach) and diarrhea (diverticulosis).  Musculoskeletal:  Positive for back pain (8/10). Negative for arthralgias.  Neurological:  Positive for numbness (neuropathy).  Psychiatric/Behavioral:  Positive for sleep disturbance.   All other systems reviewed and are negative.  I have reviewed the past medical history, past surgical history, social history and family history with the patient and they are unchanged from previous note.   ALLERGIES:   is allergic to acetaminophen.   MEDICATIONS:  Current Outpatient Medications  Medication Sig Dispense Refill   Ascorbic Acid (VITA-C PO) Take 1 tablet by mouth daily.      Calcium Carb-Cholecalciferol (CALCIUM 1000 + D) 1000-800 MG-UNIT TABS Take 1 tablet by mouth daily.      denosumab (PROLIA) 60 MG/ML SOSY injection Inject 60 mg into the skin every 6 (six) months.     ergocalciferol (VITAMIN D2) 1.25 MG (50000 UT) capsule Take 1 capsule (50,000 Units total) by mouth once a week. 16 capsule 4   gabapentin (NEURONTIN) 300 MG capsule Take 300 mg by mouth 4 (four) times daily as needed (pain).  IBU 800 MG tablet Take 800 mg by mouth every 8 (eight) hours.     Misc. Devices MISC Please give Shingrix vaccination approximately 3 months from the previous dose. 1 each 0   Multiple Vitamin (MULTIVITAMIN) tablet Take 1 tablet by mouth daily.      mupirocin ointment (BACTROBAN) 2 % SMARTSIG:1 Application Topical 2-3 Times Daily  (Patient not taking: Reported on 09/26/2020)     omeprazole (PRILOSEC) 20 MG capsule Take 1 capsule (20 mg total) by mouth daily as needed. 90 capsule 3   valACYclovir (VALTREX) 1000 MG tablet  (Patient not taking: No sig reported)     No current facility-administered medications for this visit.     PHYSICAL EXAMINATION: Performance status (ECOG): 1 - Symptomatic but completely ambulatory  There were no vitals filed for this visit. Wt Readings from Last 3 Encounters:  09/26/20 151 lb 7.3 oz (68.7 kg)  03/23/20 151 lb 9.6 oz (68.8 kg)  09/24/19 154 lb 14.4 oz (70.3 kg)   Physical Exam Vitals reviewed.  Constitutional:      Appearance: Normal appearance.  Cardiovascular:     Rate and Rhythm: Normal rate and regular rhythm.     Pulses: Normal pulses.     Heart sounds: Normal heart sounds.  Pulmonary:     Effort: Pulmonary effort is normal.     Breath sounds: Normal breath sounds.  Neurological:     General: No focal deficit present.     Mental Status: She is alert and oriented to person, place, and time.  Psychiatric:        Mood and Affect: Mood normal.        Behavior: Behavior normal.    Breast Exam Chaperone: Thana Ates     LABORATORY DATA:  I have reviewed the data as listed CMP Latest Ref Rng & Units 03/28/2021 09/25/2020 03/23/2020  Glucose 70 - 99 mg/dL 103(H) 87 89  BUN 8 - 23 mg/dL '12 20 18  '$ Creatinine 0.44 - 1.00 mg/dL 0.58 0.60 0.53  Sodium 135 - 145 mmol/L 135 137 139  Potassium 3.5 - 5.1 mmol/L 4.3 4.2 3.8  Chloride 98 - 111 mmol/L 97(L) 98 101  CO2 22 - 32 mmol/L '29 27 29  '$ Calcium 8.9 - 10.3 mg/dL 9.5 9.6 9.5  Total Protein 6.5 - 8.1 g/dL 7.7 7.0 6.9  Total Bilirubin 0.3 - 1.2 mg/dL 0.8 0.7 0.4  Alkaline Phos 38 - 126 U/L 68 54 50  AST 15 - 41 U/L '21 19 18  '$ ALT 0 - 44 U/L '19 14 15   '$ No results found for: [id number] Lab Results  Component Value Date   WBC 6.7 03/28/2021   HGB 15.1 (H) 03/28/2021   HCT 47.1 (H) 03/28/2021   MCV 91.6 03/28/2021   PLT  242 03/28/2021   NEUTROABS 5.7 03/28/2021    ASSESSMENT:  1.  DCIS of the left breast: -Biopsy on 07/07/2014, DCIS, ER/PR positive - Left lumpectomy on 08/05/2014, 2.2 cm DCIS, intermediate grade, 0/1 lymph node positive, PTis PN0.  ER/PR positive. - Underwent radiation therapy from 09/05/2014 through 10/04/2014. -Took tamoxifen for only 1 week and had developed arthralgias and myalgias and could not continue it.    2.  Osteoporosis: -DEXA scan on 10/03/2017 shows T score of -3.9. -She was started on Prolia on 03/10/2018.  PLAN:  1.  DCIS of the left breast: - Reviewed mammogram from 09/29/2020 which showed multiple cysts and right breast milk calcium.  BI-RADS Category 2 with benign calcifications.  Repeat mammogram in 1 year. - Reviewed her labs from 03/28/2021 which showed normal CBC and LFTs. - RTC 1 year for follow-up.   2.  Osteoporosis: - Reviewed bone density from 01/05/2021 which showed improvement in T score of -3.2. - Vitamin D level was normal at 40.  Continue vitamin D plus calcium daily. - Continue Prolia every 6 months.  She is tolerating it well.  Breast Cancer therapy associated bone loss: I have recommended calcium, Vitamin D and weight bearing exercises.  Orders placed this encounter:  No orders of the defined types were placed in this encounter.   The patient has a good understanding of the overall plan. She agrees with it. She will call with any problems that may develop before the next visit here.  Derek Jack, MD [place] [phone]   I, Thana Ates, am acting as a scribe for Dr. Derek Jack.  I, Derek Jack MD, have reviewed the above documentation for accuracy and completeness, and I agree with the above.

## 2021-04-03 NOTE — Telephone Encounter (Signed)
Spoke with patient, advised that she will need an appt for evaluation before we can advise. It has been almost 3 years since she has been seen. Patient has been scheduled for a follow up appt with Amanda Bogus, PA-C on Thursday, 04/12/21 at 1:30 PM. Patient is aware that she can also reach out to her PCP in the interim to see if they can see her. Advised patient that if she has any worsening symptoms prior to her appt she will need to go to Urgent care or ED for evaluation. Patient verbalized understanding and had no concerns at the end of the call.

## 2021-04-04 ENCOUNTER — Inpatient Hospital Stay (HOSPITAL_COMMUNITY): Payer: Medicare Other | Attending: Hematology

## 2021-04-04 ENCOUNTER — Other Ambulatory Visit: Payer: Self-pay

## 2021-04-04 ENCOUNTER — Inpatient Hospital Stay (HOSPITAL_COMMUNITY): Payer: Medicare Other | Admitting: Hematology

## 2021-04-04 VITALS — BP 118/73 | HR 80 | Temp 98.2°F | Resp 16 | Wt 147.5 lb

## 2021-04-04 DIAGNOSIS — R200 Anesthesia of skin: Secondary | ICD-10-CM | POA: Diagnosis not present

## 2021-04-04 DIAGNOSIS — R197 Diarrhea, unspecified: Secondary | ICD-10-CM | POA: Diagnosis not present

## 2021-04-04 DIAGNOSIS — R109 Unspecified abdominal pain: Secondary | ICD-10-CM | POA: Diagnosis not present

## 2021-04-04 DIAGNOSIS — N6001 Solitary cyst of right breast: Secondary | ICD-10-CM | POA: Insufficient documentation

## 2021-04-04 DIAGNOSIS — G479 Sleep disorder, unspecified: Secondary | ICD-10-CM | POA: Diagnosis not present

## 2021-04-04 DIAGNOSIS — C50412 Malignant neoplasm of upper-outer quadrant of left female breast: Secondary | ICD-10-CM

## 2021-04-04 DIAGNOSIS — Z79899 Other long term (current) drug therapy: Secondary | ICD-10-CM | POA: Insufficient documentation

## 2021-04-04 DIAGNOSIS — Z780 Asymptomatic menopausal state: Secondary | ICD-10-CM

## 2021-04-04 DIAGNOSIS — M810 Age-related osteoporosis without current pathological fracture: Secondary | ICD-10-CM | POA: Diagnosis not present

## 2021-04-04 DIAGNOSIS — Z170 Estrogen receptor positive status [ER+]: Secondary | ICD-10-CM | POA: Insufficient documentation

## 2021-04-04 DIAGNOSIS — M549 Dorsalgia, unspecified: Secondary | ICD-10-CM | POA: Diagnosis not present

## 2021-04-04 DIAGNOSIS — Z923 Personal history of irradiation: Secondary | ICD-10-CM | POA: Insufficient documentation

## 2021-04-04 MED ORDER — DENOSUMAB 60 MG/ML ~~LOC~~ SOSY
60.0000 mg | PREFILLED_SYRINGE | Freq: Once | SUBCUTANEOUS | Status: AC
  Administered 2021-04-04: 60 mg via SUBCUTANEOUS
  Filled 2021-04-04: qty 1

## 2021-04-04 NOTE — Patient Instructions (Addendum)
[place] at [facility] Discharge Instructions  You were seen today by Dr. Delton Coombes. He went over your recent results, and you received your injection. You will be scheduled for a mammogram after 09/29/2021. Dr. Delton Coombes will see you back in 1 year for labs and follow up.   Thank you for choosing [place] at [facility] to provide your oncology and hematology care.  To afford each patient quality time with our provider, please arrive at least 15 minutes before your scheduled appointment time.   If you have a lab appointment with the [facility] please come in thru the Main Entrance and check in at the main information desk  You need to re-schedule your appointment should you arrive 10 or more minutes late.  We strive to give you quality time with our providers, and arriving late affects you and other patients whose appointments are after yours.  Also, if you no show three or more times for appointments you may be dismissed from the clinic at the providers discretion.     Again, thank you for choosing [facility].  Our hope is that these requests will decrease the amount of time that you wait before being seen by our physicians.       _____________________________________________________________  Should you have questions after your visit to [facility], please contact our office at [phone] between the hours of 8:00 a.m. and 4:30 p.m.  Voicemails left after 4:00 p.m. will not be returned until the following business day.  For prescription refill requests, have your pharmacy contact our office and allow 72 hours.    Cancer Center Support Programs:   > Cancer Support Group  2nd Tuesday of the month 1pm-2pm, Journey Room

## 2021-04-04 NOTE — Progress Notes (Signed)
Prolia 60 mg given in right arm SQ without complications.  Site clean, dry and intact.  Patient discharged to home ambulatory accompanied by spouse in stable condition.

## 2021-04-05 ENCOUNTER — Encounter (HOSPITAL_COMMUNITY): Payer: Self-pay | Admitting: Hematology

## 2021-04-12 ENCOUNTER — Ambulatory Visit: Payer: Medicare Other | Admitting: Gastroenterology

## 2021-04-12 ENCOUNTER — Encounter: Payer: Self-pay | Admitting: Gastroenterology

## 2021-04-12 VITALS — BP 128/64 | HR 76 | Ht 65.0 in | Wt 146.2 lb

## 2021-04-12 DIAGNOSIS — K5900 Constipation, unspecified: Secondary | ICD-10-CM | POA: Diagnosis not present

## 2021-04-12 DIAGNOSIS — Z791 Long term (current) use of non-steroidal anti-inflammatories (NSAID): Secondary | ICD-10-CM | POA: Diagnosis not present

## 2021-04-12 DIAGNOSIS — R1013 Epigastric pain: Secondary | ICD-10-CM | POA: Diagnosis not present

## 2021-04-12 DIAGNOSIS — R1084 Generalized abdominal pain: Secondary | ICD-10-CM

## 2021-04-12 MED ORDER — OMEPRAZOLE 20 MG PO CPDR: 20.0000 mg | DELAYED_RELEASE_CAPSULE | Freq: Two times a day (BID) | ORAL | 3 refills | Status: DC

## 2021-04-12 NOTE — Patient Instructions (Signed)
If you are age 67 or older, your body mass index should be between 23-30. Your Body mass index is 24.33 kg/m. If this is out of the aforementioned range listed, please consider follow up with your Primary Care Provider. __________________________________________________________  The [place] [place] [facility] providers would like to encourage you to use [facility] to communicate with providers for non-urgent requests or questions.  Due to long hold times on the telephone, sending your provider a message by [facility] may be a faster and more efficient way to get a response.  Please allow 48 business hours for a response.  Please remember that this is for non-urgent requests.   You have been scheduled for an endoscopy. Please follow written instructions given to you at your visit today. If you use inhalers (even only as needed), please bring them with you on the day of your procedure.  You have been scheduled for a CT scan of the abdomen and pelvis at [facility] ([address]---this is in the same building as [employer]).   You are scheduled on 04/23/2021 at 11:30 am. You should arrive 15 minutes prior to your appointment time for registration. Please follow the written instructions below on the day of your exam:  WARNING: IF YOU ARE ALLERGIC TO IODINE/X-RAY DYE, PLEASE NOTIFY RADIOLOGY IMMEDIATELY AT [phone]! YOU WILL BE GIVEN A 13 HOUR PREMEDICATION PREP.  1) Do not eat anything after 7:30 am (4 hours prior to your test) 2) You have been given 2 bottles of oral contrast to drink. The solution may taste better if refrigerated, but do NOT add ice or any other liquid to this solution. Shake well before drinking.    Drink 1 bottle of contrast @ 9:30 am (2 hours prior to your exam)  Drink 1 bottle of contrast @ 10:30 am (1 hour prior to your exam)  You may take any medications as prescribed with a small amount of water, if necessary. If you take any of the following medications:  METFORMIN, GLUCOPHAGE, GLUCOVANCE, AVANDAMET, RIOMET, FORTAMET, [place] MET, JANUMET, GLUMETZA or METAGLIP, you MAY be asked to HOLD this medication 48 hours AFTER the exam.  The purpose of you drinking the oral contrast is to aid in the visualization of your intestinal tract. The contrast solution may cause some diarrhea. Depending on your individual set of symptoms, you may also receive an intravenous injection of x-ray contrast/dye. Plan on being at [facility] for 30 minutes or longer, depending on the type of exam you are having performed.  This test typically takes 30-45 minutes to complete.  If you have any questions regarding your exam or if you need to reschedule, you may call the CT department at [phone] between the hours of 8:00 am and 5:00 pm, Monday-Friday.  ____________________________________________________________  INCREASE Omeprazole 20 mg to 1 capsule twice daily.  Use Miralax 1 capful in 8 ounces of water or juice and Benefiber 2 tsp daily in water or juice at separate times of the day.   Follow up pending the results of your CT and Endoscopy or as needed.  Thank you for entrusting me with your care and choosing [facility].  Alonza Bogus, PA-C

## 2021-04-12 NOTE — Progress Notes (Signed)
04/12/2021 Amanda Terrell [id number] Nov 11, 1955   HISTORY OF PRESENT ILLNESS:  This is a 65 year old female who is a patient of Dr. Doyne Keel who follows here for GERD and chronic constipation.  She is here today after an almost 3 year hiatus.  She complains today of abdominal pain on the left side and in her epigastrium.  She has very hard stools but then only uses the Miralax as needed because then sometimes she will have an occasion very loose stool (about 10 episodes since April).  She reports a lot of gas.  She uses Ibuprofen regularly for quite some time.  Is on omeprazole 20 mg daily.  No sign of GI bleeding.  CBC and CMP last month were unremarkable.  Colonoscopy 02/2017 showed the following:  - Diverticulosis in the entire examined colon. - Internal hemorrhoids. - A single (solitary) ulcer in the distal rectum. Biopsied. This may be the cause for the patient's symptoms - perhaps a stercoral ulcer related to constipation. - Tortuous colon. - The examination was otherwise normal.  EGD 06/2015 showed the following:  Mild benign appearing Shatski ring, dilated up to 82m TTS ballon without appreciable wrent. The ring was then disrupted with biopsy forceps 3cm hiatal hernia Benign gastric diverticulum Benign appearing small gastric polyp, removed Mild antral gastritis, suspect related to NSAID use, biopsies taken to rule out H pylori Normal duodenum  1. Surgical [P], gastric antrum and gastric body - BENIGN ANTRAL GASTRIC MUCOSA WITH FINDINGS CONSISTENT WITH REACTIVE GASTROPATHY. - BENIGN OXYNTIC GASTRIC MUCOSA WITH MILD CHRONIC INACTIVE GASTRITIS. - WARTHIN-STARRY STAIN IS NEGATIVE FOR HELICOBACTER PYLORI. - NO INTESTINAL METAPLASIA, DYSPLASIA, OR MALIGNANCY. 2. Surgical [P], gastric polyp - FUNDIC GLAND POLYP. - ASSOCIATED MILD CHRONIC INFLAMMATION. - WARTHIN-STARRY STAIN IS NEGATIVE FOR HELICOBACTER PYLORI. - NO INTESTINAL METAPLASIA, DYSPLASIA, OR MALIGNANCY. 3.  Surgical [P], GE junction schatzki's ring biopsy - INFLAMED SQUAMOCOLUMNAR MUCOSA WITH FINDINGS CONSISTENT WITH ESOPHAGITIS. - NO INTESTINAL METAPLASIA, DYSPLASIA, OR MALIGNANCY. - SEE COMMENT.  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   Arthritis    Axillary abscess    left   Barrett's esophagus    Blood transfusion without reported diagnosis    1989   Breast cancer of upper-outer quadrant of left female breast [place] 07/11/2014   Cataract    both eyes in 2016   Cellulitis and abscess of unspecified site    Diverticulosis    Esophageal stricture    GERD (gastroesophageal reflux disease)    Heart murmur    Hepatitis C 1990   interferon treatment   Hiatal hernia    History of breast cancer    Hyperlipidemia    IBS (irritable bowel syndrome)    Internal hemorrhoids    Mitral valve prolapse    Neuropathy    Osteoporosis    Other specified disease of hair and hair follicles    Personal history of radiation therapy 2016   PONV (postoperative nausea and vomiting)    Postmenopausal 02/24/2018   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/05/14-10/04/14   Left Breast DCIS   Reflex sympathetic dystrophy    Rosacea    Scoliosis  adhesive arachnoditis-right side   Past Surgical History:  Procedure Laterality Date   [place], 2006   due to scoliosis= total 5 back surgeries   BREAST FIBROADENOMA SURGERY     bilateral   BREAST LUMPECTOMY Left    BREAST LUMPECTOMY WITH RADIOACTIVE SEED LOCALIZATION Left 08/05/2014   Procedure: LEFT PARTICAL MASTECTOMY WITH RADIOACTIVE SEED LOCALIZATION;  Surgeon: Fanny Skates, MD;  Location: [facility];  Service: General;  Laterality: Left;   [place]   COLONOSCOPY  2009   HYSTEROSCOPY     INCISE AND DRAIN ABCESS     left axillary abscess   OSTECTOMY Right 09/08/2019   Procedure: OSTECTOMY OF DISTAL FIBULA RIGHT ANKLE AND DEBRIDEMENT OF ULCERATION RIGHT ANKLE;  Surgeon: Caprice Beaver, DPM;  Location: [facility];  Service: Podiatry;  Laterality: Right;   SPINAL FUSION     spinal surgeries     TONSILLECTOMY     UPPER GASTROINTESTINAL ENDOSCOPY      reports that she has never smoked. She has never used smokeless tobacco. She reports current alcohol use. She reports that she does not use drugs. family history includes Brain cancer in her father; Breast cancer in her maternal aunt; Heart disease in her mother; Lung cancer in her maternal aunt; Melanoma in her paternal grandmother. Allergies  Allergen Reactions   Acetaminophen Other (See Comments)    Liver sensitivity secondary to h/o hep. c      Outpatient Encounter Medications as of 04/12/2021  Medication Sig   Ascorbic Acid (VITA-C PO) Take 1 tablet by mouth daily.    Calcium Carb-Cholecalciferol 1000-800 MG-UNIT TABS Take 1 tablet by mouth daily.    denosumab (PROLIA) 60 MG/ML SOSY injection Inject 60 mg into the skin every 6 (six) months.   gabapentin (NEURONTIN) 300 MG capsule Take 300 mg by mouth 4 (four) times daily as needed (pain).    IBU 800 MG tablet Take 800 mg by mouth every 8 (eight) hours.   Multiple Vitamin (MULTIVITAMIN) tablet Take 1 tablet by mouth daily.    mupirocin ointment (BACTROBAN) 2 %    omeprazole (PRILOSEC) 20 MG capsule Take 1 capsule (20 mg total) by mouth daily as needed.   valACYclovir (VALTREX) 1000 MG tablet    ergocalciferol (VITAMIN D2) 1.25 MG (50000 UT) capsule Take 1 capsule (50,000 Units total) by mouth once a week.   Misc. Devices MISC Please give Shingrix vaccination approximately 3 months from the previous dose.    No facility-administered encounter medications on file as of 04/12/2021.     REVIEW OF SYSTEMS  : All other systems reviewed and negative except where noted in the History of Present Illness.   PHYSICAL EXAM: BP 128/64   Pulse 76   Ht '5\' 5"'$  (1.651 m)   Wt 146 lb 3.2 oz (66.3 kg)   SpO2 98%   BMI 24.33 kg/m  General: Well developed white female in no acute distress Head: Normocephalic and atraumatic Eyes:  Sclerae anicteric, conjunctiva pink. Ears: Normal auditory acuity Lungs: Clear throughout to auscultation; no W/R/R. Heart: Regular rate and rhythm; no M/R/G. Abdomen: Soft, non-distended.  BS present. Mild epigastric and left sided TTP. Musculoskeletal: Symmetrical with no gross deformities  Skin: No lesions on visible extremities Extremities: No edema  Neurological: Alert oriented x 4, grossly non-focal Psychological:  Alert and cooperative. Normal mood and affect  ASSESSMENT AND PLAN: *Epigastric abdominal pain and long-standing NSAID use:  Will plan for EGD  with Dr. Havery Moros to rule out ulcer, etc.  I have asked her to increase her omeprazole to 20 mg BID for now as well.  New prescription sent to pharmacy.  The risks, benefits, and alternatives to EGD were discussed with the patient and she consents to proceed.  *Generalized to left sided abdominal pain:  Has diverticulosis but has never been treated for diverticulitis in the past that I can see.  Will check a CT scan of the abdomen and pelvis with contrast as she's not had any cross-sectional imaging of her abdomen dating back to 2011 that I can see. *Chronic constipation:  I have asked her to be sure that she is taking her Miralax every day and I have asked her to add Benefiber 2 tsp mixed in 8 ounces of liquid every day as well to help with bulking the stool.   CC:  [name], MD

## 2021-04-19 ENCOUNTER — Encounter (HOSPITAL_COMMUNITY): Payer: Self-pay | Admitting: Hematology

## 2021-04-23 ENCOUNTER — Ambulatory Visit (INDEPENDENT_AMBULATORY_CARE_PROVIDER_SITE_OTHER)
Admission: RE | Admit: 2021-04-23 | Discharge: 2021-04-23 | Disposition: A | Discharge status: Home / Self Care | Payer: Medicare Other | Source: Ambulatory Visit | Attending: Gastroenterology | Admitting: Gastroenterology

## 2021-04-23 ENCOUNTER — Other Ambulatory Visit: Payer: Self-pay

## 2021-04-23 DIAGNOSIS — R1013 Epigastric pain: Secondary | ICD-10-CM | POA: Diagnosis not present

## 2021-04-23 DIAGNOSIS — R109 Unspecified abdominal pain: Secondary | ICD-10-CM | POA: Diagnosis not present

## 2021-04-23 DIAGNOSIS — Z791 Long term (current) use of non-steroidal anti-inflammatories (NSAID): Secondary | ICD-10-CM

## 2021-04-23 DIAGNOSIS — R1084 Generalized abdominal pain: Secondary | ICD-10-CM

## 2021-04-23 DIAGNOSIS — K5900 Constipation, unspecified: Secondary | ICD-10-CM

## 2021-04-23 MED ORDER — IOHEXOL 350 MG/ML SOLN
80.0000 mL | Freq: Once | INTRAVENOUS | Status: AC | PRN
  Administered 2021-04-23: 80 mL via INTRAVENOUS

## 2021-04-24 DIAGNOSIS — K5900 Constipation, unspecified: Secondary | ICD-10-CM | POA: Insufficient documentation

## 2021-04-24 DIAGNOSIS — Z791 Long term (current) use of non-steroidal anti-inflammatories (NSAID): Secondary | ICD-10-CM | POA: Insufficient documentation

## 2021-04-24 DIAGNOSIS — R1013 Epigastric pain: Secondary | ICD-10-CM | POA: Insufficient documentation

## 2021-04-24 DIAGNOSIS — R1084 Generalized abdominal pain: Secondary | ICD-10-CM | POA: Insufficient documentation

## 2021-04-24 NOTE — Progress Notes (Signed)
Agree with assessment and plan as outlined.  

## 2021-05-01 ENCOUNTER — Ambulatory Visit (AMBULATORY_SURGERY_CENTER): Payer: Medicare Other | Admitting: Gastroenterology

## 2021-05-01 ENCOUNTER — Other Ambulatory Visit: Payer: Self-pay

## 2021-05-01 ENCOUNTER — Encounter: Payer: Self-pay | Admitting: Gastroenterology

## 2021-05-01 VITALS — BP 158/85 | HR 64 | Temp 97.1°F | Resp 15 | Ht 65.0 in | Wt 146.0 lb

## 2021-05-01 DIAGNOSIS — K449 Diaphragmatic hernia without obstruction or gangrene: Secondary | ICD-10-CM

## 2021-05-01 DIAGNOSIS — K2970 Gastritis, unspecified, without bleeding: Secondary | ICD-10-CM

## 2021-05-01 DIAGNOSIS — R1013 Epigastric pain: Secondary | ICD-10-CM

## 2021-05-01 DIAGNOSIS — K222 Esophageal obstruction: Secondary | ICD-10-CM

## 2021-05-01 DIAGNOSIS — K225 Diverticulum of esophagus, acquired: Secondary | ICD-10-CM

## 2021-05-01 DIAGNOSIS — K3189 Other diseases of stomach and duodenum: Secondary | ICD-10-CM | POA: Diagnosis not present

## 2021-05-01 DIAGNOSIS — Z791 Long term (current) use of non-steroidal anti-inflammatories (NSAID): Secondary | ICD-10-CM | POA: Diagnosis not present

## 2021-05-01 DIAGNOSIS — K319 Disease of stomach and duodenum, unspecified: Secondary | ICD-10-CM

## 2021-05-01 MED ORDER — SODIUM CHLORIDE 0.9 % IV SOLN
500.0000 mL | Freq: Once | INTRAVENOUS | Status: DC
Start: 2021-05-01 — End: 2021-05-01

## 2021-05-01 NOTE — Progress Notes (Signed)
Called to room to assist during endoscopic procedure.  Patient ID and intended procedure confirmed with present staff. Received instructions for my participation in the procedure from the performing physician.  

## 2021-05-01 NOTE — Progress Notes (Signed)
History and Physical Interval Note: Patient seen 04/12/21 for epigastric pain, history of NSAID use. Uses ibuprofen 800mg  BID for chronic pain. Has a history of gastritis, hiatal hernia, with Shatski ring dilated in 06/2015. She does endorse mild dysphagia at this time. Taking omeprazole 20mg  / day for most part, recently increased to twice daily. Have discussed EGD and risks with her and she wishes to proceed. Otherwise feels well without complaints. No interval changes since office visit.  05/01/2021 11:32 AM  Amanda Terrell  has presented today for endoscopic procedure(s), with the diagnosis of  Encounter Diagnoses  Name Primary?   Abdominal pain, epigastric Yes   NSAID long-term use    Dysphagia, unspecified type   .  The various methods of evaluation and treatment have been discussed with the patient and/or family. After consideration of risks, benefits and other options for treatment, the patient has consented to  the endoscopic procedure(s).   The patient's history has been reviewed, patient examined, no change in status, stable for surgery.  I have reviewed the patient's chart and labs.  Questions were answered to the patient's satisfaction.    Jolly Mango, MD [facility]

## 2021-05-01 NOTE — Op Note (Signed)
[facility] Patient Name: Amanda Terrell Procedure Date: 05/01/2021 11:21 AM MRN: [id number] Endoscopist: Remo Lipps P. Sheronda Parran , MD Age: 65 Referring MD:  Date of Birth: [birth year] Gender: Female Account #: [id number] Procedure:                Upper GI endoscopy Indications:              Epigastric abdominal pain, significant NSAID use                            (800mg  ibuprofen twice daily), history of hiatal                            hernia with rare dysphagia, CT scan recently done                            without any concerning pathology or cause for                            symptoms Medicines:                Monitored Anesthesia Care Procedure:                Pre-Anesthesia Assessment:                           - Prior to the procedure, a History and Physical                            was performed, and patient medications and                            allergies were reviewed. The patient's tolerance of                            previous anesthesia was also reviewed. The risks                            and benefits of the procedure and the sedation                            options and risks were discussed with the patient.                            All questions were answered, and informed consent                            was obtained. Prior Anticoagulants: The patient has                            taken no previous anticoagulant or antiplatelet                            agents. ASA Grade Assessment: II - A patient with  mild systemic disease. After reviewing the risks                            and benefits, the patient was deemed in                            satisfactory condition to undergo the procedure.                           After obtaining informed consent, the endoscope was                            passed under direct vision. Throughout the                            procedure, the patient's blood pressure, pulse,  and                            oxygen saturations were monitored continuously. The                            Endoscope was introduced through the mouth, and                            advanced to the second part of duodenum. The upper                            GI endoscopy was accomplished without difficulty.                            The patient tolerated the procedure well. Scope In: Scope Out: Findings:                 Esophagogastric landmarks were identified: the                            Z-line was found at 41 cm, the gastroesophageal                            junction was found at 41 cm and the upper extent of                            the gastric folds was found at 43 cm from the                            incisors.                           A 2 cm hiatal hernia was present.                           Widely patent / mild Shatski ring at the GEJ. The  exam of the esophagus was otherwise normal. No                            erosive changes or Barrett's esophagus.                           Patchy inflammation characterized by erythema and                            granularity was found in the gastric antrum. No                            focal ulceration noted.                           A diverticulum was found in the gastric fundus.                           The exam of the stomach was otherwise normal.                           Biopsies were taken with a cold forceps in the                            gastric body, at the incisura and in the gastric                            antrum for Helicobacter pylori testing.                           The duodenal bulb and second portion of the                            duodenum were normal. Complications:            No immediate complications. Estimated blood loss:                            Minimal. Estimated Blood Loss:     Estimated blood loss was minimal. Impression:               - Esophagogastric  landmarks identified.                           - 2 cm hiatal hernia.                           - Widely patent Shatski ring                           - Normal esophagus otherwise                           - Gastritis.                           - Gastric diverticulum.                           -  Normal stomach otherwise - biopsies taken to rule                            out H pylori                           - Normal duodenal bulb and second portion of the                            duodenum.                           Possible gastritis is contributing to the patient's                            symptoms. Patient at risk for persistent gastritis,                            enteroraphy and GI bleeding with continued high                            dose NSAID use, in addition to kidney disease, etc.                            Long term would recommend avoidance of routine high                            dose NSAIDs, can discuss with pain management                            provider. Recommendation:           - Patient has a contact number available for                            emergencies. The signs and symptoms of potential                            delayed complications were discussed with the                            patient. Return to normal activities tomorrow.                            Written discharge instructions were provided to the                            patient.                           - Resume previous diet.                           - Continue present medications (omeprazole twice  daily)                           - Try to minimize NSAID use if possible. There is a                            tylenol allergy listed in the chart however patient                            states she is not allergic, she just did not use it                            when she had hepatitis C in the past. If this is                            true can try  tylenol as needed, or consider                            increasing dose of gabapentin (if not at high dose                            already), defer to provider managing her chronic                            pain                           - Await pathology results. Remo Lipps P. Kelechi Orgeron, MD 05/01/2021 11:55:37 AM This report has been signed electronically.

## 2021-05-01 NOTE — Patient Instructions (Signed)
Thank you for letting us take care of your healthcare needs today. PLease see handouts given to you on Gastritis and Hiatal Hernia. Try to minimize NSAID use if possible and use Tylenol instead.   YOU HAD AN ENDOSCOPIC PROCEDURE TODAY AT [facility]:   Refer to the procedure report that was given to you for any specific questions about what was found during the examination.  If the procedure report does not answer your questions, please call your gastroenterologist to clarify.  If you requested that your care partner not be given the details of your procedure findings, then the procedure report has been included in a sealed envelope for you to review at your convenience later.  YOU SHOULD EXPECT: Some feelings of bloating in the abdomen. Passage of more gas than usual.  Walking can help get rid of the air that was put into your GI tract during the procedure and reduce the bloating. If you had a lower endoscopy (such as a colonoscopy or flexible sigmoidoscopy) you may notice spotting of blood in your stool or on the toilet paper. If you underwent a bowel prep for your procedure, you may not have a normal bowel movement for a few days.  Please Note:  You might notice some irritation and congestion in your nose or some drainage.  This is from the oxygen used during your procedure.  There is no need for concern and it should clear up in a day or so.  SYMPTOMS TO REPORT IMMEDIATELY:   Following upper endoscopy (EGD)  Vomiting of blood or coffee ground material  New chest pain or pain under the shoulder blades  Painful or persistently difficult swallowing  New shortness of breath  Fever of 100F or higher  Black, tarry-looking stools  For urgent or emergent issues, a gastroenterologist can be reached at any hour by calling [phone]. Do not use MyChart messaging for urgent concerns.    DIET:  We do recommend a small meal at first, but then you may proceed to your regular  diet.  Drink plenty of fluids but you should avoid alcoholic beverages for 24 hours.  ACTIVITY:  You should plan to take it easy for the rest of today and you should NOT DRIVE or use heavy machinery until tomorrow (because of the sedation medicines used during the test).    FOLLOW UP: Our staff will call the number listed on your records 48-72 hours following your procedure to check on you and address any questions or concerns that you may have regarding the information given to you following your procedure. If we do not reach you, we will leave a message.  We will attempt to reach you two times.  During this call, we will ask if you have developed any symptoms of COVID 19. If you develop any symptoms (ie: fever, flu-like symptoms, shortness of breath, cough etc.) before then, please call [phone].  If you test positive for Covid 19 in the 2 weeks post procedure, please call and report this information to [place].    If any biopsies were taken you will be contacted by phone or by letter within the next 1-3 weeks.  Please call us at [phone] if you have not heard about the biopsies in 3 weeks.    SIGNATURES/CONFIDENTIALITY: You and/or your care partner have signed paperwork which will be entered into your electronic medical record.  These signatures attest to the fact that that the information above on your After Visit Summary  has been reviewed and is understood.  Full responsibility of the confidentiality of this discharge information lies with you and/or your care-partner.  

## 2021-05-01 NOTE — Progress Notes (Signed)
Pt Drowsy. VSS. To PACU, report to RN. No anesthetic complications noted.  

## 2021-05-02 DIAGNOSIS — S93491A Sprain of other ligament of right ankle, initial encounter: Secondary | ICD-10-CM | POA: Diagnosis not present

## 2021-05-02 DIAGNOSIS — S83411A Sprain of medial collateral ligament of right knee, initial encounter: Secondary | ICD-10-CM | POA: Diagnosis not present

## 2021-05-02 DIAGNOSIS — M1711 Unilateral primary osteoarthritis, right knee: Secondary | ICD-10-CM | POA: Diagnosis not present

## 2021-05-03 ENCOUNTER — Telehealth: Payer: Self-pay | Admitting: *Deleted

## 2021-05-03 NOTE — Telephone Encounter (Signed)
  Follow up Call-  Call back number 05/01/2021  Post procedure Call Back phone  # [phone]  Permission to leave phone message Yes  Some recent data might be hidden     Patient questions:  Do you have a fever, pain , or abdominal swelling? No. Pain Score  0 *  Have you tolerated food without any problems? Yes.    Have you been able to return to your normal activities? Yes.    Do you have any questions about your discharge instructions: Diet   No. Medications  No. Follow up visit  No.  Do you have questions or concerns about your Care? No.  Actions: * If pain score is 4 or above: No action needed, pain <4.  Have you developed a fever since your procedure? no  2.   Have you had an respiratory symptoms (SOB or cough) since your procedure? no  3.   Have you tested positive for COVID 19 since your procedure no  4.   Have you had any family members/close contacts diagnosed with the COVID 19 since your procedure?  no   If yes to any of these questions please route to Joylene John, RN and Joella Prince, RN

## 2021-05-16 DIAGNOSIS — S83411D Sprain of medial collateral ligament of right knee, subsequent encounter: Secondary | ICD-10-CM | POA: Diagnosis not present

## 2021-05-16 DIAGNOSIS — S93491D Sprain of other ligament of right ankle, subsequent encounter: Secondary | ICD-10-CM | POA: Diagnosis not present

## 2021-05-16 DIAGNOSIS — M25571 Pain in right ankle and joints of right foot: Secondary | ICD-10-CM | POA: Diagnosis not present

## 2021-08-22 DIAGNOSIS — E663 Overweight: Secondary | ICD-10-CM | POA: Diagnosis not present

## 2021-08-22 DIAGNOSIS — G629 Polyneuropathy, unspecified: Secondary | ICD-10-CM | POA: Diagnosis not present

## 2021-08-22 DIAGNOSIS — G894 Chronic pain syndrome: Secondary | ICD-10-CM | POA: Diagnosis not present

## 2021-08-22 DIAGNOSIS — Z79899 Other long term (current) drug therapy: Secondary | ICD-10-CM | POA: Diagnosis not present

## 2021-09-13 ENCOUNTER — Other Ambulatory Visit (HOSPITAL_COMMUNITY): Payer: Self-pay | Admitting: Hematology

## 2021-09-13 DIAGNOSIS — Z1231 Encounter for screening mammogram for malignant neoplasm of breast: Secondary | ICD-10-CM

## 2021-09-13 DIAGNOSIS — M21371 Foot drop, right foot: Secondary | ICD-10-CM | POA: Diagnosis not present

## 2021-09-13 DIAGNOSIS — L97312 Non-pressure chronic ulcer of right ankle with fat layer exposed: Secondary | ICD-10-CM | POA: Diagnosis not present

## 2021-09-19 ENCOUNTER — Ambulatory Visit (HOSPITAL_COMMUNITY): Payer: Medicare Other

## 2021-09-24 DIAGNOSIS — L97312 Non-pressure chronic ulcer of right ankle with fat layer exposed: Secondary | ICD-10-CM | POA: Diagnosis not present

## 2021-09-26 ENCOUNTER — Other Ambulatory Visit: Payer: Self-pay

## 2021-09-26 ENCOUNTER — Ambulatory Visit (HOSPITAL_COMMUNITY)
Admission: RE | Admit: 2021-09-26 | Discharge: 2021-09-26 | Disposition: A | Discharge status: Home / Self Care | Payer: Medicare Other | Source: Ambulatory Visit | Attending: Hematology | Admitting: Hematology

## 2021-09-26 DIAGNOSIS — Z1231 Encounter for screening mammogram for malignant neoplasm of breast: Secondary | ICD-10-CM | POA: Diagnosis not present

## 2021-10-02 ENCOUNTER — Other Ambulatory Visit (HOSPITAL_COMMUNITY): Payer: Self-pay

## 2021-10-02 DIAGNOSIS — Z170 Estrogen receptor positive status [ER+]: Secondary | ICD-10-CM

## 2021-10-03 ENCOUNTER — Other Ambulatory Visit (HOSPITAL_COMMUNITY): Payer: Self-pay | Admitting: Physician Assistant

## 2021-10-03 ENCOUNTER — Inpatient Hospital Stay (HOSPITAL_COMMUNITY): Payer: Medicare Other | Attending: Hematology

## 2021-10-03 ENCOUNTER — Other Ambulatory Visit: Payer: Self-pay

## 2021-10-03 ENCOUNTER — Inpatient Hospital Stay (HOSPITAL_COMMUNITY): Payer: Medicare Other

## 2021-10-03 VITALS — BP 120/75 | HR 78 | Temp 98.7°F | Resp 18

## 2021-10-03 DIAGNOSIS — R109 Unspecified abdominal pain: Secondary | ICD-10-CM | POA: Insufficient documentation

## 2021-10-03 DIAGNOSIS — G479 Sleep disorder, unspecified: Secondary | ICD-10-CM | POA: Insufficient documentation

## 2021-10-03 DIAGNOSIS — Z79899 Other long term (current) drug therapy: Secondary | ICD-10-CM | POA: Diagnosis not present

## 2021-10-03 DIAGNOSIS — M549 Dorsalgia, unspecified: Secondary | ICD-10-CM | POA: Diagnosis not present

## 2021-10-03 DIAGNOSIS — Z170 Estrogen receptor positive status [ER+]: Secondary | ICD-10-CM | POA: Diagnosis not present

## 2021-10-03 DIAGNOSIS — C50412 Malignant neoplasm of upper-outer quadrant of left female breast: Secondary | ICD-10-CM | POA: Insufficient documentation

## 2021-10-03 DIAGNOSIS — R200 Anesthesia of skin: Secondary | ICD-10-CM | POA: Diagnosis not present

## 2021-10-03 DIAGNOSIS — M810 Age-related osteoporosis without current pathological fracture: Secondary | ICD-10-CM | POA: Insufficient documentation

## 2021-10-03 DIAGNOSIS — Z780 Asymptomatic menopausal state: Secondary | ICD-10-CM

## 2021-10-03 LAB — COMPREHENSIVE METABOLIC PANEL
ALT: 20 U/L (ref 0–44)
AST: 22 U/L (ref 15–41)
Albumin: 4.3 g/dL (ref 3.5–5.0)
Alkaline Phosphatase: 54 U/L (ref 38–126)
Anion gap: 8 (ref 5–15)
BUN: 19 mg/dL (ref 8–23)
CO2: 31 mmol/L (ref 22–32)
Calcium: 9.7 mg/dL (ref 8.9–10.3)
Chloride: 99 mmol/L (ref 98–111)
Creatinine, Ser: 0.66 mg/dL (ref 0.44–1.00)
GFR, Estimated: >60 mL/min (ref >60)
Glucose, Bld: 82 mg/dL (ref 70–99)
Potassium: 4.1 mmol/L (ref 3.5–5.1)
Sodium: 138 mmol/L (ref 135–145)
Total Bilirubin: 0.4 mg/dL (ref 0.3–1.2)
Total Protein: 7.4 g/dL (ref 6.5–8.1)

## 2021-10-03 MED ORDER — DENOSUMAB 60 MG/ML ~~LOC~~ SOSY
60.0000 mg | PREFILLED_SYRINGE | Freq: Once | SUBCUTANEOUS | Status: AC
  Administered 2021-10-03: 60 mg via SUBCUTANEOUS
  Filled 2021-10-03: qty 1

## 2021-10-03 NOTE — Progress Notes (Signed)
Amanda Terrell presents today for injection per the provider's orders.  Prolia '60mg'$  administration without incident; injection site WNL; see MAR for injection details.  Pt denies any tooth/jaw pain at this time. Pt reports taking Calcium and Vit D supplements as directed. No recent or future dental appointments.Patient tolerated procedure well and without incident.  No questions or complaints noted at this time.  ? ?Discharged from clinic ambulatory with canes in stable condition. Alert and oriented x 3. F/U with [facility] as scheduled.   ?

## 2021-10-03 NOTE — Patient Instructions (Signed)
[facility]  Discharge Instructions: ?Thank you for choosing [facility] to provide your oncology and hematology care.  ?If you have a lab appointment with the [facility], please come in thru the Main Entrance and check in at the main information desk. ? ?Wear comfortable clothing and clothing appropriate for easy access to any Portacath or PICC line.  ? ?We strive to give you quality time with your provider. You may need to reschedule your appointment if you arrive late (15 or more minutes).  Arriving late affects you and other patients whose appointments are after yours.  Also, if you miss three or more appointments without notifying the office, you may be dismissed from the clinic at the provider?s discretion.    ?  ?For prescription refill requests, have your pharmacy contact our office and allow 72 hours for refills to be completed.   ? ?Today you received Prolia injection. ?  ? ?BELOW ARE SYMPTOMS THAT SHOULD BE REPORTED IMMEDIATELY: ?*FEVER GREATER THAN 100.4 F (38 ?C) OR HIGHER ?*CHILLS OR SWEATING ?*NAUSEA AND VOMITING THAT IS NOT CONTROLLED WITH YOUR NAUSEA MEDICATION ?*UNUSUAL SHORTNESS OF BREATH ?*UNUSUAL BRUISING OR BLEEDING ?*URINARY PROBLEMS (pain or burning when urinating, or frequent urination) ?*BOWEL PROBLEMS (unusual diarrhea, constipation, pain near the anus) ?TENDERNESS IN MOUTH AND THROAT WITH OR WITHOUT PRESENCE OF ULCERS (sore throat, sores in mouth, or a toothache) ?UNUSUAL RASH, SWELLING OR PAIN  ?UNUSUAL VAGINAL DISCHARGE OR ITCHING  ? ?Items with * indicate a potential emergency and should be followed up as soon as possible or go to the Emergency Department if any problems should occur. ? ?Please show the CHEMOTHERAPY ALERT CARD or IMMUNOTHERAPY ALERT CARD at check-in to the Emergency Department and triage nurse. ? ?Should you have questions after your visit or need to cancel or reschedule your appointment, please contact [facility]  [phone]  and follow the prompts.  Office hours are 8:00 a.m. to 4:30 p.m. Monday - Friday. Please note that voicemails left after 4:00 p.m. may not be returned until the following business day.  We are closed weekends and major holidays. You have access to a nurse at all times for urgent questions. Please call the main number to the clinic [phone] and follow the prompts. ? ?For any non-urgent questions, you may also contact your provider using MyChart. We now offer e-Visits for anyone 29 and older to request care online for non-urgent symptoms. For details visit [email or web address]. ?  ?Also download the MyChart app! Go to the app store, search "MyChart", open the app, select [facility], and log in with your MyChart username and password. ? ?Due to Covid, a mask is required upon entering the hospital/clinic. If you do not have a mask, one will be given to you upon arrival. For doctor visits, patients may have 1 support person aged 67 or older with them. For treatment visits, patients cannot have anyone with them due to current Covid guidelines and our immunocompromised population.  ?

## 2021-10-16 DIAGNOSIS — M21371 Foot drop, right foot: Secondary | ICD-10-CM | POA: Diagnosis not present

## 2021-10-16 DIAGNOSIS — L97312 Non-pressure chronic ulcer of right ankle with fat layer exposed: Secondary | ICD-10-CM | POA: Diagnosis not present

## 2021-11-06 DIAGNOSIS — L97312 Non-pressure chronic ulcer of right ankle with fat layer exposed: Secondary | ICD-10-CM | POA: Diagnosis not present

## 2021-11-06 DIAGNOSIS — M21371 Foot drop, right foot: Secondary | ICD-10-CM | POA: Diagnosis not present

## 2021-11-20 ENCOUNTER — Other Ambulatory Visit (INDEPENDENT_AMBULATORY_CARE_PROVIDER_SITE_OTHER): Payer: Medicare Other

## 2021-11-20 ENCOUNTER — Encounter: Payer: Self-pay | Admitting: Gastroenterology

## 2021-11-20 ENCOUNTER — Ambulatory Visit: Payer: Medicare Other | Admitting: Gastroenterology

## 2021-11-20 VITALS — BP 110/80 | HR 84 | Ht 65.0 in | Wt 151.0 lb

## 2021-11-20 DIAGNOSIS — Z791 Long term (current) use of non-steroidal anti-inflammatories (NSAID): Secondary | ICD-10-CM | POA: Diagnosis not present

## 2021-11-20 DIAGNOSIS — R197 Diarrhea, unspecified: Secondary | ICD-10-CM

## 2021-11-20 DIAGNOSIS — M21371 Foot drop, right foot: Secondary | ICD-10-CM | POA: Diagnosis not present

## 2021-11-20 DIAGNOSIS — R194 Change in bowel habit: Secondary | ICD-10-CM | POA: Diagnosis not present

## 2021-11-20 DIAGNOSIS — L97312 Non-pressure chronic ulcer of right ankle with fat layer exposed: Secondary | ICD-10-CM | POA: Diagnosis not present

## 2021-11-20 LAB — CBC WITH DIFFERENTIAL/PLATELET
Basophils Absolute: 0.1 10*3/uL (ref 0.0–0.1)
Basophils Relative: 1 % (ref 0.0–3.0)
Eosinophils Absolute: 0.2 10*3/uL (ref 0.0–0.7)
Eosinophils Relative: 2.5 % (ref 0.0–5.0)
HCT: 44.4 % (ref 36.0–46.0)
Hemoglobin: 14.6 g/dL (ref 12.0–15.0)
Lymphocytes Relative: 21.8 % (ref 12.0–46.0)
Lymphs Abs: 1.4 10*3/uL (ref 0.7–4.0)
MCHC: 32.8 g/dL (ref 30.0–36.0)
MCV: 87.4 fl (ref 78.0–100.0)
Monocytes Absolute: 0.4 10*3/uL (ref 0.1–1.0)
Monocytes Relative: 6.9 % (ref 3.0–12.0)
Neutro Abs: 4.3 10*3/uL (ref 1.4–7.7)
Neutrophils Relative %: 67.8 % (ref 43.0–77.0)
Platelets: 244 10*3/uL (ref 150.0–400.0)
RBC: 5.08 Mil/uL (ref 3.87–5.11)
RDW: 14.8 % (ref 11.5–15.5)
WBC: 6.4 10*3/uL (ref 4.0–10.5)

## 2021-11-20 MED ORDER — LOPERAMIDE HCL 2 MG PO TABS: 2.0000 mg | ORAL_TABLET | ORAL | 0 refills | Status: DC | PRN

## 2021-11-20 MED ORDER — CITRUCEL PO POWD: 1.0000 | Freq: Every day | ORAL | Status: DC

## 2021-11-20 NOTE — Progress Notes (Signed)
? ?HPI :  ?66 year old female here for follow-up visit for altered bowel habits/diarrhea. ? ?She was seen by Alonza Bogus in September 2022 for reflux and upper abdominal discomfort.  This was in the setting of using NSAIDs chronically.  She had an EGD with me in October of this past year which showed a small hiatal hernia, widely patent Schatzki ring, mild gastritis.  Biopsy showed no evidence of H. pylori.  Gastritis thought to be due to NSAIDs.  She has been on omeprazole 20 mg once to twice daily and generally doing better in this regard. ? ?Main issue she has been having lately has been altered bowel habits.  She states her bowels have in fact been altered for the past year or so since last Easter.  Previously she was going a few days without having any bowel movements and then having significant episodes of large-volume stools.  Thought to be constipated, she was placed on MiraLAX daily and Metamucil/Benefiber daily. ?She states since being on this regimen she passes multiple loose stools per day.  She has urgency at times after she eats something to use the bathroom.  She feels that every time she urinates she passes some loose stool.  She will have occasional episodes where she cannot control and have accidents.  Despite having loose stools with MiraLAX on a routine basis she continues to take it.  She normally does not have nocturnal symptoms but that has happened in the past.  She continues to take ibuprofen 800 mg at least once to twice daily.  She denies any new medications since the symptoms started.  She denies any blood in her stools.  She feels a "knot" in her left side when she is out so much pain but a sensation she has around the time these episodes happen.  She last had a colonoscopy in 2018 which did not show any concerning pathology.  Diverticulosis noted. ? 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 ? ?She had a CT scan in September of the abdomen pelvis with contrast which  did not show any concerning pathology. ? ?Endoscopic history: ?EGD 07/12/2015 - mild Shatski ring at GEJ, dilated to 3m, 3cm HH, benign gastric polyps / diverticulum,  ?  ?Colonoscopy 03/26/2017 - pancolonic diverticulosis, internal hemorrhoids, solitary rectal ulcer - repeat colonoscopy in 10 years ?  ?EGD 05/2012 - no Barrett's, irregular zline ?EGD 04/2009 - distal  Esophageal stricture, no Barrett's ?EGD 04/2007 - 1cm suspected Barrett's however path negative for Barrett's, dilated to 48Fr ?EGD 04/2006 -  GEJ stricture, biopsies show (+) nondysplastic Barrett's, short segment 1cm or less ?Colonoscopy 01/2008 - hemorrhoids, diverticulosis, no polyps ? ? ? ?EGD 05/01/21 -  ?- A 2 cm hiatal hernia was present. ?- Widely patent / mild Shatski ring at the GEJ. The exam of the esophagus was otherwise ?normal. No erosive changes or Barrett's esophagus. ?- Patchy inflammation characterized by erythema and granularity was found in the gastric ?antrum. No focal ulceration noted. ?- A diverticulum was found in the gastric fundus. ?- The exam of the stomach was otherwise normal. ?- Biopsies were taken with a cold forceps in the gastric body, at the incisura and in the gastric ?antrum for Helicobacter pylori testing. ?- The duodenal bulb and second portion of the duodenum were normal. ? ?Surgical [P], gastric antrum and gastric body ?- ANTRAL MUCOSA WITH REACTIVE GASTROPATHY. ?- OXYNTIC MUCOSA WITH HYPEREMIA. ?- WARTHIN-STARRY NEGATIVE FOR HELICOBACTER PYLORI. ?- NO INTESTINAL METAPLASIA, DYSPLASIA OR CARCINOMA. ? ? ?  CT scan abdomen / pelvis 04/23/21 -  ?IMPRESSION: ?Colonic diverticulosis. No radiographic evidence of diverticulitis ?or other acute findings. ? ? ? ?Past Medical History:  ?Diagnosis Date  ? Arachnoiditis   ? after spinal surgery  ? Arthritis   ? Axillary abscess   ? left  ? Barrett's esophagus   ? Blood transfusion without reported diagnosis   ? 1989  ? Breast cancer of upper-outer quadrant of left female  breast ([place]) 07/11/2014  ? Cataract   ? both eyes in 2016  ? Cellulitis and abscess of unspecified site   ? Diverticulosis   ? Esophageal stricture   ? GERD (gastroesophageal reflux disease)   ? Heart murmur   ? Hepatitis C 1990  ? interferon treatment  ? Hiatal hernia   ? History of breast cancer   ? Hyperlipidemia   ? IBS (irritable bowel syndrome)   ? Internal hemorrhoids   ? Mitral valve prolapse   ? Neuropathy   ? Osteoporosis   ? Other specified disease of hair and hair follicles   ? Personal history of radiation therapy 2016  ? PONV (postoperative nausea and vomiting)   ? Postmenopausal 02/24/2018  ? 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/05/14-10/04/14  ? Left Breast DCIS  ? Reflex sympathetic dystrophy   ? Rosacea   ? Scoliosis   ? adhesive arachnoditis-right side  ? ? ? ?Past Surgical History:  ?Procedure Laterality Date  ? [place], 2006  ? due to scoliosis= total 5 back surgeries  ? BREAST FIBROADENOMA SURGERY    ? bilateral  ? BREAST LUMPECTOMY Left   ? BREAST LUMPECTOMY WITH RADIOACTIVE SEED LOCALIZATION Left 08/05/2014  ? Procedure: LEFT PARTICAL MASTECTOMY WITH RADIOACTIVE SEED LOCALIZATION;  Surgeon: Fanny Skates, MD;  Location: [facility];  Service: General;  Laterality: Left;  ? cataracts Bilateral   ? [place], [place], 1984  ? COLONOSCOPY  2009  ? HYSTEROSCOPY    ? INCISE AND DRAIN ABCESS    ? left axillary abscess  ? OSTECTOMY Right 09/08/2019  ? Procedure: OSTECTOMY OF DISTAL FIBULA RIGHT ANKLE AND DEBRIDEMENT OF ULCERATION RIGHT ANKLE;  Surgeon: Caprice Beaver, DPM;  Location: [facility];  Service: Podiatry;  Laterality: Right;  ? SPINAL FUSION    ? spinal surgeries    ? TONSILLECTOMY    ? UPPER GASTROINTESTINAL ENDOSCOPY    ? ?Family History  ?Problem Relation Age of Onset  ? Heart disease Mother   ? Brain cancer Father   ?     Glioblastoma  ? Melanoma Paternal Grandmother   ? Breast cancer Maternal Aunt   ?     in her 65s  ? Lung cancer Maternal Aunt   ? Colon cancer  Cousin   ? Stomach cancer Neg Hx   ? Rectal cancer Neg Hx   ? Esophageal cancer Neg Hx   ? ?Social History  ? ?Tobacco Use  ? Smoking status: Never  ? Smokeless tobacco: Never  ?Vaping Use  ? Vaping Use: Never used  ?Substance Use Topics  ? Alcohol use: Yes  ?  Comment: 1 glass wine a month at most  ? Drug use: No  ? ?Current Outpatient Medications  ?Medication Sig Dispense Refill  ? Ascorbic Acid (VITA-C PO) Take 1 tablet by mouth daily.     ? Calcium Carb-Cholecalciferol 1000-800 MG-UNIT TABS Take 1 tablet by mouth daily.     ? denosumab (PROLIA) 60 MG/ML SOSY injection Inject 60 mg into the skin  every 6 (six) months.    ? gabapentin (NEURONTIN) 300 MG capsule Take 300 mg by mouth 4 (four) times daily as needed (pain).     ? IBU 800 MG tablet Take 800 mg by mouth every 8 (eight) hours.    ? Multiple Vitamin (MULTIVITAMIN) tablet Take 1 tablet by mouth daily.     ? omeprazole (PRILOSEC) 20 MG capsule Take 1 capsule (20 mg total) by mouth 2 (two) times daily before a meal. 60 capsule 3  ? Polyethylene Glycol 3350 (MIRALAX PO) Take 17 g by mouth daily.    ? psyllium (METAMUCIL) 58.6 % powder Take 1 packet by mouth as needed.    ? Wheat Dextrin (BENEFIBER PO) Take by mouth daily as needed.    ? ?No current facility-administered medications for this visit.  ? ?Allergies  ?Allergen Reactions  ? Acetaminophen Other (See Comments)  ?  Liver sensitivity secondary to h/o hep. c  ? ? ? ?Review of Systems: ?All systems reviewed and negative except where noted in HPI.  ? ?Lab Results  ?Component Value Date  ? WBC 6.7 03/28/2021  ? HGB 15.1 (H) 03/28/2021  ? HCT 47.1 (H) 03/28/2021  ? MCV 91.6 03/28/2021  ? PLT 242 03/28/2021  ? ? ?Lab Results  ?Component Value Date  ? CREATININE 0.66 10/03/2021  ? BUN 19 10/03/2021  ? NA 138 10/03/2021  ? K 4.1 10/03/2021  ? CL 99 10/03/2021  ? CO2 31 10/03/2021  ? ?Lab Results  ?Component Value Date  ? ALT 20 10/03/2021  ? AST 22 10/03/2021  ? ALKPHOS 54 10/03/2021  ? BILITOT 0.4 10/03/2021   ? ? ? ? ?Physical Exam: ?BP 110/80   Pulse 84   Ht '5\' 5"'$  (1.651 m)   Wt 151 lb (68.5 kg)   BMI 25.13 kg/m?  ?Constitutional: Pleasant,well-developed, female in no acute distress. ?Neurological: Alert and orien

## 2021-11-20 NOTE — Patient Instructions (Addendum)
If you are age 68 or older, your body mass index should be between 23-30. Your Body mass index is 25.13 kg/m?Marland Kitchen If this is out of the aforementioned range listed, please consider follow up with your Primary Care Provider. ? ?If you are age 71 or younger, your body mass index should be between 19-25. Your Body mass index is 25.13 kg/m?Marland Kitchen If this is out of the aformentioned range listed, please consider follow up with your Primary Care Provider.  ? ?________________________________________________________ ? ?The [facility] providers would like to encourage you to use [facility] to communicate with providers for non-urgent requests or questions.  Due to long hold times on the telephone, sending your provider a message by [facility] may be a faster and more efficient way to get a response.  Please allow 48 business hours for a response.  Please remember that this is for non-urgent requests.  ?_______________________________________________________ ? ?Please go to the lab in the basement of our building to have lab work done as you leave today. Hit "B" for basement when you get on the elevator.  When the doors open the lab is on your left.  We will call you with the results. Thank you. ? ?Please purchase the following medications over the counter and take as directed: ?Citrucel - Take as directed once daily ?Stop Miralax, Benefiber and Metamucil. ? ?Stop Ibuprofen. ? ?Take Imodium as needed. ? ?Please give [place] an update in a few weeks. ? ?Thank you for entrusting me with your care and for choosing [facility], ?Dr. [place] Cellar ? ? ? ? ? ? ?

## 2021-11-21 LAB — TISSUE TRANSGLUTAMINASE, IGA: (tTG) Ab, IgA: <1 U/mL

## 2021-11-21 LAB — TSH: TSH: 2.59 u[IU]/mL (ref 0.35–5.50)

## 2021-11-21 LAB — IGA: Immunoglobulin A: 114 mg/dL (ref 70–320)

## 2021-11-23 ENCOUNTER — Other Ambulatory Visit: Payer: Medicare Other

## 2021-11-23 DIAGNOSIS — R197 Diarrhea, unspecified: Secondary | ICD-10-CM | POA: Diagnosis not present

## 2021-11-27 ENCOUNTER — Telehealth: Payer: Self-pay

## 2021-11-27 LAB — FECAL LACTOFERRIN, QUANT
Fecal Lactoferrin: NEGATIVE
MICRO NUMBER:: 13326629
SPECIMEN QUALITY:: ADEQUATE

## 2021-11-27 MED ORDER — OMEPRAZOLE 20 MG PO CPDR
20.0000 mg | DELAYED_RELEASE_CAPSULE | Freq: Two times a day (BID) | ORAL | 5 refills | Status: DC
Start: 2021-11-27 — End: 2023-02-05

## 2021-11-27 NOTE — Telephone Encounter (Signed)
Called patient to discuss results that had not been read via [facility].  Relayed the following from Dr. Havery Moros: ? ?"This message is to relay that your lab work is normal. You tested negative for celiac disease. Your thyroid level is normal, your blood counts do not show any anemia or elevated white blood cell count. All reassuring. Let's see how you do on the regimen we discussed in the clinic recently, and you can touch base with me in a few weeks with an update on your progress. Please let me know if any questions in the interim."  Patient expressed understanding. ?She requested a refill on her omeprazole 20 mg Bid to be sent to [facility].  Refill sent. ? ?

## 2021-12-04 DIAGNOSIS — M21371 Foot drop, right foot: Secondary | ICD-10-CM | POA: Diagnosis not present

## 2021-12-04 DIAGNOSIS — L97312 Non-pressure chronic ulcer of right ankle with fat layer exposed: Secondary | ICD-10-CM | POA: Diagnosis not present

## 2021-12-06 ENCOUNTER — Telehealth: Payer: Self-pay | Admitting: Gastroenterology

## 2021-12-06 NOTE — Telephone Encounter (Signed)
Patient called stating she had an episode with diarrhea today - out of the blue with no warning.  She has not been taking Miralax, only Citrucel, one tablet a day.  Please call patient and advise. ?

## 2021-12-07 NOTE — Telephone Encounter (Signed)
Returned call to patient. Pt reports that she had another "episode" yesterday. She reports that she had loose stools and gas, she describes it as an "explosion". She reports that this stool was large volume. Pt reports that she did not have any precipitating pain prior to this episode and no other warning signs. Pt reports that her abdomen is puffy at the top of her stomach, but she does not feel bloated. She reports that after the BM yesterday her abdomen was tender when she pressed it and she attributed this to the amount of stools that she had passed. Pt reports that she had another BM around 4 am today, describes as a soft formed skinny stool about the size of her pinky finger. Pt states that she is eating and drinking fine. No change in diet. She reports that these episodes seem to happen a few weeks apart. Last episode was right before her office visit with you. Pt reports that she has been taking Citrucel once a day. She likes this better than Benefiber/Metamucil. She is not taking Miralax. She states that she has not been taking Imodium PRN because she does not know when she is going to have an episode. She does not want to take Imodium daily due to possible constipation. Pt is seeking further recommendations. Please advise, thanks.  ?

## 2021-12-07 NOTE — Telephone Encounter (Signed)
Sorry to hear this.  I am sure this is very frustrating for her.  Can she clarify how much Imodium she has been taking?  If she is taking it fairly frequently outside of these episodes, could be leading to constipation and overflow.  I would like to see how she does on Citrucel alone.  If she needs to take Imodium when out in a public place etc. and not near a bathroom that is okay, however would not take it routinely, we can give it more time with the Citrucel monotherapy to see how she does.  The next step for further evaluation for her will be with a colonoscopy as I discussed with her in the office.  If she feels she is at that point and does not think she is making a significant improvement on the Citrucel then please schedule her for colonoscopy.  Thanks ?

## 2021-12-07 NOTE — Telephone Encounter (Signed)
Called and spoke with patient regarding Dr. Doyne Keel recommendations. Pt reports that she has not taken Imodium at all. She has been taking Citrucel tablet daily alone with water. Pt states that she has been doing that for the past 3 weeks.Pt was offered to schedule appt today but she states that her daughter will be coming from [place] on 01/04/22. Pt will try Citrucel monotherapy for an additional week and if no improvement she will call to schedule a colonoscopy. Pt had no concerns at the end of the call. ?

## 2021-12-20 DIAGNOSIS — M21371 Foot drop, right foot: Secondary | ICD-10-CM | POA: Diagnosis not present

## 2021-12-20 DIAGNOSIS — L97312 Non-pressure chronic ulcer of right ankle with fat layer exposed: Secondary | ICD-10-CM | POA: Diagnosis not present

## 2022-01-10 DIAGNOSIS — M21371 Foot drop, right foot: Secondary | ICD-10-CM | POA: Diagnosis not present

## 2022-01-10 DIAGNOSIS — L97312 Non-pressure chronic ulcer of right ankle with fat layer exposed: Secondary | ICD-10-CM | POA: Diagnosis not present

## 2022-01-13 IMAGING — US US BREAST*R* LIMITED INC AXILLA
1 series · 13 of 19 positions shown · non-contrast
Comparison: Previous exam(s).

CLINICAL DATA: Patient recalled from screening for right breast
calcifications.

EXAM:
DIGITAL DIAGNOSTIC UNILATERAL RIGHT MAMMOGRAM WITH TOMOSYNTHESIS AND
CAD; ULTRASOUND RIGHT BREAST LIMITED
TECHNIQUE: Right digital diagnostic mammography and breast tomosynthesis was
performed. The images were evaluated with computer-aided detection.;
Targeted ultrasound examination of the right breast was performed

[Series 1: us breast*right* limited inc axilla · 0.07mm/px · 13 of 19 slices shown]
[im 1/19]
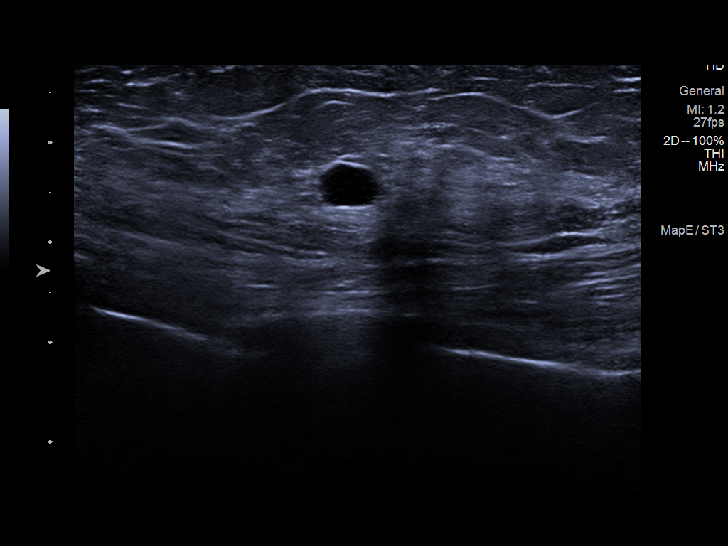
[im 3/19]
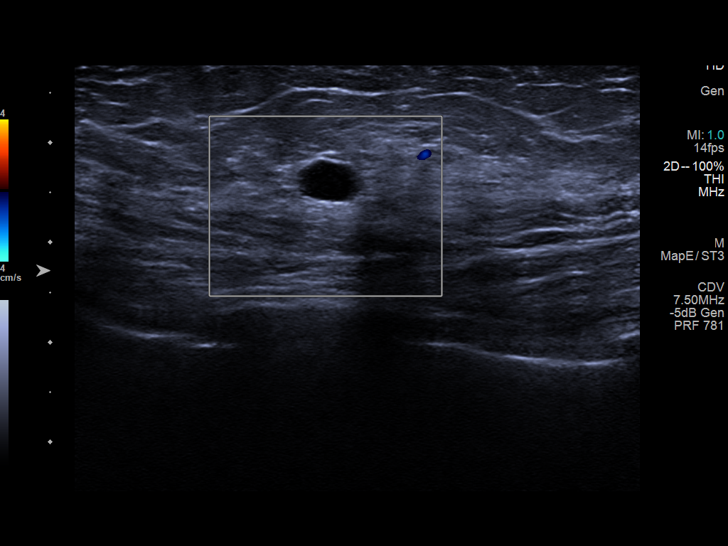
[im 4/19]
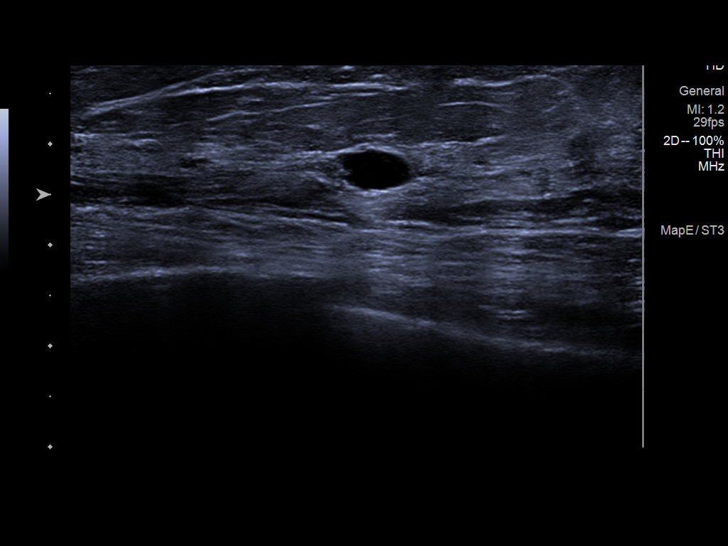
[im 6/19]
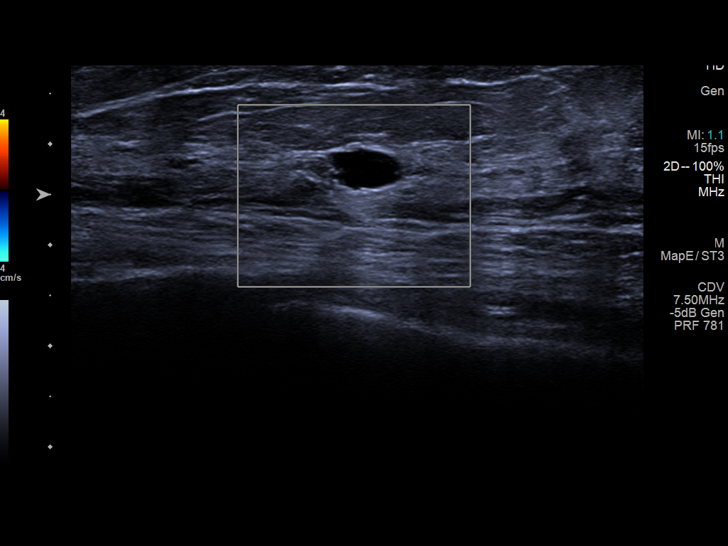
[im 7/19]
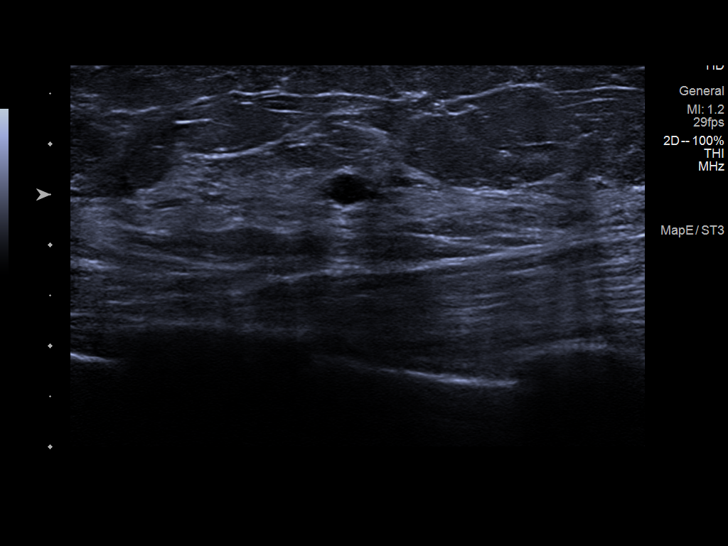
[im 9/19]
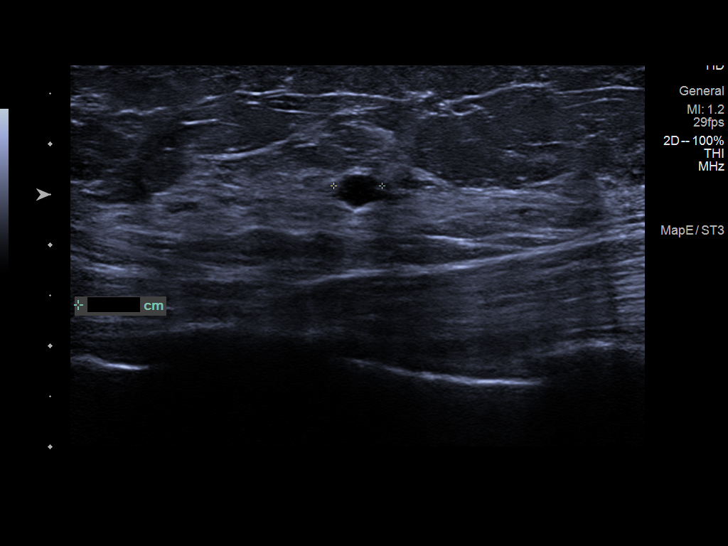
[im 10/19]
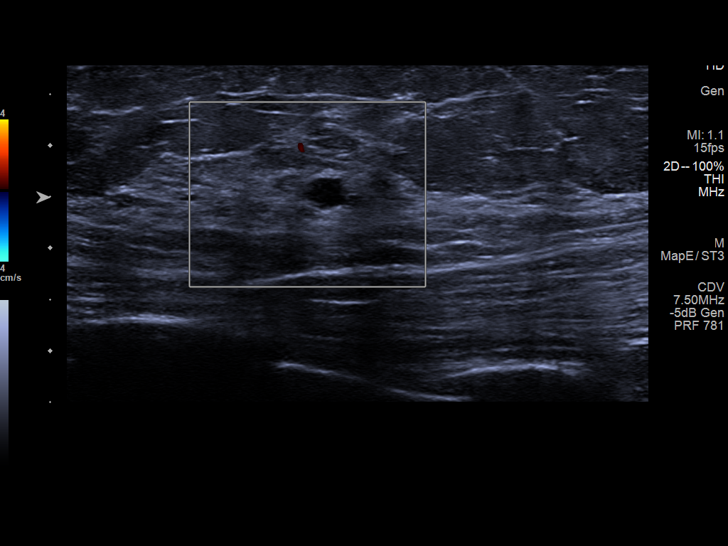
[im 11/19]
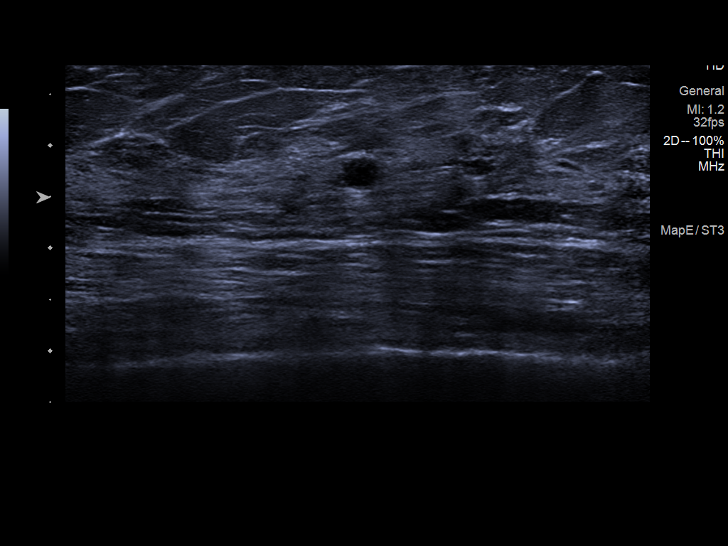
[im 13/19]
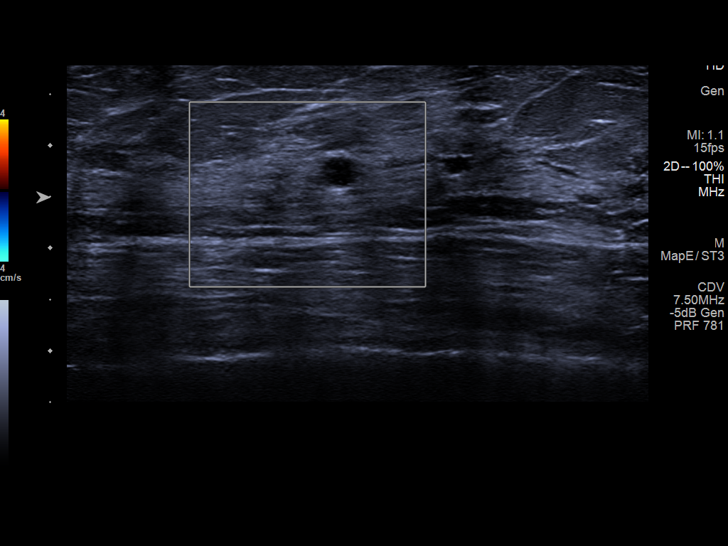
[im 14/19]
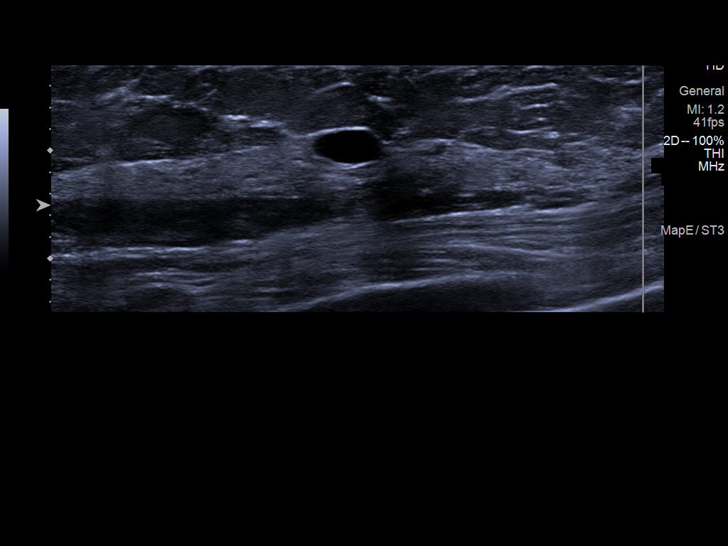
[im 16/19]
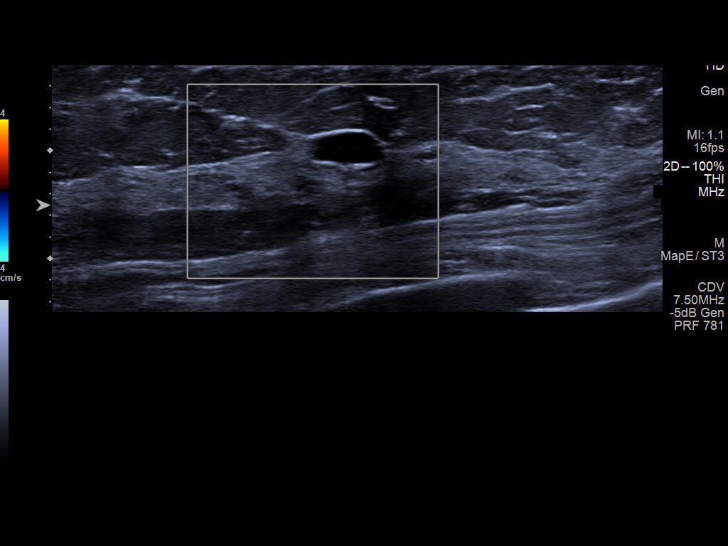
[im 17/19]
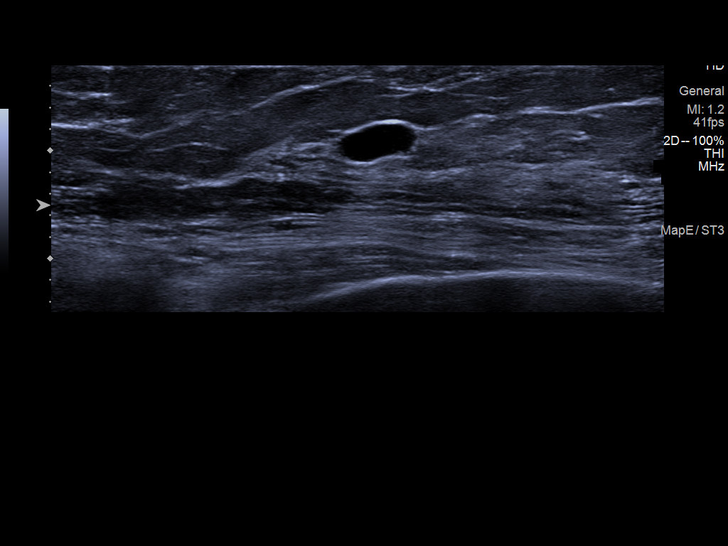
[im 19/19]
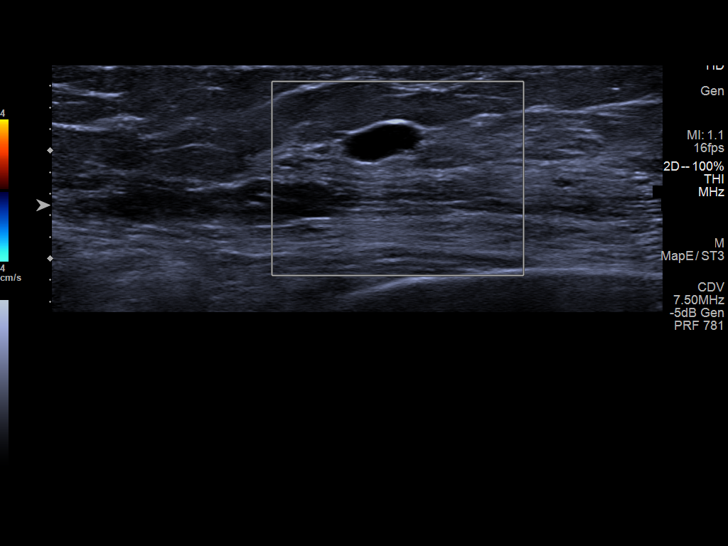

[13 of 19 positions shown; findings below may reference images not displayed]

ACR Breast Density Category c: The breast tissue is heterogeneously
dense, which may obscure small masses.
FINDINGS: Magnification cc and true lateral views of the right breast were
obtained. There are diffuse calcifications throughout the right
breast which demonstrate layering on the true lateral view most
compatible with milk calcium.

Within the outer right breast posterior depth there is a persistent
oval circumscribed mass.

Targeted ultrasound is performed, showing a 7 x 8 x 5 mm cyst right
breast 9 o'clock position 2 cm from nipple.

from nipple. There is a 7 x 8 x 3 mm cyst right breast 8 o'clock
position 3 cm nipple.
IMPRESSION: Right breast milk calcium.

Multiple cysts right breast.

RECOMMENDATION:
Screening mammogram in one year.(Code:IE-G-CT6)

I have discussed the findings and recommendations with the patient.
If applicable, a reminder letter will be sent to the patient
regarding the next appointment.

BI-RADS CATEGORY  2: Benign.

## 2022-01-31 DIAGNOSIS — M21371 Foot drop, right foot: Secondary | ICD-10-CM | POA: Diagnosis not present

## 2022-01-31 DIAGNOSIS — G629 Polyneuropathy, unspecified: Secondary | ICD-10-CM | POA: Diagnosis not present

## 2022-01-31 DIAGNOSIS — L97312 Non-pressure chronic ulcer of right ankle with fat layer exposed: Secondary | ICD-10-CM | POA: Diagnosis not present

## 2022-01-31 DIAGNOSIS — G894 Chronic pain syndrome: Secondary | ICD-10-CM | POA: Diagnosis not present

## 2022-01-31 DIAGNOSIS — E663 Overweight: Secondary | ICD-10-CM | POA: Diagnosis not present

## 2022-01-31 DIAGNOSIS — R030 Elevated blood-pressure reading, without diagnosis of hypertension: Secondary | ICD-10-CM | POA: Diagnosis not present

## 2022-02-11 ENCOUNTER — Telehealth: Payer: Self-pay | Admitting: Gastroenterology

## 2022-02-11 NOTE — Telephone Encounter (Signed)
Patient called to scheduled colonoscopy appointment with Dr. Havery Moros.  She last saw him in April.  She is having no issues but when she was seen she didn't have her calendar to look at dates.  As she was last seen in April, she was scheduled a PV and procedure.  Let me know if this is OK.  If this is not correct, please call patient and advise otherwise.  Thank you.

## 2022-02-11 NOTE — Telephone Encounter (Signed)
Her last colonoscopy was in 2018 without polyps. We had discussed that if her diarrhea persists, we can proceed with colonoscopy. If she is still having symptoms, yes, can schedule it. If her symptoms resolved, however, and she is feeling back to normal, then she would not need the exam. Thanks

## 2022-02-12 NOTE — Telephone Encounter (Signed)
Called and spoke with patient's husband, Amanda Terrell. He states that patient is still having symptoms of not having a BM and then she will have diarrhea. I told Amanda Terrell that pt should keep her colonoscopy appt. Amanda Terrell stated that they would be going out of town the week of the previsit appt and wanted to know if we could reschedule it to this week. Pt's PV appt has been rescheduled to Thursday, 02/14/22 at 10:30 am. Amanda Terrell is aware that they will need to check in on the 2nd floor. [name] verbalized understanding and had no concerns at the end of the call.

## 2022-02-14 ENCOUNTER — Ambulatory Visit (AMBULATORY_SURGERY_CENTER): Payer: Medicare Other | Admitting: *Deleted

## 2022-02-14 VITALS — Ht 65.0 in | Wt 147.0 lb

## 2022-02-14 DIAGNOSIS — R197 Diarrhea, unspecified: Secondary | ICD-10-CM

## 2022-02-14 DIAGNOSIS — L97319 Non-pressure chronic ulcer of right ankle with unspecified severity: Secondary | ICD-10-CM | POA: Diagnosis not present

## 2022-02-14 DIAGNOSIS — R194 Change in bowel habit: Secondary | ICD-10-CM

## 2022-02-14 DIAGNOSIS — M21371 Foot drop, right foot: Secondary | ICD-10-CM | POA: Diagnosis not present

## 2022-02-14 MED ORDER — NA SULFATE-K SULFATE-MG SULF 17.5-3.13-1.6 GM/177ML PO SOLN: 1.0000 | ORAL | 0 refills | Status: DC

## 2022-02-14 NOTE — Progress Notes (Signed)
Patient is here in-person for PV. Patient denies any allergies to eggs or soy. Patient denies any problems with anesthesia/sedation. Patient is not on any oxygen at home. Patient is not taking any diet/weight loss medications or blood thinners. Patient denies any medical chart hx changes since last GI OV. Went over procedure prep instructions with the patient. Patient is aware of our care-partner policy. Patient notified to use Single[employer] card given for prescription.

## 2022-02-21 DIAGNOSIS — M21371 Foot drop, right foot: Secondary | ICD-10-CM | POA: Diagnosis not present

## 2022-02-21 DIAGNOSIS — L97319 Non-pressure chronic ulcer of right ankle with unspecified severity: Secondary | ICD-10-CM | POA: Diagnosis not present

## 2022-02-25 ENCOUNTER — Ambulatory Visit
Admission: EM | Admit: 2022-02-25 | Discharge: 2022-02-25 | Disposition: A | Discharge status: Home / Self Care | Payer: Medicare Other | Attending: Nurse Practitioner | Admitting: Nurse Practitioner

## 2022-02-25 DIAGNOSIS — H6982 Other specified disorders of Eustachian tube, left ear: Secondary | ICD-10-CM | POA: Diagnosis not present

## 2022-02-25 MED ORDER — FLUTICASONE PROPIONATE 50 MCG/ACT NA SUSP: 2.0000 | Freq: Every day | NASAL | 0 refills | Status: DC

## 2022-02-25 NOTE — Discharge Instructions (Signed)
-   Please start the nasal spray and take it twice daily for the next 1 to 2 weeks -Recommend following up with Dr. Benjamine Mola sooner than October with no improvement

## 2022-02-25 NOTE — ED Provider Notes (Signed)
[facility]    CSN: [id number] Arrival date & time: 02/25/22  1519      History   Chief Complaint Chief Complaint  Patient presents with   Ear Fullness    HPI Amanda Terrell is a 66 y.o. female.   Patient presents with left ear fullness and decreased hearing that began a couple of days ago.  She denies any fever, cough, congestion, sore throat, ear drainage.  Denies Q-tip use on a regular basis.  Reports she has dealt with this before, has had vertigo in the past, ringing in the ears.  Follows with ENT on a regular basis.  Has not tried anything so far for the fullness/decreased hearing.    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   Arthritis    Axillary abscess    left   Barrett's esophagus    Blood transfusion without reported diagnosis    1989   Breast cancer of upper-outer quadrant of left female breast ([place]) 07/11/2014   Cataract    both eyes in 2016   Cellulitis and abscess of unspecified site    Diverticulosis    Esophageal stricture    GERD (gastroesophageal reflux disease)    Heart murmur    Hepatitis C 1990   interferon treatment   Hiatal hernia    History of breast cancer    Hyperlipidemia    IBS (irritable bowel syndrome)    Internal hemorrhoids    Mitral valve prolapse    Neuropathy    Osteoporosis    Other specified disease of hair and hair follicles    Personal history of radiation therapy 2016   PONV (postoperative nausea and vomiting)    Postmenopausal 02/24/2018   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/05/14-10/04/14   Left Breast DCIS   Reflex sympathetic dystrophy    Rosacea    Scoliosis    adhesive arachnoditis-right side    Patient Active Problem List   Diagnosis Date Noted   NSAID long-term use 04/24/2021   Abdominal pain, epigastric 04/24/2021   Constipation 04/24/2021   Generalized abdominal pain 04/24/2021   Postmenopausal 02/24/2018   Pain syndrome, chronic 08/06/2015   Lower extremity pain, left  04/12/2015   Osteoporosis 11/21/2014   Breast cancer of upper-outer quadrant of left female breast ([place]) 07/11/2014   Abscess of axilla, left 06/27/2011   HYPERLIPIDEMIA 01/26/2008   REFLEX SYMPATHETIC DYSTROPHY 01/26/2008   NEUROPATHY 01/26/2008   HEMORRHOIDS, INTERNAL 01/26/2008   ESOPHAGEAL STRICTURE 01/26/2008   GERD 01/26/2008   BARRETTS ESOPHAGUS 01/26/2008   DIVERTICULOSIS, COLON 01/26/2008   IRRITABLE BOWEL SYNDROME 01/26/2008   SCOLIOSIS 01/26/2008   HEPATITIS C, HX OF 01/26/2008   MITRAL VALVE PROLAPSE, HX OF 01/26/2008    Past Surgical History:  Procedure Laterality Date   [place], 2006   due to scoliosis= total 5 back surgeries   BREAST FIBROADENOMA SURGERY     bilateral   BREAST LUMPECTOMY Left    BREAST LUMPECTOMY WITH RADIOACTIVE SEED LOCALIZATION Left 08/05/2014   Procedure: LEFT PARTICAL MASTECTOMY WITH RADIOACTIVE SEED LOCALIZATION;  Surgeon: Fanny Skates, MD;  Location: [place];  Service: General;  Laterality: Left;   cataracts Bilateral    [place]   COLONOSCOPY  2009   COLONOSCOPY WITH PROPOFOL  2018   Dr.Armbruster   HYSTEROSCOPY     INCISE AND DRAIN ABCESS     left axillary abscess   OSTECTOMY Right  09/08/2019   Procedure: OSTECTOMY OF DISTAL FIBULA RIGHT ANKLE AND DEBRIDEMENT OF ULCERATION RIGHT ANKLE;  Surgeon: Caprice Beaver, DPM;  Location: [facility];  Service: Podiatry;  Laterality: Right;   SPINAL FUSION     spinal surgeries     TONSILLECTOMY     UPPER GASTROINTESTINAL ENDOSCOPY      OB History     Gravida  4   Para  3   Term      Preterm      AB      Living  3      SAB      IAB      Ectopic      Multiple      Live Births               Home Medications    Prior to Admission medications   Medication Sig Start Date End Date Taking? Authorizing Provider  fluticasone (FLONASE) 50 MCG/ACT nasal spray Place 2 sprays into both nostrils daily.  02/25/22  Yes Eulogio Bear, NP  acetaminophen (TYLENOL) 500 MG tablet Take 500 mg by mouth every 6 (six) hours as needed.    [provider]  Ascorbic Acid (VITA-C PO) Take 1 tablet by mouth daily.     [provider]  Calcium Carb-Cholecalciferol 1000-800 MG-UNIT TABS Take 1 tablet by mouth daily.     [provider]  denosumab (PROLIA) 60 MG/ML SOSY injection Inject 60 mg into the skin every 6 (six) months.    [provider]  gabapentin (NEURONTIN) 300 MG capsule Take 300 mg by mouth 4 (four) times daily as needed (pain).     [provider]  ibuprofen (ADVIL) 200 MG tablet Take 200 mg by mouth every 6 (six) hours as needed.    [provider]  loperamide (IMODIUM A-D) 2 MG tablet Take 1 tablet (2 mg total) by mouth as needed for diarrhea or loose stools. Patient not taking: Reported on 02/14/2022 11/20/21   Yetta Flock, MD  Methylcellulose, Laxative, (CITRUCEL PO) Take by mouth.    [provider]  Multiple Vitamin (MULTIVITAMIN) tablet Take 1 tablet by mouth daily.     [provider]  Na Sulfate-K Sulfate-Mg Sulf 17.5-3.13-1.6 GM/177ML SOLN Take 1 kit by mouth as directed. May use generic SUPREP;NO prior authorizations will be done.Please use Singlecare or [employer]-RX coupon. 02/14/22   Armbruster, Carlota Raspberry, MD  omeprazole (PRILOSEC) 20 MG capsule Take 1 capsule (20 mg total) by mouth 2 (two) times daily before a meal. 11/27/21   Armbruster, Carlota Raspberry, MD    Family History Family History  Problem Relation Age of Onset   Heart disease Mother    Brain cancer Father        Glioblastoma   Melanoma Paternal Grandmother    Breast cancer Maternal Aunt        in her 58s   Lung cancer Maternal Aunt    Colon cancer Cousin    Stomach cancer Neg Hx    Rectal cancer Neg Hx    Esophageal cancer Neg Hx     Social History Social History   Tobacco Use   Smoking status: Never   Smokeless tobacco: Never  Vaping Use    Vaping Use: Never used  Substance Use Topics   Alcohol use: Yes    Comment: 1 glass wine a month at most   Drug use: No     Allergies   Acetaminophen   Review of  Systems Review of Systems Per HPI  Physical Exam Triage Vital Signs ED Triage Vitals  Enc Vitals Group     BP 02/25/22 1537 133/81     Pulse Rate 02/25/22 1537 72     Resp 02/25/22 1537 18     Temp 02/25/22 1537 98 F (36.7 C)     Temp src --      SpO2 02/25/22 1537 95 %     Weight --      Height --      Head Circumference --      Peak Flow --      Pain Score 02/25/22 1534 0     Pain Loc --      Pain Edu? --      Excl. in [place]? --    No data found.  Updated Vital Signs BP 133/81   Pulse 72   Temp 98 F (36.7 C)   Resp 18   SpO2 95%   Visual Acuity Right Eye Distance:   Left Eye Distance:   Bilateral Distance:    Right Eye Near:   Left Eye Near:    Bilateral Near:     Physical Exam Vitals and nursing note reviewed.  Constitutional:      General: She is not in acute distress.    Appearance: Normal appearance. She is not toxic-appearing.  HENT:     Right Ear: No decreased hearing noted. No drainage, swelling or tenderness. No middle ear effusion. There is no impacted cerumen. Tympanic membrane is not perforated, erythematous or bulging.     Left Ear: Decreased hearing noted. No drainage, swelling or tenderness. A middle ear effusion is present. There is no impacted cerumen. Tympanic membrane is not perforated, erythematous or bulging.     Nose: Nose normal. No congestion.     Mouth/Throat:     Mouth: Mucous membranes are moist.     Pharynx: Oropharynx is clear.  Pulmonary:     Effort: Pulmonary effort is normal. No respiratory distress.  Musculoskeletal:     Cervical back: Normal range of motion.  Lymphadenopathy:     Cervical: No cervical adenopathy.  Skin:    General: Skin is warm and dry.     Coloration: Skin is not jaundiced or pale.     Findings: No erythema.  Neurological:      Mental Status: She is alert and oriented to person, place, and time.  Psychiatric:        Behavior: Behavior is cooperative.      UC Treatments / Results  Labs (all labs ordered are listed, but only abnormal results are displayed) Labs Reviewed - No data to display  EKG   Radiology No results found.  Procedures Procedures (including critical care time)  Medications Ordered in UC Medications - No data to display  Initial Impression / Assessment and Plan / UC Course  I have reviewed the triage vital signs and the nursing notes.  Pertinent labs & imaging results that were available during my care of the patient were reviewed by me and considered in my medical decision making (see chart for details).    Patient is a pleasant, well-appearing 66 year old female presenting for left ear fullness.  On examination, she has an effusion of her left tympanic membrane.  Treat for eustachian tube dysfunction with Flonase nasal spray.  Recommended follow-up with ENT sooner than October if symptoms do not improve despite this medicine.  ER precautions discussed.  The patient was given the opportunity to  ask questions.  All questions answered to their satisfaction.  The patient is in agreement to this plan.   Final Clinical Impressions(s) / UC Diagnoses   Final diagnoses:  Acute dysfunction of left eustachian tube     Discharge Instructions      - Please start the nasal spray and take it twice daily for the next 1 to 2 weeks -Recommend following up with Dr. Benjamine Mola sooner than October with no improvement    ED Prescriptions     Medication Sig Dispense Auth. Provider   fluticasone (FLONASE) 50 MCG/ACT nasal spray Place 2 sprays into both nostrils daily. 16 g Eulogio Bear, NP      PDMP not reviewed this encounter.   Eulogio Bear, NP 02/25/22 [phone]

## 2022-02-25 NOTE — ED Triage Notes (Signed)
Pt presents with c/o ear fullness and hearing loss and left ear

## 2022-02-28 DIAGNOSIS — L97312 Non-pressure chronic ulcer of right ankle with fat layer exposed: Secondary | ICD-10-CM | POA: Diagnosis not present

## 2022-02-28 DIAGNOSIS — M21371 Foot drop, right foot: Secondary | ICD-10-CM | POA: Diagnosis not present

## 2022-03-07 DIAGNOSIS — L97312 Non-pressure chronic ulcer of right ankle with fat layer exposed: Secondary | ICD-10-CM | POA: Diagnosis not present

## 2022-03-07 DIAGNOSIS — M21371 Foot drop, right foot: Secondary | ICD-10-CM | POA: Diagnosis not present

## 2022-03-12 ENCOUNTER — Encounter: Payer: Self-pay | Admitting: Gastroenterology

## 2022-03-12 ENCOUNTER — Encounter: Payer: Self-pay | Admitting: Certified Registered Nurse Anesthetist

## 2022-03-14 DIAGNOSIS — M21371 Foot drop, right foot: Secondary | ICD-10-CM | POA: Diagnosis not present

## 2022-03-14 DIAGNOSIS — L97312 Non-pressure chronic ulcer of right ankle with fat layer exposed: Secondary | ICD-10-CM | POA: Diagnosis not present

## 2022-03-19 ENCOUNTER — Encounter: Payer: Self-pay | Admitting: Gastroenterology

## 2022-03-19 ENCOUNTER — Ambulatory Visit (AMBULATORY_SURGERY_CENTER): Payer: Medicare Other | Admitting: Gastroenterology

## 2022-03-19 VITALS — BP 147/89 | HR 68 | Temp 96.9°F | Resp 14 | Ht 65.0 in | Wt 147.0 lb

## 2022-03-19 DIAGNOSIS — K635 Polyp of colon: Secondary | ICD-10-CM | POA: Diagnosis not present

## 2022-03-19 DIAGNOSIS — K5730 Diverticulosis of large intestine without perforation or abscess without bleeding: Secondary | ICD-10-CM

## 2022-03-19 DIAGNOSIS — D120 Benign neoplasm of cecum: Secondary | ICD-10-CM | POA: Diagnosis not present

## 2022-03-19 DIAGNOSIS — D127 Benign neoplasm of rectosigmoid junction: Secondary | ICD-10-CM

## 2022-03-19 DIAGNOSIS — D123 Benign neoplasm of transverse colon: Secondary | ICD-10-CM | POA: Diagnosis not present

## 2022-03-19 DIAGNOSIS — K644 Residual hemorrhoidal skin tags: Secondary | ICD-10-CM

## 2022-03-19 DIAGNOSIS — R197 Diarrhea, unspecified: Secondary | ICD-10-CM

## 2022-03-19 DIAGNOSIS — R194 Change in bowel habit: Secondary | ICD-10-CM | POA: Diagnosis not present

## 2022-03-19 DIAGNOSIS — K648 Other hemorrhoids: Secondary | ICD-10-CM | POA: Diagnosis not present

## 2022-03-19 MED ORDER — SODIUM CHLORIDE 0.9 % IV SOLN: 500.0000 mL | Freq: Once | INTRAVENOUS | Status: DC

## 2022-03-19 NOTE — Op Note (Signed)
[facility] Patient Name: Amanda Terrell Procedure Date: 03/19/2022 8:30 AM MRN: [id number] Endoscopist: Remo Lipps P. Havery Moros , MD Age: 66 Referring MD:  Date of Birth: March 26, 1956 Gender: Female Account #: [id number] Procedure:                Colonoscopy Indications:              Altered bowel habits - intermittent loose urgent                            stools with fecal incontinence, daily fiber                            supplement has not helped, using immodium PRN Medicines:                Monitored Anesthesia Care Procedure:                Pre-Anesthesia Assessment:                           - Prior to the procedure, a History and Physical                            was performed, and patient medications and                            allergies were reviewed. The patient's tolerance of                            previous anesthesia was also reviewed. The risks                            and benefits of the procedure and the sedation                            options and risks were discussed with the patient.                            All questions were answered, and informed consent                            was obtained. Prior Anticoagulants: The patient has                            taken no previous anticoagulant or antiplatelet                            agents. ASA Grade Assessment: II - A patient with                            mild systemic disease. After reviewing the risks                            and benefits, the patient was deemed in  satisfactory condition to undergo the procedure.                           After obtaining informed consent, the colonoscope                            was passed under direct vision. Throughout the                            procedure, the patient's blood pressure, pulse, and                            oxygen saturations were monitored continuously. The                            PCF-HQ190L  Colonoscope was introduced through the                            anus and advanced to the the cecum, identified by                            appendiceal orifice and ileocecal valve. The                            colonoscopy was performed without difficulty. The                            patient tolerated the procedure well. The quality                            of the bowel preparation was adequate. The                            ileocecal valve, appendiceal orifice, and rectum                            were photographed. Scope In: 8:47:05 AM Scope Out: 9:15:47 AM Scope Withdrawal Time: 0 hours 20 minutes 41 seconds  Total Procedure Duration: 0 hours 28 minutes 42 seconds  Findings:                 Skin tags were found on perianal exam.                           The colon was rather tortuous with restricted                            mobility of the colon.                           Multiple medium-mouthed diverticula were found in                            the entire colon.  A 3 mm polyp was found in the cecum. The polyp was                            flat. The polyp was removed with a cold snare.                            Resection and retrieval were complete.                           A 3 to 4 mm polyp was found in the transverse                            colon. The polyp was sessile. The polyp was removed                            with a cold snare. Resection and retrieval were                            complete.                           A diminutive polyp was found in the recto-sigmoid                            colon. The polyp was sessile. The polyp was removed                            with a cold snare. Resection and retrieval were                            complete.                           Internal hemorrhoids were found during retroflexion.                           There was poor air retention in the left colon                             which prolonged withdrawal. The exam was otherwise                            without abnormality.                           Biopsies for histology were taken with a cold                            forceps from the right colon, left colon and                            transverse colon for evaluation of microscopic  colitis. Complications:            No immediate complications. Estimated blood loss:                            Minimal. Estimated Blood Loss:     Estimated blood loss was minimal. Impression:               - Perianal skin tags found on perianal exam.                           - Tortuous colon.                           - Diverticulosis in the entire examined colon.                           - One 3 mm polyp in the cecum, removed with a cold                            snare. Resected and retrieved.                           - One 3 to 4 mm polyp in the transverse colon,                            removed with a cold snare. Resected and retrieved.                           - One diminutive polyp at the recto-sigmoid colon,                            removed with a cold snare. Resected and retrieved.                           - Internal hemorrhoids.                           - Poor air retention in the left colon.                           - The examination was otherwise normal.                           - Biopsies were taken with a cold forceps from the                            right colon, left colon and transverse colon for                            evaluation of microscopic colitis. Recommendation:           - Patient has a contact number available for                            emergencies. The signs  and symptoms of potential                            delayed complications were discussed with the                            patient. Return to normal activities tomorrow.                            Written discharge instructions were provided to  the                            patient.                           - Resume previous diet.                           - Continue present medications.                           - Await pathology results with further                            recommendations Remo Lipps P. Havery Moros, MD 03/19/2022 9:22:44 AM This report has been signed electronically.

## 2022-03-19 NOTE — Progress Notes (Signed)
Pt's states no medical or surgical changes since previsit or office visit. 

## 2022-03-19 NOTE — Progress Notes (Signed)
[facility] History and Physical   Primary Care Physician:  [name], MD   Reason for Procedure:   Altered bowel habits , diarrhea  Plan:    colonoscopy     HPI: Amanda Terrell is a 66 y.o. female  here for colonoscopy to further evaluate bowel habits. Eratic bowel habits, urgent loose stools at times with incontinence which has persisted despite trial of a variety of regimens.. Otherwise feels well without any cardiopulmonary symptoms.   I have discussed risks / benefits of anesthesia and endoscopic procedure with Shelton Silvas and they wish to proceed with the exams as outlined today.    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   Arthritis    Axillary abscess    left   Barrett's esophagus    Blood transfusion without reported diagnosis    1989   Breast cancer of upper-outer quadrant of left female breast ([place]) 07/11/2014   Cataract    both eyes in 2016   Cellulitis and abscess of unspecified site    Diverticulosis    Esophageal stricture    GERD (gastroesophageal reflux disease)    Heart murmur    Hepatitis C 1990   interferon treatment   Hiatal hernia    History of breast cancer    Hyperlipidemia    IBS (irritable bowel syndrome)    Internal hemorrhoids    Mitral valve prolapse    Neuropathy    Osteoporosis    Other specified disease of hair and hair follicles    Personal history of radiation therapy 2016   PONV (postoperative nausea and vomiting)    Postmenopausal 02/24/2018   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/05/14-10/04/14   Left Breast DCIS   Reflex sympathetic dystrophy    Rosacea    Scoliosis    adhesive arachnoditis-right side    Past Surgical History:  Procedure Laterality Date   [place], 2006   due to scoliosis= total 5 back surgeries   BREAST FIBROADENOMA SURGERY     bilateral   BREAST LUMPECTOMY Left    BREAST LUMPECTOMY WITH RADIOACTIVE SEED LOCALIZATION Left 08/05/2014   Procedure:  LEFT PARTICAL MASTECTOMY WITH RADIOACTIVE SEED LOCALIZATION;  Surgeon: Fanny Skates, MD;  Location: [place];  Service: General;  Laterality: Left;   cataracts Bilateral    [place]   COLONOSCOPY  2009   COLONOSCOPY WITH PROPOFOL  2018   Dr.Macey Wurtz   HYSTEROSCOPY     INCISE AND DRAIN ABCESS     left axillary abscess   OSTECTOMY Right 09/08/2019   Procedure: OSTECTOMY OF DISTAL FIBULA RIGHT ANKLE AND DEBRIDEMENT OF ULCERATION RIGHT ANKLE;  Surgeon: Caprice Beaver, DPM;  Location: [facility];  Service: Podiatry;  Laterality: Right;   SPINAL FUSION     spinal surgeries     TONSILLECTOMY     UPPER GASTROINTESTINAL ENDOSCOPY      Prior to Admission medications   Medication Sig Start Date End Date Taking? Authorizing Provider  acetaminophen (TYLENOL) 500 MG tablet Take 500 mg by mouth every 6 (six) hours as needed.   Yes [provider]  Ascorbic Acid (VITA-C PO) Take 1 tablet by mouth daily.    Yes [provider]  fluticasone (FLONASE) 50 MCG/ACT nasal spray Place 2 sprays into both nostrils daily. 02/25/22  Yes Noemi Chapel A, NP  gabapentin (NEURONTIN) 300 MG capsule Take 300 mg by mouth 4 (four) times daily  as needed (pain).    Yes [provider]  Multiple Vitamin (MULTIVITAMIN) tablet Take 1 tablet by mouth daily.    Yes [provider]  omeprazole (PRILOSEC) 20 MG capsule Take 1 capsule (20 mg total) by mouth 2 (two) times daily before a meal. 11/27/21  Yes Daundre Biel, Carlota Raspberry, MD  Calcium Carb-Cholecalciferol 1000-800 MG-UNIT TABS Take 1 tablet by mouth daily.     [provider]  denosumab (PROLIA) 60 MG/ML SOSY injection Inject 60 mg into the skin every 6 (six) months.    [provider]  ibuprofen (ADVIL) 200 MG tablet Take 200 mg by mouth every 6 (six) hours as needed.    [provider]  loperamide (IMODIUM A-D) 2 MG tablet Take 1 tablet (2 mg total) by mouth as  needed for diarrhea or loose stools. Patient not taking: Reported on 02/14/2022 11/20/21   Yetta Flock, MD  Methylcellulose, Laxative, (CITRUCEL PO) Take by mouth.    [provider]    Current Outpatient Medications  Medication Sig Dispense Refill   acetaminophen (TYLENOL) 500 MG tablet Take 500 mg by mouth every 6 (six) hours as needed.     Ascorbic Acid (VITA-C PO) Take 1 tablet by mouth daily.      fluticasone (FLONASE) 50 MCG/ACT nasal spray Place 2 sprays into both nostrils daily. 16 g 0   gabapentin (NEURONTIN) 300 MG capsule Take 300 mg by mouth 4 (four) times daily as needed (pain).      Multiple Vitamin (MULTIVITAMIN) tablet Take 1 tablet by mouth daily.      omeprazole (PRILOSEC) 20 MG capsule Take 1 capsule (20 mg total) by mouth 2 (two) times daily before a meal. 60 capsule 5   Calcium Carb-Cholecalciferol 1000-800 MG-UNIT TABS Take 1 tablet by mouth daily.      denosumab (PROLIA) 60 MG/ML SOSY injection Inject 60 mg into the skin every 6 (six) months.     ibuprofen (ADVIL) 200 MG tablet Take 200 mg by mouth every 6 (six) hours as needed.     loperamide (IMODIUM A-D) 2 MG tablet Take 1 tablet (2 mg total) by mouth as needed for diarrhea or loose stools. (Patient not taking: Reported on 02/14/2022) 30 tablet 0   Methylcellulose, Laxative, (CITRUCEL PO) Take by mouth.     Current Facility-Administered Medications  Medication Dose Route Frequency Provider Last Rate Last Admin   0.9 %  sodium chloride infusion  500 mL Intravenous Once Sammantha Mehlhaff, Carlota Raspberry, MD        Allergies as of 03/19/2022 - Review Complete 03/19/2022  Allergen Reaction Noted   Acetaminophen Other (See Comments) 08/18/2014    Family History  Problem Relation Age of Onset   Heart disease Mother    Brain cancer Father        Glioblastoma   Melanoma Paternal Grandmother    Breast cancer Maternal Aunt        in her 91s   Lung cancer Maternal Aunt    Colon cancer Cousin    Stomach cancer  Neg Hx    Rectal cancer Neg Hx    Esophageal cancer Neg Hx     Social History   Socioeconomic History   Marital status: Married    Spouse name: Not on file   Number of children: Not on file   Years of education: Not on file   Highest education level: Not on file  Occupational History   Not on file  Tobacco Use  Smoking status: Never   Smokeless tobacco: Never  Vaping Use   Vaping Use: Never used  Substance and Sexual Activity   Alcohol use: Yes    Comment: 1 glass wine a month at most   Drug use: No   Sexual activity: Not Currently    Comment: 1st intercourse- 16, partners- 1, married- 31 yrs   Other Topics Concern   Not on file  Social History Narrative   Not on file   Social Determinants of Health   Financial Resource Strain: Not on file  Food Insecurity: Not on file  Transportation Needs: Not on file  Physical Activity: Not on file  Stress: Not on file  Social Connections: Not on file  Intimate Partner Violence: Not on file    Review of Systems: All other review of systems negative except as mentioned in the HPI.  Physical Exam: Vital signs BP (!) 140/82   Pulse 83   Temp (!) 96.9 F (36.1 C)   Resp 13   Ht '5\' 5"'$  (1.651 m)   Wt 147 lb (66.7 kg)   SpO2 97%   BMI 24.46 kg/m   General:   Alert,  Well-developed, pleasant and cooperative in NAD Lungs:  Clear throughout to auscultation.   Heart:  Regular rate and rhythm Abdomen:  Soft, nontender and nondistended.   Neuro/Psych:  Alert and cooperative. Normal mood and affect. A and O x 3  Jolly Mango, MD [facility]

## 2022-03-19 NOTE — Progress Notes (Signed)
Called to room to assist during endoscopic procedure.  Patient ID and intended procedure confirmed with present staff. Received instructions for my participation in the procedure from the performing physician.  

## 2022-03-19 NOTE — Patient Instructions (Signed)
Thank you for coming in to see [place] today! Resume previous diet and medications today. Return to normal daily activities tomorrow Biopsy results will be available in 7-10 days and recommendations will be made at that time if needed.   YOU HAD AN ENDOSCOPIC PROCEDURE TODAY AT [facility]:   Refer to the procedure report that was given to you for any specific questions about what was found during the examination.  If the procedure report does not answer your questions, please call your gastroenterologist to clarify.  If you requested that your care partner not be given the details of your procedure findings, then the procedure report has been included in a sealed envelope for you to review at your convenience later.  YOU SHOULD EXPECT: Some feelings of bloating in the abdomen. Passage of more gas than usual.  Walking can help get rid of the air that was put into your GI tract during the procedure and reduce the bloating. If you had a lower endoscopy (such as a colonoscopy or flexible sigmoidoscopy) you may notice spotting of blood in your stool or on the toilet paper. If you underwent a bowel prep for your procedure, you may not have a normal bowel movement for a few days.  Please Note:  You might notice some irritation and congestion in your nose or some drainage.  This is from the oxygen used during your procedure.  There is no need for concern and it should clear up in a day or so.  SYMPTOMS TO REPORT IMMEDIATELY:  Following lower endoscopy (colonoscopy or flexible sigmoidoscopy):  Excessive amounts of blood in the stool  Significant tenderness or worsening of abdominal pains  Swelling of the abdomen that is new, acute  Fever of 100F or higher   For urgent or emergent issues, a gastroenterologist can be reached at any hour by calling [phone]. Do not use MyChart messaging for urgent concerns.    DIET:  We do recommend a small meal at first, but then you may proceed to  your regular diet.  Drink plenty of fluids but you should avoid alcoholic beverages for 24 hours.  ACTIVITY:  You should plan to take it easy for the rest of today and you should NOT DRIVE or use heavy machinery until tomorrow (because of the sedation medicines used during the test).    FOLLOW UP: Our staff will call the number listed on your records the next business day following your procedure.  We will call around 7:15- 8:00 am to check on you and address any questions or concerns that you may have regarding the information given to you following your procedure. If we do not reach you, we will leave a message.  If you develop any symptoms (ie: fever, flu-like symptoms, shortness of breath, cough etc.) before then, please call [phone].  If you test positive for Covid 19 in the 2 weeks post procedure, please call and report this information to [place].    If any biopsies were taken you will be contacted by phone or by letter within the next 1-3 weeks.  Please call us at [phone] if you have not heard about the biopsies in 3 weeks.    SIGNATURES/CONFIDENTIALITY: You and/or your care partner have signed paperwork which will be entered into your electronic medical record.  These signatures attest to the fact that that the information above on your After Visit Summary has been reviewed and is understood.  Full responsibility of the confidentiality of this  discharge information lies with you and/or your care-partner.  

## 2022-03-19 NOTE — Progress Notes (Signed)
Report given to PACU, vss 

## 2022-03-20 ENCOUNTER — Telehealth: Payer: Self-pay | Admitting: *Deleted

## 2022-03-20 NOTE — Telephone Encounter (Signed)
  Follow up Call-     03/19/2022    8:11 AM 05/01/2021   10:41 AM  Call back number  Post procedure Call Back phone  # [phone]  Permission to leave phone message Yes Yes     Patient questions:  Do you have a fever, pain , or abdominal swelling? No. Pain Score  0 *  Have you tolerated food without any problems? Yes.    Have you been able to return to your normal activities? Yes.    Do you have any questions about your discharge instructions: Diet   No. Medications  No. Follow up visit  No.  Do you have questions or concerns about your Care? No.  Actions: * If pain score is 4 or above: No action needed, pain <4.

## 2022-03-28 DIAGNOSIS — M21371 Foot drop, right foot: Secondary | ICD-10-CM | POA: Diagnosis not present

## 2022-03-28 DIAGNOSIS — L97312 Non-pressure chronic ulcer of right ankle with fat layer exposed: Secondary | ICD-10-CM | POA: Diagnosis not present

## 2022-04-02 DIAGNOSIS — H905 Unspecified sensorineural hearing loss: Secondary | ICD-10-CM | POA: Diagnosis not present

## 2022-04-08 ENCOUNTER — Ambulatory Visit (HOSPITAL_COMMUNITY): Payer: Medicare Other | Admitting: Hematology

## 2022-04-08 ENCOUNTER — Ambulatory Visit (HOSPITAL_COMMUNITY): Payer: Medicare Other

## 2022-04-08 ENCOUNTER — Other Ambulatory Visit (HOSPITAL_COMMUNITY): Payer: Medicare Other

## 2022-04-23 ENCOUNTER — Ambulatory Visit: Payer: Medicare Other | Admitting: Hematology

## 2022-04-23 ENCOUNTER — Other Ambulatory Visit: Payer: Medicare Other

## 2022-04-23 ENCOUNTER — Ambulatory Visit: Payer: Medicare Other

## 2022-05-02 DIAGNOSIS — M21371 Foot drop, right foot: Secondary | ICD-10-CM | POA: Diagnosis not present

## 2022-05-02 DIAGNOSIS — L97312 Non-pressure chronic ulcer of right ankle with fat layer exposed: Secondary | ICD-10-CM | POA: Diagnosis not present

## 2022-05-08 ENCOUNTER — Ambulatory Visit
Admission: EM | Admit: 2022-05-08 | Discharge: 2022-05-08 | Disposition: A | Discharge status: Home / Self Care | Payer: Medicare Other | Attending: Family Medicine | Admitting: Family Medicine

## 2022-05-08 ENCOUNTER — Other Ambulatory Visit: Payer: Self-pay

## 2022-05-08 DIAGNOSIS — R42 Dizziness and giddiness: Secondary | ICD-10-CM | POA: Diagnosis not present

## 2022-05-08 DIAGNOSIS — R8290 Unspecified abnormal findings in urine: Secondary | ICD-10-CM | POA: Diagnosis not present

## 2022-05-08 DIAGNOSIS — R55 Syncope and collapse: Secondary | ICD-10-CM

## 2022-05-08 LAB — POCT URINALYSIS DIP (MANUAL ENTRY)
Bilirubin, UA: NEGATIVE
Glucose, UA: NEGATIVE mg/dL
Ketones, POC UA: NEGATIVE mg/dL
Nitrite, UA: NEGATIVE
Protein Ur, POC: NEGATIVE mg/dL
Spec Grav, UA: >=1.03 — AB (ref 1.010–1.025)
Urobilinogen, UA: 0.2 E.U./dL
pH, UA: 5.5 (ref 5.0–8.0)

## 2022-05-08 LAB — POCT FASTING CBG KUC MANUAL ENTRY: POCT Glucose (KUC): 97 mg/dL (ref 70–99)

## 2022-05-08 MED ORDER — CEPHALEXIN 500 MG PO CAPS: 500.0000 mg | ORAL_CAPSULE | Freq: Two times a day (BID) | ORAL | 0 refills | Status: DC

## 2022-05-08 NOTE — ED Provider Notes (Addendum)
[facility]    CSN: [id number] Arrival date & time: 05/08/22  1458      History   Chief Complaint No chief complaint on file.   HPI Amanda Terrell is a 66 y.o. female.   Patient presenting today for evaluation of a dizzy spell that occurred about an hour prior to arrival.  States she was home alone and became dizzy with a room spinning sensation, diaphoretic and nauseated.  Her entire body felt weak so she made her way to a chair and sat there for about 20 to 30 minutes until her symptoms dissipated.  She states her symptoms ultimately dissipated without intervention and she has had no subsequent symptoms since.  She denies any chest pain, shortness of breath, palpitations, extremity weakness numbness or tingling beyond baseline during incident and has not had any mental status changes throughout the day.  She has not taken anything for symptoms.  States it was a normal day prior to incident, ate breakfast and drink her coffee and had just showered prior to incident.  Denies any history of similar incidents.     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   Arthritis    Axillary abscess    left   Barrett's esophagus    Blood transfusion without reported diagnosis    1989   Breast cancer of upper-outer quadrant of left female breast ([place]) 07/11/2014   Cataract    both eyes in 2016   Cellulitis and abscess of unspecified site    Diverticulosis    Esophageal stricture    GERD (gastroesophageal reflux disease)    Heart murmur    Hepatitis C 1990   interferon treatment   Hiatal hernia    History of breast cancer    Hyperlipidemia    IBS (irritable bowel syndrome)    Internal hemorrhoids    Mitral valve prolapse    Neuropathy    Osteoporosis    Other specified disease of hair and hair follicles    Personal history of radiation therapy 2016   PONV (postoperative nausea and vomiting)    Postmenopausal 02/24/2018    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/05/14-10/04/14   Left Breast DCIS   Reflex sympathetic dystrophy    Rosacea    Scoliosis    adhesive arachnoditis-right side    Patient Active Problem List   Diagnosis Date Noted   NSAID long-term use 04/24/2021   Abdominal pain, epigastric 04/24/2021   Constipation 04/24/2021   Generalized abdominal pain 04/24/2021   Postmenopausal 02/24/2018   Pain syndrome, chronic 08/06/2015   Lower extremity pain, left 04/12/2015   Osteoporosis 11/21/2014   Breast cancer of upper-outer quadrant of left female breast ([place]) 07/11/2014   Abscess of axilla, left 06/27/2011   HYPERLIPIDEMIA 01/26/2008   REFLEX SYMPATHETIC DYSTROPHY 01/26/2008   NEUROPATHY 01/26/2008   HEMORRHOIDS, INTERNAL 01/26/2008   ESOPHAGEAL STRICTURE 01/26/2008   GERD 01/26/2008   BARRETTS ESOPHAGUS 01/26/2008   DIVERTICULOSIS, COLON 01/26/2008   IRRITABLE BOWEL SYNDROME 01/26/2008   SCOLIOSIS 01/26/2008   HEPATITIS C, HX OF 01/26/2008   MITRAL VALVE PROLAPSE, HX OF 01/26/2008    Past Surgical History:  Procedure Laterality Date   [place], 2006   due to scoliosis= total 5 back surgeries   BREAST FIBROADENOMA SURGERY     bilateral   BREAST LUMPECTOMY Left    BREAST LUMPECTOMY WITH RADIOACTIVE SEED LOCALIZATION Left 08/05/2014   Procedure: LEFT PARTICAL MASTECTOMY WITH RADIOACTIVE SEED  LOCALIZATION;  Surgeon: Fanny Skates, MD;  Location: [facility];  Service: General;  Laterality: Left;   cataracts Bilateral    [place]   COLONOSCOPY  2009   COLONOSCOPY WITH PROPOFOL  2018   Dr.Armbruster   HYSTEROSCOPY     INCISE AND DRAIN ABCESS     left axillary abscess   OSTECTOMY Right 09/08/2019   Procedure: OSTECTOMY OF DISTAL FIBULA RIGHT ANKLE AND DEBRIDEMENT OF ULCERATION RIGHT ANKLE;  Surgeon: Caprice Beaver, DPM;  Location: [facility];  Service: Podiatry;  Laterality: Right;   SPINAL FUSION     spinal surgeries     TONSILLECTOMY     UPPER  GASTROINTESTINAL ENDOSCOPY      OB History     Gravida  4   Para  3   Term      Preterm      AB      Living  3      SAB      IAB      Ectopic      Multiple      Live Births               Home Medications    Prior to Admission medications   Medication Sig Start Date End Date Taking? Authorizing Provider  cephALEXin (KEFLEX) 500 MG capsule Take 1 capsule (500 mg total) by mouth 2 (two) times daily. 05/08/22  Yes Volney American, PA-C  acetaminophen (TYLENOL) 500 MG tablet Take 500 mg by mouth every 6 (six) hours as needed.    [provider]  Ascorbic Acid (VITA-C PO) Take 1 tablet by mouth daily.     [provider]  Calcium Carb-Cholecalciferol 1000-800 MG-UNIT TABS Take 1 tablet by mouth daily.     [provider]  denosumab (PROLIA) 60 MG/ML SOSY injection Inject 60 mg into the skin every 6 (six) months.    [provider]  fluticasone (FLONASE) 50 MCG/ACT nasal spray Place 2 sprays into both nostrils daily. 02/25/22   Eulogio Bear, NP  gabapentin (NEURONTIN) 300 MG capsule Take 300 mg by mouth 4 (four) times daily as needed (pain).     [provider]  ibuprofen (ADVIL) 200 MG tablet Take 200 mg by mouth every 6 (six) hours as needed.    [provider]  loperamide (IMODIUM A-D) 2 MG tablet Take 1 tablet (2 mg total) by mouth as needed for diarrhea or loose stools. Patient not taking: Reported on 02/14/2022 11/20/21   Yetta Flock, MD  Methylcellulose, Laxative, (CITRUCEL PO) Take by mouth.    [provider]  Multiple Vitamin (MULTIVITAMIN) tablet Take 1 tablet by mouth daily.     [provider]  omeprazole (PRILOSEC) 20 MG capsule Take 1 capsule (20 mg total) by mouth 2 (two) times daily before a meal. 11/27/21   Armbruster, Carlota Raspberry, MD    Family History Family History  Problem Relation Age of Onset   Heart disease Mother    Brain cancer Father        Glioblastoma    Melanoma Paternal Grandmother    Breast cancer Maternal Aunt        in her 20s   Lung cancer Maternal Aunt    Colon cancer Cousin    Stomach cancer Neg Hx    Rectal cancer Neg Hx    Esophageal cancer Neg Hx     Social History Social History  Tobacco Use   Smoking status: Never   Smokeless tobacco: Never  Vaping Use   Vaping Use: Never used  Substance Use Topics   Alcohol use: Yes    Comment: 1 glass wine a month at most   Drug use: No     Allergies   Acetaminophen   Review of Systems Review of Systems Per HPI  Physical Exam Triage Vital Signs ED Triage Vitals  Enc Vitals Group     BP 05/08/22 1519 113/72     Pulse Rate 05/08/22 1519 72     Resp 05/08/22 1519 20     Temp 05/08/22 1519 97.7 F (36.5 C)     Temp Source 05/08/22 1519 Oral     SpO2 05/08/22 1519 96 %     Weight --      Height --      Head Circumference --      Peak Flow --      Pain Score 05/08/22 1521 0     Pain Loc --      Pain Edu? --      Excl. in [place]? --    Orthostatic VS for the past 24 hrs:  BP- Lying Pulse- Lying BP- Sitting Pulse- Sitting BP- Standing at 0 minutes Pulse- Standing at 0 minutes  05/08/22 1610 123/78 72 117/73 72 122/75 76    Updated Vital Signs BP 113/72 (BP Location: Right Arm)   Pulse 72   Temp 97.7 F (36.5 C) (Oral)   Resp 20   SpO2 96%   Visual Acuity Right Eye Distance:   Left Eye Distance:   Bilateral Distance:    Right Eye Near:   Left Eye Near:    Bilateral Near:     Physical Exam Vitals and nursing note reviewed.  Constitutional:      Appearance: Normal appearance. She is not ill-appearing.  HENT:     Head: Atraumatic.     Mouth/Throat:     Mouth: Mucous membranes are moist.  Eyes:     Extraocular Movements: Extraocular movements intact.     Conjunctiva/sclera: Conjunctivae normal.  Cardiovascular:     Rate and Rhythm: Normal rate and regular rhythm.     Heart sounds: Normal heart sounds.  Pulmonary:     Effort: Pulmonary effort  is normal.     Breath sounds: Normal breath sounds. No wheezing or rales.  Musculoskeletal:     Cervical back: Normal range of motion and neck supple.     Comments: Range of motion is at her baseline, ambulates with the use of 2 canes which she states is typical for her.  Skin:    General: Skin is warm and dry.  Neurological:     General: No focal deficit present.     Mental Status: She is alert and oriented to person, place, and time.     Cranial Nerves: No cranial nerve deficit.     Motor: No weakness.     Gait: Gait normal.  Psychiatric:        Mood and Affect: Mood normal.        Thought Content: Thought content normal.        Judgment: Judgment normal.      UC Treatments / Results  Labs (all labs ordered are listed, but only abnormal results are displayed) Labs Reviewed  POCT URINALYSIS DIP (MANUAL ENTRY) - Abnormal; Notable for the following components:      Result Value   Spec Grav, UA >=1.030 (*)  Blood, UA trace-intact (*)    Leukocytes, UA Moderate (2+) (*)    All other components within normal limits  URINE CULTURE  COMPREHENSIVE METABOLIC PANEL  CBC WITH DIFFERENTIAL/PLATELET  POCT FASTING CBG [facility] MANUAL ENTRY    EKG   Radiology No results found.  Procedures Procedures (including critical care time)  Medications Ordered in UC Medications - No data to display  Initial Impression / Assessment and Plan / UC Course  I have reviewed the triage vital signs and the nursing notes.  Pertinent labs & imaging results that were available during my care of the patient were reviewed by me and considered in my medical decision making (see chart for details).     Vital signs and exam benign and very reassuring today with no focal abnormalities, EKG today showing normal sinus rhythm with no acute ST or T wave changes, urinalysis does show moderate leuks and trace blood so we will send for urine culture and start empiric antibiotics in case urinary tract infection.   She does endorse after discussing this result some urgency and frequency recently.  CBG within normal limits, labs pending.  She has been asymptomatic since the incident at home.  Discussed ED follow-up if recurring or worsening symptoms.  Follow-up with your PCP for recheck additionally.  15-minute spent today in direct patient care, evaluation and education Final Clinical Impressions(s) / UC Diagnoses   Final diagnoses:  Abnormal urinalysis  Dizziness  Pre-syncope   Discharge Instructions   None    ED Prescriptions     Medication Sig Dispense Auth. Provider   cephALEXin (KEFLEX) 500 MG capsule Take 1 capsule (500 mg total) by mouth 2 (two) times daily. 10 capsule Volney American, [place]      PDMP not reviewed this encounter.   Volney American, [place] 05/08/22 [place], [place], [place] 05/08/22 [phone]

## 2022-05-08 NOTE — ED Triage Notes (Signed)
About 1hr half ago got dizzy couldn't really stand up felt nasueas  Broke out in a sweat and weak  Pt took no meds Reports feels ok now

## 2022-05-09 LAB — CBC WITH DIFFERENTIAL/PLATELET
Basophils Absolute: 0.1 10*3/uL (ref 0.0–0.2)
Basos: 1 %
EOS (ABSOLUTE): 0 10*3/uL (ref 0.0–0.4)
Eos: 0 %
Hematocrit: 44.2 % (ref 34.0–46.6)
Hemoglobin: 14.7 g/dL (ref 11.1–15.9)
Immature Grans (Abs): 0 10*3/uL (ref 0.0–0.1)
Immature Granulocytes: 0 %
Lymphocytes Absolute: 0.9 10*3/uL (ref 0.7–3.1)
Lymphs: 7 %
MCH: 28.6 pg (ref 26.6–33.0)
MCHC: 33.3 g/dL (ref 31.5–35.7)
MCV: 86 fL (ref 79–97)
Monocytes Absolute: 0.5 10*3/uL (ref 0.1–0.9)
Monocytes: 4 %
Neutrophils Absolute: 10.8 10*3/uL — ABNORMAL HIGH (ref 1.4–7.0)
Neutrophils: 88 %
Platelets: 260 10*3/uL (ref 150–450)
RBC: 5.14 x10E6/uL (ref 3.77–5.28)
RDW: 13.2 % (ref 11.7–15.4)
WBC: 12.3 10*3/uL — ABNORMAL HIGH (ref 3.4–10.8)

## 2022-05-09 LAB — COMPREHENSIVE METABOLIC PANEL
ALT: 20 IU/L (ref 0–32)
AST: 24 IU/L (ref 0–40)
Albumin/Globulin Ratio: 2.3 — ABNORMAL HIGH (ref 1.2–2.2)
Albumin: 4.8 g/dL (ref 3.9–4.9)
Alkaline Phosphatase: 72 IU/L (ref 44–121)
BUN/Creatinine Ratio: 28 (ref 12–28)
BUN: 16 mg/dL (ref 8–27)
Bilirubin Total: 0.3 mg/dL (ref 0.0–1.2)
CO2: 27 mmol/L (ref 20–29)
Calcium: 10.3 mg/dL (ref 8.7–10.3)
Chloride: 100 mmol/L (ref 96–106)
Creatinine, Ser: 0.58 mg/dL (ref 0.57–1.00)
Globulin, Total: 2.1 g/dL (ref 1.5–4.5)
Glucose: 112 mg/dL — ABNORMAL HIGH (ref 70–99)
Potassium: 4.5 mmol/L (ref 3.5–5.2)
Sodium: 142 mmol/L (ref 134–144)
Total Protein: 6.9 g/dL (ref 6.0–8.5)
eGFR: 99 mL/min/{1.73_m2} (ref >59)

## 2022-05-10 LAB — URINE CULTURE: Culture: 70000 — AB

## 2022-05-14 DIAGNOSIS — H8102 Meniere's disease, left ear: Secondary | ICD-10-CM | POA: Diagnosis not present

## 2022-05-14 DIAGNOSIS — R42 Dizziness and giddiness: Secondary | ICD-10-CM | POA: Diagnosis not present

## 2022-05-14 DIAGNOSIS — H9042 Sensorineural hearing loss, unilateral, left ear, with unrestricted hearing on the contralateral side: Secondary | ICD-10-CM | POA: Diagnosis not present

## 2022-05-21 ENCOUNTER — Other Ambulatory Visit (HOSPITAL_COMMUNITY): Payer: Self-pay | Admitting: Otolaryngology

## 2022-05-21 DIAGNOSIS — R42 Dizziness and giddiness: Secondary | ICD-10-CM

## 2022-05-21 DIAGNOSIS — H918X3 Other specified hearing loss, bilateral: Secondary | ICD-10-CM

## 2022-05-28 ENCOUNTER — Other Ambulatory Visit: Payer: Self-pay

## 2022-05-28 DIAGNOSIS — R42 Dizziness and giddiness: Secondary | ICD-10-CM | POA: Diagnosis not present

## 2022-05-28 DIAGNOSIS — Z170 Estrogen receptor positive status [ER+]: Secondary | ICD-10-CM

## 2022-05-28 DIAGNOSIS — M810 Age-related osteoporosis without current pathological fracture: Secondary | ICD-10-CM

## 2022-05-28 NOTE — Progress Notes (Signed)
CMP placed per protocol

## 2022-05-29 ENCOUNTER — Inpatient Hospital Stay: Payer: Medicare Other

## 2022-05-29 ENCOUNTER — Inpatient Hospital Stay: Payer: Medicare Other | Attending: Hematology | Admitting: Hematology

## 2022-05-29 VITALS — BP 132/70 | HR 67 | Temp 98.0°F | Resp 18 | Ht 65.0 in | Wt 146.4 lb

## 2022-05-29 DIAGNOSIS — M791 Myalgia, unspecified site: Secondary | ICD-10-CM | POA: Insufficient documentation

## 2022-05-29 DIAGNOSIS — C50412 Malignant neoplasm of upper-outer quadrant of left female breast: Secondary | ICD-10-CM | POA: Insufficient documentation

## 2022-05-29 DIAGNOSIS — M810 Age-related osteoporosis without current pathological fracture: Secondary | ICD-10-CM | POA: Diagnosis not present

## 2022-05-29 DIAGNOSIS — M2550 Pain in unspecified joint: Secondary | ICD-10-CM | POA: Insufficient documentation

## 2022-05-29 DIAGNOSIS — Z23 Encounter for immunization: Secondary | ICD-10-CM | POA: Diagnosis not present

## 2022-05-29 DIAGNOSIS — Z1231 Encounter for screening mammogram for malignant neoplasm of breast: Secondary | ICD-10-CM | POA: Diagnosis not present

## 2022-05-29 DIAGNOSIS — Z170 Estrogen receptor positive status [ER+]: Secondary | ICD-10-CM | POA: Insufficient documentation

## 2022-05-29 DIAGNOSIS — Z79899 Other long term (current) drug therapy: Secondary | ICD-10-CM | POA: Diagnosis not present

## 2022-05-29 DIAGNOSIS — Z780 Asymptomatic menopausal state: Secondary | ICD-10-CM

## 2022-05-29 LAB — COMPREHENSIVE METABOLIC PANEL
ALT: 22 U/L (ref 0–44)
AST: 23 U/L (ref 15–41)
Albumin: 4.2 g/dL (ref 3.5–5.0)
Alkaline Phosphatase: 60 U/L (ref 38–126)
Anion gap: 7 (ref 5–15)
BUN: 17 mg/dL (ref 8–23)
CO2: 31 mmol/L (ref 22–32)
Calcium: 9.6 mg/dL (ref 8.9–10.3)
Chloride: 101 mmol/L (ref 98–111)
Creatinine, Ser: 0.59 mg/dL (ref 0.44–1.00)
GFR, Estimated: >60 mL/min (ref >60)
Glucose, Bld: 99 mg/dL (ref 70–99)
Potassium: 4.4 mmol/L (ref 3.5–5.1)
Sodium: 139 mmol/L (ref 135–145)
Total Bilirubin: 0.8 mg/dL (ref 0.3–1.2)
Total Protein: 7 g/dL (ref 6.5–8.1)

## 2022-05-29 MED ORDER — DENOSUMAB 60 MG/ML ~~LOC~~ SOSY
60.0000 mg | PREFILLED_SYRINGE | Freq: Once | SUBCUTANEOUS | Status: AC
  Administered 2022-05-29: 60 mg via SUBCUTANEOUS
  Filled 2022-05-29: qty 1

## 2022-05-29 MED ORDER — INFLUENZA VAC A&B SA ADJ QUAD 0.5 ML IM PRSY: 0.5000 mL | PREFILLED_SYRINGE | Freq: Once | INTRAMUSCULAR | Status: DC

## 2022-05-29 MED ORDER — INFLUENZA VAC A&B SA ADJ QUAD 0.5 ML IM PRSY
0.5000 mL | PREFILLED_SYRINGE | Freq: Once | INTRAMUSCULAR | Status: AC
  Administered 2022-05-29: 0.5 mL via INTRAMUSCULAR

## 2022-05-29 NOTE — Patient Instructions (Addendum)
[facility]  Discharge Instructions  You were seen and examined today by Dr. Delton Coombes.  Dr. Delton Coombes discussed your most recent lab work and mammogram which revealed that everything is normal.  Follow-up as scheduled in 1 year.    Thank you for choosing [facility] to provide your oncology and hematology care.   To afford each patient quality time with our provider, please arrive at least 15 minutes before your scheduled appointment time. You may need to reschedule your appointment if you arrive late (10 or more minutes). Arriving late affects you and other patients whose appointments are after yours.  Also, if you miss three or more appointments without notifying the office, you may be dismissed from the clinic at the provider's discretion.    Again, thank you for choosing [facility].  Our hope is that these requests will decrease the amount of time that you wait before being seen by our physicians.   If you have a lab appointment with the [facility] please come in thru the Main Entrance and check in at the main information desk.           _____________________________________________________________  Should you have questions after your visit to [facility], please contact our office at [phone] and follow the prompts.  Our office hours are 8:00 a.m. to 4:30 p.m. Monday - Thursday and 8:00 a.m. to 2:30 p.m. Friday.  Please note that voicemails left after 4:00 p.m. may not be returned until the following business day.  We are closed weekends and all major holidays.  You do have access to a nurse 24-7, just call the main number to the clinic [phone] and do not press any options, hold on the line and a nurse will answer the phone.    For prescription refill requests, have your pharmacy contact our office and allow 72 hours.    Masks are optional in the cancer centers. If you would like for  your care team to wear a mask while they are taking care of you, please let them know. You may have one support person who is at least 68 years old accompany you for your appointments.

## 2022-05-29 NOTE — Progress Notes (Unsigned)
[facility] [address] [place] [address]   Patient Care Team: [name], MD as PCP - General (Internal Medicine) Glenna Fellows, MD as Referring Physician (Neurosurgery) Fanny Skates, MD as Consulting Physician (General Surgery) Truitt Merle, MD as Consulting Physician (Hematology) Thea Silversmith, MD as Consulting Physician (Radiation Oncology) Holley Bouche, NP (Inactive) as Nurse Practitioner (Nurse Practitioner) Patrici Ranks, MD (Inactive) as Consulting Physician (Hematology and Oncology)  SUMMARY OF ONCOLOGIC HISTORY: Oncology History  Breast cancer of upper-outer quadrant of left female breast ([place])  07/05/2014 Breast US   Palpable left breast mass; U/S revealed irregular mass in UOQ of left breast with associated pleomorphic calcs. Mass and calcs span 1.5 cm.    07/07/2014 Initial Biopsy   Left breast needle core biopsy, 2 o'clock position revealed intermediate grade DCIS with calcs. ER+ (100%), PR+ (59%).    07/07/2014 Initial Diagnosis   Breast cancer of upper-outer quadrant of left female breast   07/12/2014 Breast MRI   UOQ of left breast with 2.0 x 1.4 x 1.3 cm biopsy-proven ductal carcinoma. No evidence of malignancy elsewhere in either breast. No adenopathy.    08/05/2014 Definitive Surgery   Left lumpectomy (Dr. Dalbert Batman) revealed intermediate grade DCIS, spanning 2.2 cm. Negative margins. 1 intramammary LN benign.    09/05/2014 - 10/04/2014 Radiation Therapy   Left breast: total radiation dose 42.72 Gy over 21 fractions.  Left breast boost: total radiation dose 10 Gy over 5 fractions.    09/22/2014 - 10/21/2014 Anti-estrogen oral therapy   Tamoxifen started by Dr. Whitney Muse.  Planned duration of treatment: 5 years.    10/16/2014 Adverse Reaction   Did not feel well. Worsening pain/joint pain   10/28/2014 Survivorship   Survivorship Care Plan given and reviewed with patient in-person.      CHIEF COMPLIANT: Follow-up for Breast cancer of  upper-outer quadrant of left female breast, ER+/PR+   INTERVAL HISTORY: Amanda Terrell is a 66 y.o. female seen for follow-up of left breast cancer.  She denies any new onset pains.  She is continuing to take calcium and vitamin D supplements.  Denies any recent infections.  REVIEW OF SYSTEMS:   Review of Systems  Constitutional:  Negative for fatigue (80%).  Gastrointestinal:  Positive for constipation and diarrhea.  Neurological:  Positive for headaches.  All other systems reviewed and are negative.   I have reviewed the past medical history, past surgical history, social history and family history with the patient and they are unchanged from previous note.   ALLERGIES:   has no active allergies.   MEDICATIONS:  Current Outpatient Medications  Medication Sig Dispense Refill   acetaminophen (TYLENOL) 500 MG tablet Take 500 mg by mouth every 6 (six) hours as needed.     Ascorbic Acid (VITA-C PO) Take 1 tablet by mouth daily.      Calcium Carb-Cholecalciferol 1000-800 MG-UNIT TABS Take 1 tablet by mouth daily.      denosumab (PROLIA) 60 MG/ML SOSY injection Inject 60 mg into the skin every 6 (six) months.     gabapentin (NEURONTIN) 300 MG capsule Take 300 mg by mouth 4 (four) times daily as needed (pain).      loperamide (IMODIUM A-D) 2 MG tablet Take 1 tablet (2 mg total) by mouth as needed for diarrhea or loose stools. (Patient not taking: Reported on 02/14/2022) 30 tablet 0   Multiple Vitamin (MULTIVITAMIN) tablet Take 1 tablet by mouth daily.      omeprazole (  PRILOSEC) 20 MG capsule Take 1 capsule (20 mg total) by mouth 2 (two) times daily before a meal. 60 capsule 5   Current Facility-Administered Medications  Medication Dose Route Frequency Provider Last Rate Last Admin   influenza vaccine adjuvanted (FLUAD) injection 0.5 mL  0.5 mL Intramuscular Once Derek Jack, MD         PHYSICAL EXAMINATION: Performance status (ECOG): 1 - Symptomatic but completely  ambulatory  Vitals:   05/29/22 1425  BP: 132/70  Pulse: 67  Resp: 18  Temp: 98 F (36.7 C)  SpO2: 98%   Wt Readings from Last 3 Encounters:  05/29/22 146 lb 6.4 oz (66.4 kg)  03/19/22 147 lb (66.7 kg)  02/14/22 147 lb (66.7 kg)   Physical Exam Vitals reviewed.  Constitutional:      Appearance: Normal appearance.  Cardiovascular:     Rate and Rhythm: Normal rate and regular rhythm.     Pulses: Normal pulses.     Heart sounds: Normal heart sounds.  Pulmonary:     Effort: Pulmonary effort is normal.     Breath sounds: Normal breath sounds.  Neurological:     General: No focal deficit present.     Mental Status: She is alert and oriented to person, place, and time.  Psychiatric:        Mood and Affect: Mood normal.        Behavior: Behavior normal.     Breast Exam Chaperone: Amanda Terrell     LABORATORY DATA:  I have reviewed the data as listed    Latest Ref Rng & Units 05/29/2022    1:23 PM 05/08/2022    4:54 PM 10/03/2021    1:00 PM  CMP  Glucose 70 - 99 mg/dL 99  112  82   BUN 8 - 23 mg/dL '17  16  19   '$ Creatinine 0.44 - 1.00 mg/dL 0.59  0.58  0.66   Sodium 135 - 145 mmol/L 139  142  138   Potassium 3.5 - 5.1 mmol/L 4.4  4.5  4.1   Chloride 98 - 111 mmol/L 101  100  99   CO2 22 - 32 mmol/L '31  27  31   '$ Calcium 8.9 - 10.3 mg/dL 9.6  10.3  9.7   Total Protein 6.5 - 8.1 g/dL 7.0  6.9  7.4   Total Bilirubin 0.3 - 1.2 mg/dL 0.8  0.3  0.4   Alkaline Phos 38 - 126 U/L 60  72  54   AST 15 - 41 U/L '23  24  22   '$ ALT 0 - 44 U/L '22  20  20    '$ No results found for: "CAN153" Lab Results  Component Value Date   WBC 12.3 (H) 05/08/2022   HGB 14.7 05/08/2022   HCT 44.2 05/08/2022   MCV 86 05/08/2022   PLT 260 05/08/2022   NEUTROABS 10.8 (H) 05/08/2022    ASSESSMENT:  1.  DCIS of the left breast: -Biopsy on 07/07/2014, DCIS, ER/PR positive - Left lumpectomy on 08/05/2014, 2.2 cm DCIS, intermediate grade, 0/1 lymph node positive, PTis PN0.  ER/PR positive. -  Underwent radiation therapy from 09/05/2014 through 10/04/2014. -Took tamoxifen for only 1 week and had developed arthralgias and myalgias and could not continue it.    2.  Osteoporosis: -DEXA scan on 10/03/2017 shows T score of -3.9. -She was started on Prolia on 03/10/2018.  PLAN:  1.  DCIS of the left breast: - Physical exam shows left breast upper  outer quadrant lumpectomy scar is within normal limits.  No palpable masses in bilateral breast.  No palpable adenopathy. - Mammogram on 09/26/2021 was BI-RADS Category 1. - Labs on 05/08/2022 shows normal CBC with mildly elevated white count.  LFTs are normal. - RTC 1 year for follow-up.   2.  Osteoporosis: - DEXA scan (01/05/2021): T score -3.2, improved. - Calcium is 9.6.  Continue Prolia today and every 6 months.  Breast Cancer therapy associated bone loss: I have recommended calcium, Vitamin D and weight bearing exercises.  Orders placed this encounter:  Orders Placed This Encounter  Procedures   MM 3D SCREEN BREAST BILATERAL   CBC with Differential/Platelet   Comprehensive metabolic panel   VITAMIN D 25 Hydroxy (Vit-D Deficiency, Fractures)     The patient has a good understanding of the overall plan. She agrees with it. She will call with any problems that may develop before the next visit here.  Derek Jack, MD [facility] [phone]

## 2022-05-29 NOTE — Patient Instructions (Signed)
[facility]  Discharge Instructions: Thank you for choosing [facility] to provide your oncology and hematology care.  If you have a lab appointment with the [facility], please come in thru the Main Entrance and check in at the main information desk.  Wear comfortable clothing and clothing appropriate for easy access to any Portacath or PICC line.   We strive to give you quality time with your provider. You may need to reschedule your appointment if you arrive late (15 or more minutes).  Arriving late affects you and other patients whose appointments are after yours.  Also, if you miss three or more appointments without notifying the office, you may be dismissed from the clinic at the provider's discretion.      For prescription refill requests, have your pharmacy contact our office and allow 72 hours for refills to be completed.    Today you received the following prolia and flu vaccination, return as scheduled.   To help prevent nausea and vomiting after your treatment, we encourage you to take your nausea medication as directed.  BELOW ARE SYMPTOMS THAT SHOULD BE REPORTED IMMEDIATELY: *FEVER GREATER THAN 100.4 F (38 C) OR HIGHER *CHILLS OR SWEATING *NAUSEA AND VOMITING THAT IS NOT CONTROLLED WITH YOUR NAUSEA MEDICATION *UNUSUAL SHORTNESS OF BREATH *UNUSUAL BRUISING OR BLEEDING *URINARY PROBLEMS (pain or burning when urinating, or frequent urination) *BOWEL PROBLEMS (unusual diarrhea, constipation, pain near the anus) TENDERNESS IN MOUTH AND THROAT WITH OR WITHOUT PRESENCE OF ULCERS (sore throat, sores in mouth, or a toothache) UNUSUAL RASH, SWELLING OR PAIN  UNUSUAL VAGINAL DISCHARGE OR ITCHING   Items with * indicate a potential emergency and should be followed up as soon as possible or go to the Emergency Department if any problems should occur.  Please show the CHEMOTHERAPY ALERT CARD or IMMUNOTHERAPY ALERT CARD at check-in to the Emergency  Department and triage nurse.  Should you have questions after your visit or need to cancel or reschedule your appointment, please contact [facility] [phone]  and follow the prompts.  Office hours are 8:00 a.m. to 4:30 p.m. Monday - Friday. Please note that voicemails left after 4:00 p.m. may not be returned until the following business day.  We are closed weekends and major holidays. You have access to a nurse at all times for urgent questions. Please call the main number to the clinic [phone] and follow the prompts.  For any non-urgent questions, you may also contact your provider using MyChart. We now offer e-Visits for anyone 63 and older to request care online for non-urgent symptoms. For details visit [email or web address].   Also download the MyChart app! Go to the app store, search "MyChart", open the app, select [facility], and log in with your MyChart username and password.  Masks are optional in the cancer centers. If you would like for your care team to wear a mask while they are taking care of you, please let them know. You may have one support person who is at least 68 years old accompany you for your appointments.

## 2022-05-29 NOTE — Progress Notes (Signed)
Patient and labs assessed by Dr. Delton Coombes, patient okay to receive Prolia injection. Patient taking calcium as directed. Denied tooth, jaw, and leg pain. No recent or upcoming dental visits. Labs reviewed. Patient tolerated prolia injection and flu vaccination with no complaints voiced. See MAR for details. Patient stable during and after injection. Site clean and dry with no bruising or swelling noted. Band aid applied. Vss with discharge and left in satisfactory condition with no s/s of distress.

## 2022-05-30 ENCOUNTER — Encounter (HOSPITAL_COMMUNITY): Payer: Self-pay | Admitting: Hematology

## 2022-05-31 ENCOUNTER — Ambulatory Visit: Payer: Medicare Other | Admitting: Gastroenterology

## 2022-05-31 ENCOUNTER — Encounter: Payer: Self-pay | Admitting: Gastroenterology

## 2022-05-31 VITALS — BP 102/70 | HR 70 | Ht 65.0 in | Wt 145.4 lb

## 2022-05-31 DIAGNOSIS — R32 Unspecified urinary incontinence: Secondary | ICD-10-CM

## 2022-05-31 DIAGNOSIS — K599 Functional intestinal disorder, unspecified: Secondary | ICD-10-CM | POA: Diagnosis not present

## 2022-05-31 NOTE — Patient Instructions (Addendum)
If you are age 68 or older, your body mass index should be between 23-30. Your Body mass index is 24.19 kg/m. If this is out of the aforementioned range listed, please consider follow up with your Primary Care Provider.  If you are age 30 or younger, your body mass index should be between 19-25. Your Body mass index is 24.19 kg/m. If this is out of the aformentioned range listed, please consider follow up with your Primary Care Provider.   ________________________________________________________   We are referring you to Pelvic Floor Physical Therapy.  They will contact you directly to schedule an appointment.  It may take a week or more before you hear from them.  Please feel free to contact us if you have not heard from them within 2 weeks and we will follow up on the referral.   We will request samples of the following medication and let you know when we get them in: Xifaxan 550 mg  Thank you for entrusting me with your care and for choosing [facility], Dr. Sutherland Cellar

## 2022-05-31 NOTE — Progress Notes (Signed)
HPI :  66 year old female here for follow-up visit for altered bowel habits.  I last saw her for colonoscopy in August 2023.  She had a small sessile serrated polyp removed, otherwise the exam was normal and biopsies were negative for microscopic colitis.  Recall she has had some erratic bowel habits over time.  Sometimes she will have a few days without having any bowel movements and then would have episodes of large-volume stools that would really bother her.  Has been placed on a regimen of MiraLAX in the past, as well as fiber supplementation such as Metamucil/Benefiber.  She was still having some urgency to use the bathroom and having hard time getting things out at times.  She would also have to use the bathroom when she urinated.  She previously was using NSAIDs routinely but since stopped that.  She has tested negative for celiac disease earlier this year which was negative.  Thyroid normal.  Stool negative for lactoferrin.  Her colonoscopy showed no clear cause for her symptoms.  We had discussed options and she was going to use Imodium as needed for really loose stools or urgency, however she has been not needing to use too much of that lately.  Uses it about once per week.  She states she will have small formed stool with each bowel movement but sometimes a hard time or believing herself.  She denies any watery urgent stools for the past few months or so which is an improvement.  Every time she has to urinate she will pass stool, roughly 5-7 episodes per day.  Imodium does help formant up a little bit if she has some loose stools.  She does have urinary incontinence.  If she has loose stools she has a hard time holding it.  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  She has been hoping to see pelvic floor PT for her urinary incontinence.  She otherwise has a lot of gas and bloating  that bothers her associated with this.  If she is going on a trip she will usually use a laxative such as Dulcolax to help purge her bowel prior to leaving as this will reduce her chance for having urgent loose stools/overflow.   Endoscopic history: EGD 07/12/2015 - mild Shatski ring at GEJ, dilated to 66m, 3cm HH, benign gastric polyps / diverticulum,    Colonoscopy 03/26/2017 - pancolonic diverticulosis, internal hemorrhoids, solitary rectal ulcer - repeat colonoscopy in 10 years   EGD 05/2012 - no Barrett's, irregular zline EGD 04/2009 - distal  Esophageal stricture, no Barrett's EGD 04/2007 - 1cm suspected Barrett's however path negative for Barrett's, dilated to 48Fr EGD 04/2006 -  GEJ stricture, biopsies show (+) nondysplastic Barrett's, short segment 1cm or less Colonoscopy 01/2008 - hemorrhoids, diverticulosis, no polyps     EGD 05/01/21 -  - A 2 cm hiatal hernia was present. - Widely patent / mild Shatski ring at the GEJ. The exam of the esophagus was otherwise normal. No erosive changes or Barrett's esophagus. - Patchy inflammation characterized by erythema and granularity was found in the gastric antrum. No focal ulceration noted. - A diverticulum was found in the gastric fundus. - The exam of the stomach was otherwise normal. - Biopsies were taken with a cold forceps in the gastric body, at the incisura and in the gastric antrum for Helicobacter pylori testing. - The duodenal  bulb and second portion of the duodenum were normal.   Surgical [P], gastric antrum and gastric body - ANTRAL MUCOSA WITH REACTIVE GASTROPATHY. - OXYNTIC MUCOSA WITH HYPEREMIA. - WARTHIN-STARRY NEGATIVE FOR HELICOBACTER PYLORI. - NO INTESTINAL METAPLASIA, DYSPLASIA OR CARCINOMA.   CT scan abdomen / pelvis 04/23/21 -  IMPRESSION: Colonic diverticulosis. No radiographic evidence of diverticulitis or other acute findings.  Colonoscopy 03/19/22: Skin tags were found on perianal exam. - The colon was  rather tortuous with restricted mobility of the colon. - Multiple medium-mouthed diverticula were found in the entire colon. - A 3 mm polyp was found in the cecum. The polyp was flat. The polyp was removed with a cold snare. Resection and retrieval were complete. - A 3 to 4 mm polyp was found in the transverse colon. The polyp was sessile. The polyp was removed with a cold snare. Resection and retrieval were complete. - A diminutive polyp was found in the recto-sigmoid colon. The polyp was sessile. The polyp was removed with a cold snare. Resection and retrieval were complete. - Internal hemorrhoids were found during retroflexion. - There was poor air retention in the left colon which prolonged withdrawal. The exam was otherwise without abnormality. - Biopsies for histology were taken with a cold forceps from the right colon, left colon and transverse colon for evaluation of microscopic colitis.  1. Surgical [P], colon, recto-sigmoid, transverse and cecum, polyp (3) - HYPERPLASTIC POLYP WITH SOME FEATURES OF A SESSILE SERRATED POLYP 2. Surgical [P], colon nos, random sites - COLONIC MUCOSA WITHIN NORMAL LIMITS.  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   Arthritis    Axillary abscess    left   Barrett's esophagus    Blood transfusion without reported diagnosis    1989   Breast cancer of upper-outer quadrant of left female breast ([facility]) 07/11/2014   Cataract    both eyes in 2016   Cellulitis and abscess of unspecified site    Diverticulosis    Esophageal stricture    GERD (gastroesophageal reflux disease)    Heart murmur    Hepatitis C 1990   interferon treatment   Hiatal hernia    History of breast cancer    Hyperlipidemia    IBS (irritable bowel syndrome)    Internal hemorrhoids    Mitral valve prolapse    Neuropathy    Osteoporosis    Other specified disease of hair and hair follicles     Personal history of radiation therapy 2016   PONV (postoperative nausea and vomiting)    Postmenopausal 02/24/2018   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/05/14-10/04/14   Left Breast DCIS   Reflex sympathetic dystrophy    Rosacea    Scoliosis    adhesive arachnoditis-right side     Past Surgical History:  Procedure Laterality Date   [place], 2006   due to scoliosis= total 5 back surgeries   BREAST FIBROADENOMA SURGERY     bilateral   BREAST LUMPECTOMY Left    BREAST LUMPECTOMY WITH RADIOACTIVE SEED LOCALIZATION Left 08/05/2014   Procedure: LEFT PARTICAL MASTECTOMY WITH RADIOACTIVE SEED LOCALIZATION;  Surgeon: Fanny Skates, MD;  Location: [facility];  Service: General;  Laterality: Left;   cataracts Bilateral    [place]   COLONOSCOPY  2009   COLONOSCOPY WITH PROPOFOL  2018   Dr.Shonta Phillis   HYSTEROSCOPY  INCISE AND DRAIN ABCESS     left axillary abscess   OSTECTOMY Right 09/08/2019   Procedure: OSTECTOMY OF DISTAL FIBULA RIGHT ANKLE AND DEBRIDEMENT OF ULCERATION RIGHT ANKLE;  Surgeon: Caprice Beaver, DPM;  Location: [facility];  Service: Podiatry;  Laterality: Right;   SPINAL FUSION     spinal surgeries     TONSILLECTOMY     UPPER GASTROINTESTINAL ENDOSCOPY     Family History  Problem Relation Age of Onset   Heart disease Mother    Brain cancer Father        Glioblastoma   Melanoma Paternal Grandmother    Breast cancer Maternal Aunt        in her 41s   Lung cancer Maternal Aunt    Colon cancer Cousin    Stomach cancer Neg Hx    Rectal cancer Neg Hx    Esophageal cancer Neg Hx    Social History   Tobacco Use   Smoking status: Never   Smokeless tobacco: Never  Vaping Use   Vaping Use: Never used  Substance Use Topics   Alcohol use: Yes    Comment: 1 glass wine a month at most   Drug use: No   Current Outpatient Medications  Medication Sig Dispense Refill   acetaminophen (TYLENOL) 500 MG tablet Take 500 mg by mouth  every 6 (six) hours as needed.     Ascorbic Acid (VITA-C PO) Take 1 tablet by mouth daily.      Calcium Carb-Cholecalciferol 1000-800 MG-UNIT TABS Take 1 tablet by mouth daily.      denosumab (PROLIA) 60 MG/ML SOSY injection Inject 60 mg into the skin every 6 (six) months.     gabapentin (NEURONTIN) 300 MG capsule Take 300 mg by mouth 4 (four) times daily as needed (pain).      loperamide (IMODIUM A-D) 2 MG tablet Take 1 tablet (2 mg total) by mouth as needed for diarrhea or loose stools. 30 tablet 0   Multiple Vitamin (MULTIVITAMIN) tablet Take 1 tablet by mouth daily.      omeprazole (PRILOSEC) 20 MG capsule Take 1 capsule (20 mg total) by mouth 2 (two) times daily before a meal. 60 capsule 5   No current facility-administered medications for this visit.   No Active Allergies   Review of Systems: All systems reviewed and negative except where noted in HPI.    Lab Results  Component Value Date   WBC 12.3 (H) 05/08/2022   HGB 14.7 05/08/2022   HCT 44.2 05/08/2022   MCV 86 05/08/2022   PLT 260 05/08/2022    Lab Results  Component Value Date   CREATININE 0.59 05/29/2022   BUN 17 05/29/2022   NA 139 05/29/2022   K 4.4 05/29/2022   CL 101 05/29/2022   CO2 31 05/29/2022    Lab Results  Component Value Date   ALT 22 05/29/2022   AST 23 05/29/2022   ALKPHOS 60 05/29/2022   BILITOT 0.8 05/29/2022     Physical Exam: BP 102/70   Pulse 70   Ht '5\' 5"'$  (1.651 m)   Wt 145 lb 6 oz (65.9 kg)   BMI 24.19 kg/m  Constitutional: Pleasant,well-developed, female in no acute distress. Neurological: Alert and oriented to person place and time. Psychiatric: Normal mood and affect. Behavior is normal.   ASSESSMENT: 66 y.o. female here for assessment of the following  1. Bowel dysfunction   2. Urinary incontinence, unspecified type    She appears to have a functional bowel  disorder with erratic bowel, both constipation and loose stools at times.  She has not done well  unfortunately with conservative measures such as fiber supplementation or MiraLAX.  She continues to have a lot of gas and bloating and increased stool frequency although loose stools have been better lately.  Discussed options with her.  Going to try to give her empiric trial of rifaximin for 2 weeks to see if this will help reduce her gas and bloating and help with her bowel dysfunction.  No free samples available in the office today although we will put 1 aside for her when we do get 1 to see if that will help her as I do not think this will be covered by insurance.  Currently she is using Dulcolax as needed when constipated or prior to travel to reduce her stool burden / prevent incontinence and this has helped her.  If she has significant loose stool she can use Imodium as needed, needs to use this with caution as we do not want to precipitate constipation with worsening overflow.  She may have some pelvic floor dysfunction that is contributing to some of this, she clearly has urinary incontinence, perhaps related to her lower back issues.  I will refer her to pelvic floor PT for both her bowel and urinary incontinence to see if that will help.  She agrees with the plan, she can follow-up as needed if her symptoms persist.   PLAN: - trial of Rifaximin '550mg'$  TID for 2 weeks - sample -  - continue immodium PRN and dulcolax PRN - working relatively well for her compared to other regimens in the past - refer to pelvic floor PT for both urinary / fecal incontinence, nerve damage - follow up PRN  Jolly Mango, MD [facility]

## 2022-06-04 DIAGNOSIS — H9042 Sensorineural hearing loss, unilateral, left ear, with unrestricted hearing on the contralateral side: Secondary | ICD-10-CM | POA: Diagnosis not present

## 2022-06-04 DIAGNOSIS — R42 Dizziness and giddiness: Secondary | ICD-10-CM | POA: Diagnosis not present

## 2022-06-07 ENCOUNTER — Telehealth: Payer: Self-pay

## 2022-06-07 MED ORDER — RIFAXIMIN 550 MG PO TABS: 550.0000 mg | ORAL_TABLET | Freq: Three times a day (TID) | ORAL | 0 refills | Status: AC

## 2022-06-07 NOTE — Telephone Encounter (Signed)
Called and LM for patient that we rec'd Xifaxan samples. Asked her to call me back.

## 2022-06-07 NOTE — Telephone Encounter (Signed)
Patient will be here Monday to pick up medication.

## 2022-06-07 NOTE — Telephone Encounter (Signed)
Samples left at 2nd floor reception. Patient to take 1 tablet three times a day for 14 days

## 2022-06-12 ENCOUNTER — Ambulatory Visit (HOSPITAL_COMMUNITY)
Admission: RE | Admit: 2022-06-12 | Discharge: 2022-06-12 | Disposition: A | Discharge status: Home / Self Care | Payer: Medicare Other | Source: Ambulatory Visit | Attending: Otolaryngology | Admitting: Otolaryngology

## 2022-06-12 DIAGNOSIS — R42 Dizziness and giddiness: Secondary | ICD-10-CM

## 2022-06-12 DIAGNOSIS — H9190 Unspecified hearing loss, unspecified ear: Secondary | ICD-10-CM | POA: Diagnosis not present

## 2022-06-12 DIAGNOSIS — H918X3 Other specified hearing loss, bilateral: Secondary | ICD-10-CM | POA: Diagnosis not present

## 2022-06-12 MED ORDER — GADOBUTROL 1 MMOL/ML IV SOLN
6.5000 mL | Freq: Once | INTRAVENOUS | Status: AC | PRN
  Administered 2022-06-12: 6.5 mL via INTRAVENOUS

## 2022-06-18 ENCOUNTER — Ambulatory Visit: Payer: Medicare Other | Admitting: Physical Therapy

## 2022-08-12 ENCOUNTER — Encounter (HOSPITAL_COMMUNITY): Payer: Self-pay | Admitting: Hematology

## 2022-08-13 ENCOUNTER — Other Ambulatory Visit: Payer: Self-pay

## 2022-08-13 ENCOUNTER — Ambulatory Visit: Payer: Medicare Other | Attending: Gastroenterology | Admitting: Physical Therapy

## 2022-08-13 DIAGNOSIS — R279 Unspecified lack of coordination: Secondary | ICD-10-CM | POA: Insufficient documentation

## 2022-08-13 DIAGNOSIS — M6281 Muscle weakness (generalized): Secondary | ICD-10-CM | POA: Diagnosis not present
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
## 2022-08-26 DIAGNOSIS — K080 Exfoliation of teeth due to systemic causes: Secondary | ICD-10-CM | POA: Diagnosis not present

## 2022-08-29 ENCOUNTER — Ambulatory Visit: Payer: Medicare Other | Admitting: Physical Therapy

## 2022-09-03 ENCOUNTER — Ambulatory Visit: Payer: Medicare Other | Attending: Gastroenterology | Admitting: Physical Therapy

## 2022-09-03 DIAGNOSIS — R279 Unspecified lack of coordination: Secondary | ICD-10-CM | POA: Diagnosis not present

## 2022-09-03 DIAGNOSIS — M6281 Muscle weakness (generalized): Secondary | ICD-10-CM | POA: Diagnosis not present
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
## 2022-09-25 ENCOUNTER — Ambulatory Visit: Payer: Medicare Other | Admitting: Physical Therapy

## 2022-09-25 DIAGNOSIS — M6281 Muscle weakness (generalized): Secondary | ICD-10-CM

## 2022-09-25 DIAGNOSIS — R279 Unspecified lack of coordination: Secondary | ICD-10-CM | POA: Diagnosis not present
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
## 2022-09-26 DIAGNOSIS — M21371 Foot drop, right foot: Secondary | ICD-10-CM | POA: Diagnosis not present

## 2022-09-26 DIAGNOSIS — Z872 Personal history of diseases of the skin and subcutaneous tissue: Secondary | ICD-10-CM | POA: Diagnosis not present

## 2022-09-26 DIAGNOSIS — L83 Acanthosis nigricans: Secondary | ICD-10-CM | POA: Diagnosis not present

## 2022-10-02 ENCOUNTER — Encounter: Payer: Self-pay | Admitting: Physical Therapy

## 2022-10-02 ENCOUNTER — Ambulatory Visit: Payer: Medicare Other | Attending: Gastroenterology | Admitting: Physical Therapy

## 2022-10-02 DIAGNOSIS — R279 Unspecified lack of coordination: Secondary | ICD-10-CM | POA: Insufficient documentation

## 2022-10-02 DIAGNOSIS — M6281 Muscle weakness (generalized): Secondary | ICD-10-CM | POA: Diagnosis not present
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
## 2022-10-09 ENCOUNTER — Encounter: Payer: Medicare Other | Admitting: Physical Therapy

## 2022-10-16 ENCOUNTER — Ambulatory Visit: Payer: Medicare Other | Admitting: Physical Therapy

## 2022-10-16 DIAGNOSIS — M6281 Muscle weakness (generalized): Secondary | ICD-10-CM | POA: Diagnosis not present

## 2022-10-16 DIAGNOSIS — R279 Unspecified lack of coordination: Secondary | ICD-10-CM
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
## 2022-11-07 ENCOUNTER — Ambulatory Visit: Payer: Medicare Other | Attending: Gastroenterology | Admitting: Physical Therapy

## 2022-11-07 DIAGNOSIS — R279 Unspecified lack of coordination: Secondary | ICD-10-CM | POA: Insufficient documentation

## 2022-11-07 DIAGNOSIS — M6281 Muscle weakness (generalized): Secondary | ICD-10-CM | POA: Insufficient documentation
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
## 2022-11-25 ENCOUNTER — Ambulatory Visit (HOSPITAL_COMMUNITY)
Admission: RE | Admit: 2022-11-25 | Discharge: 2022-11-25 | Disposition: A | Discharge status: Home / Self Care | Payer: Medicare Other | Source: Ambulatory Visit | Attending: Hematology | Admitting: Hematology

## 2022-11-25 DIAGNOSIS — Z1231 Encounter for screening mammogram for malignant neoplasm of breast: Secondary | ICD-10-CM

## 2022-11-25 DIAGNOSIS — Z170 Estrogen receptor positive status [ER+]: Secondary | ICD-10-CM | POA: Diagnosis not present

## 2022-11-25 DIAGNOSIS — C50412 Malignant neoplasm of upper-outer quadrant of left female breast: Secondary | ICD-10-CM | POA: Diagnosis not present

## 2022-11-27 ENCOUNTER — Inpatient Hospital Stay: Payer: Medicare Other

## 2022-11-27 ENCOUNTER — Inpatient Hospital Stay: Payer: Medicare Other | Admitting: Hematology

## 2022-11-27 ENCOUNTER — Encounter (HOSPITAL_COMMUNITY): Payer: Self-pay | Admitting: Hematology

## 2022-11-28 ENCOUNTER — Inpatient Hospital Stay: Payer: Medicare Other

## 2022-11-28 ENCOUNTER — Inpatient Hospital Stay (HOSPITAL_BASED_OUTPATIENT_CLINIC_OR_DEPARTMENT_OTHER): Payer: Medicare Other | Admitting: Physician Assistant

## 2022-11-28 ENCOUNTER — Inpatient Hospital Stay: Payer: Medicare Other | Attending: Physician Assistant

## 2022-11-28 VITALS — BP 109/77 | HR 72 | Temp 98.2°F | Resp 17 | Ht 65.0 in | Wt 146.4 lb

## 2022-11-28 DIAGNOSIS — M549 Dorsalgia, unspecified: Secondary | ICD-10-CM | POA: Diagnosis not present

## 2022-11-28 DIAGNOSIS — M810 Age-related osteoporosis without current pathological fracture: Secondary | ICD-10-CM | POA: Insufficient documentation

## 2022-11-28 DIAGNOSIS — Z170 Estrogen receptor positive status [ER+]: Secondary | ICD-10-CM

## 2022-11-28 DIAGNOSIS — G8929 Other chronic pain: Secondary | ICD-10-CM | POA: Insufficient documentation

## 2022-11-28 DIAGNOSIS — C50412 Malignant neoplasm of upper-outer quadrant of left female breast: Secondary | ICD-10-CM | POA: Diagnosis not present

## 2022-11-28 DIAGNOSIS — R350 Frequency of micturition: Secondary | ICD-10-CM | POA: Insufficient documentation

## 2022-11-28 DIAGNOSIS — M79606 Pain in leg, unspecified: Secondary | ICD-10-CM | POA: Diagnosis not present

## 2022-11-28 DIAGNOSIS — Z780 Asymptomatic menopausal state: Secondary | ICD-10-CM

## 2022-11-28 DIAGNOSIS — R110 Nausea: Secondary | ICD-10-CM | POA: Insufficient documentation

## 2022-11-28 DIAGNOSIS — Z79899 Other long term (current) drug therapy: Secondary | ICD-10-CM | POA: Diagnosis not present

## 2022-11-28 DIAGNOSIS — R42 Dizziness and giddiness: Secondary | ICD-10-CM | POA: Diagnosis not present

## 2022-11-28 LAB — COMPREHENSIVE METABOLIC PANEL
ALT: 37 U/L (ref 0–44)
AST: 29 U/L (ref 15–41)
Albumin: 4.3 g/dL (ref 3.5–5.0)
Alkaline Phosphatase: 54 U/L (ref 38–126)
Anion gap: 10 (ref 5–15)
BUN: 14 mg/dL (ref 8–23)
CO2: 29 mmol/L (ref 22–32)
Calcium: 9.1 mg/dL (ref 8.9–10.3)
Chloride: 100 mmol/L (ref 98–111)
Creatinine, Ser: 0.57 mg/dL (ref 0.44–1.00)
GFR, Estimated: >60 mL/min (ref >60)
Glucose, Bld: 108 mg/dL — ABNORMAL HIGH (ref 70–99)
Potassium: 3.6 mmol/L (ref 3.5–5.1)
Sodium: 139 mmol/L (ref 135–145)
Total Bilirubin: 0.9 mg/dL (ref 0.3–1.2)
Total Protein: 7.1 g/dL (ref 6.5–8.1)

## 2022-11-28 LAB — CBC WITH DIFFERENTIAL/PLATELET
Abs Immature Granulocytes: 0.02 10*3/uL (ref 0.00–0.07)
Basophils Absolute: 0 10*3/uL (ref 0.0–0.1)
Basophils Relative: 1 %
Eosinophils Absolute: 0.1 10*3/uL (ref 0.0–0.5)
Eosinophils Relative: 2 %
HCT: 47.4 % — ABNORMAL HIGH (ref 36.0–46.0)
Hemoglobin: 14.8 g/dL (ref 12.0–15.0)
Immature Granulocytes: 0 %
Lymphocytes Relative: 22 %
Lymphs Abs: 1.2 10*3/uL (ref 0.7–4.0)
MCH: 29 pg (ref 26.0–34.0)
MCHC: 31.2 g/dL (ref 30.0–36.0)
MCV: 92.8 fL (ref 80.0–100.0)
Monocytes Absolute: 0.4 10*3/uL (ref 0.1–1.0)
Monocytes Relative: 7 %
Neutro Abs: 3.9 10*3/uL (ref 1.7–7.7)
Neutrophils Relative %: 68 %
Platelets: 240 10*3/uL (ref 150–400)
RBC: 5.11 MIL/uL (ref 3.87–5.11)
RDW: 13.2 % (ref 11.5–15.5)
WBC: 5.7 10*3/uL (ref 4.0–10.5)
nRBC: 0 % (ref 0.0–0.2)

## 2022-11-28 LAB — VITAMIN D 25 HYDROXY (VIT D DEFICIENCY, FRACTURES): Vit D, 25-Hydroxy: 21.05 ng/mL — ABNORMAL LOW (ref 30–100)

## 2022-11-28 MED ORDER — DENOSUMAB 60 MG/ML ~~LOC~~ SOSY
60.0000 mg | PREFILLED_SYRINGE | Freq: Once | SUBCUTANEOUS | Status: AC
  Administered 2022-11-28: 60 mg via SUBCUTANEOUS
  Filled 2022-11-28: qty 1

## 2022-11-28 NOTE — Progress Notes (Signed)
[facility] [address] [place] [address]   Patient Care Team: Carylon Perches, MD as PCP - General (Internal Medicine) Trey Sailors, MD as Referring Physician (Neurosurgery) Claud Kelp, MD as Consulting Physician (General Surgery) Malachy Mood, MD as Consulting Physician (Hematology) Lurline Hare, MD as Consulting Physician (Radiation Oncology) Hubbard Hartshorn, NP (Inactive) as Nurse Practitioner (Nurse Practitioner) Allene Pyo, MD (Inactive) as Consulting Physician (Hematology and Oncology)  SUMMARY OF ONCOLOGIC HISTORY: Oncology History  Breast cancer of upper-outer quadrant of left female breast (HCC)  07/05/2014 Breast US   Palpable left breast mass; U/S revealed irregular mass in UOQ of left breast with associated pleomorphic calcs. Mass and calcs span 1.5 cm.    07/07/2014 Initial Biopsy   Left breast needle core biopsy, 2 o'clock position revealed intermediate grade DCIS with calcs. ER+ (100%), PR+ (59%).    07/07/2014 Initial Diagnosis   Breast cancer of upper-outer quadrant of left female breast   07/12/2014 Breast MRI   UOQ of left breast with 2.0 x 1.4 x 1.3 cm biopsy-proven ductal carcinoma. No evidence of malignancy elsewhere in either breast. No adenopathy.    08/05/2014 Definitive Surgery   Left lumpectomy (Dr. Derrell Lolling) revealed intermediate grade DCIS, spanning 2.2 cm. Negative margins. 1 intramammary LN benign.    09/05/2014 - 10/04/2014 Radiation Therapy   Left breast: total radiation dose 42.72 Gy over 21 fractions.  Left breast boost: total radiation dose 10 Gy over 5 fractions.    09/22/2014 - 10/21/2014 Anti-estrogen oral therapy   Tamoxifen started by Dr. Galen Manila.  Planned duration of treatment: 5 years.    10/16/2014 Adverse Reaction   Did not feel well. Worsening pain/joint pain   10/28/2014 Survivorship   Survivorship Care Plan given and reviewed with patient in-person.      CHIEF COMPLIANT: Follow-up for Breast cancer of  upper-outer quadrant of left female breast, ER+/PR+   INTERVAL HISTORY: Ms. HAMDI VARI is a 67 y.o. female seen for follow-up of left breast cancer. She was last seen by Dr. Ellin Saba on 05/29/2022. In the interim, she denies any changes to her health. Her appetite and energy are fairly stable. She continues to have chronic back pain and leg pain. She reports after completing pelvic floor therapy, her urinary frequency has improved. She has occasional nausea episodes without vomiting. She denies any abdominal pain and bowel habits are unchanged. She denies easy bruising or signs of active bleeding. She denies fevers, chills, sweats, shortness of breath, chest pain or cough. She denies any new breast symptoms. Rest of the ROS is below.    REVIEW OF SYSTEMS:   Review of Systems  Constitutional:  Negative for appetite change, fatigue (80%) and fever.  Respiratory:  Negative for cough and shortness of breath.   Cardiovascular:  Negative for chest pain and leg swelling.  Gastrointestinal:  Positive for nausea. Negative for abdominal pain, constipation and diarrhea.  Genitourinary:  Positive for frequency (improved).   Musculoskeletal:  Positive for back pain (chronic pain that radiates to her legs).  Neurological:  Positive for dizziness (vertigo). Negative for headaches.  All other systems reviewed and are negative.   I have reviewed the past medical history, past surgical history, social history and family history with the patient and they are unchanged from previous note.   ALLERGIES:   has no active allergies.   MEDICATIONS:  Current Outpatient Medications  Medication Sig Dispense Refill   acetaminophen (TYLENOL) 500 MG tablet Take 500 mg  by mouth every 6 (six) hours as needed.     Ascorbic Acid (VITA-C PO) Take 1 tablet by mouth daily.      Calcium Carb-Cholecalciferol 1000-800 MG-UNIT TABS Take 1 tablet by mouth daily.      denosumab (PROLIA) 60 MG/ML SOSY injection Inject 60  mg into the skin every 6 (six) months.     gabapentin (NEURONTIN) 300 MG capsule Take 300 mg by mouth 4 (four) times daily as needed (pain).      loperamide (IMODIUM A-D) 2 MG tablet Take 1 tablet (2 mg total) by mouth as needed for diarrhea or loose stools. 30 tablet 0   Multiple Vitamin (MULTIVITAMIN) tablet Take 1 tablet by mouth daily.      omeprazole (PRILOSEC) 20 MG capsule Take 1 capsule (20 mg total) by mouth 2 (two) times daily before a meal. 60 capsule 5   No current facility-administered medications for this visit.     PHYSICAL EXAMINATION: Performance status (ECOG): 1 - Symptomatic but completely ambulatory  Vitals:   11/28/22 0949  BP: 109/77  Pulse: 72  Resp: 17  Temp: 98.2 F (36.8 C)  SpO2: 97%   Wt Readings from Last 3 Encounters:  11/28/22 146 lb 6.4 oz (66.4 kg)  05/31/22 145 lb 6 oz (65.9 kg)  05/29/22 146 lb 6.4 oz (66.4 kg)   Physical Exam Vitals reviewed.  Constitutional:      Appearance: Normal appearance.  Cardiovascular:     Rate and Rhythm: Normal rate and regular rhythm.     Pulses: Normal pulses.     Heart sounds: Normal heart sounds.  Pulmonary:     Effort: Pulmonary effort is normal.     Breath sounds: Normal breath sounds.  Chest:  Breasts:    Right: No swelling, bleeding, inverted nipple, mass, nipple discharge, skin change or tenderness.     Left: Normal. No swelling, bleeding, inverted nipple, mass, nipple discharge, skin change or tenderness.     Comments: Healed lumpectomy scar in upper quadrant of left breast.  Lymphadenopathy:     Upper Body:     Right upper body: No supraclavicular, axillary or pectoral adenopathy.     Left upper body: No supraclavicular, axillary or pectoral adenopathy.  Neurological:     General: No focal deficit present.     Mental Status: She is alert and oriented to person, place, and time.  Psychiatric:        Mood and Affect: Mood normal.        Behavior: Behavior normal.     LABORATORY DATA:  I  have reviewed the data as listed    Latest Ref Rng & Units 11/28/2022    9:01 AM 05/29/2022    1:23 PM 05/08/2022    4:54 PM  CMP  Glucose 70 - 99 mg/dL 098  99  119   BUN 8 - 23 mg/dL 14  17  16    Creatinine 0.44 - 1.00 mg/dL 1.47  8.29  5.62   Sodium 135 - 145 mmol/L 139  139  142   Potassium 3.5 - 5.1 mmol/L 3.6  4.4  4.5   Chloride 98 - 111 mmol/L 100  101  100   CO2 22 - 32 mmol/L 29  31  27    Calcium 8.9 - 10.3 mg/dL 9.1  9.6  13.0   Total Protein 6.5 - 8.1 g/dL 7.1  7.0  6.9   Total Bilirubin 0.3 - 1.2 mg/dL 0.9  0.8  0.3   Alkaline  Phos 38 - 126 U/L 54  60  72   AST 15 - 41 U/L 29  23  24    ALT 0 - 44 U/L 37  22  20    No results found for: "CAN153" Lab Results  Component Value Date   WBC 5.7 11/28/2022   HGB 14.8 11/28/2022   HCT 47.4 (H) 11/28/2022   MCV 92.8 11/28/2022   PLT 240 11/28/2022   NEUTROABS 3.9 11/28/2022    ASSESSMENT:  1.  DCIS of the left breast: -Biopsy on 07/07/2014, DCIS, ER/PR positive - Left lumpectomy on 08/05/2014, 2.2 cm DCIS, intermediate grade, 0/1 lymph node positive, PTis PN0.  ER/PR positive. - Underwent radiation therapy from 09/05/2014 through 10/04/2014. -Took tamoxifen for only 1 week and had developed arthralgias and myalgias and could not continue it.   2.  Osteoporosis: -DEXA scan on 10/03/2017 shows T score of -3.9. -She was started on Prolia on 03/10/2018.  PLAN:  1.  DCIS of the left breast: - Physical exam shows no concerning findings. Evidence of lumpectomy scar in upper quadrant of left breast that is healed.  No palpable masses in bilateral breast or adenopathy. - Mammogram on 11/25/2022 was BI-RADS Category 1-Negative. Repeat mammogram in 1 year.  - Labs today show no cytopenias, normal creatinine and LFTs.   -- Continue on surveillance and return in 1 year with labs and follow up.    2.  Osteoporosis: - DEXA scan (01/05/2021): T score -3.2, improved. - Calcium is 9.1 today.  Continue Prolia today and every 6 months. --Next  bone density scan due in June 2024.  --Continue with calcium, Vitamin D and weight bearing exercises.  Orders placed this encounter:  Orders Placed This Encounter  Procedures   DG Bone Density   MM 3D SCREENING MAMMOGRAM BILATERAL BREAST    The patient has a good understanding of the overall plan. She agrees with it. She will call with any problems that may develop before the next visit here.   I have spent a total of 30 minutes minutes of face-to-face and non-face-to-face time, preparing to see the patient, performing a medically appropriate examination, counseling and educating the patient, ordering tests/procedures,  documenting clinical information in the electronic health record, and care coordination.   Georga Kaufmann PA-C Dept of Hematology and Oncology [facility]

## 2022-11-28 NOTE — Progress Notes (Signed)
Prolia injection given per orders. Patient tolerated it well without problems. Vitals stable and discharged home from clinic ambulatory. Follow up as scheduled.  

## 2022-11-29 ENCOUNTER — Ambulatory Visit: Payer: Medicare Other | Admitting: Physical Therapy

## 2022-12-03 DIAGNOSIS — H9312 Tinnitus, left ear: Secondary | ICD-10-CM | POA: Diagnosis not present

## 2022-12-03 DIAGNOSIS — G629 Polyneuropathy, unspecified: Secondary | ICD-10-CM | POA: Diagnosis not present

## 2022-12-03 DIAGNOSIS — R42 Dizziness and giddiness: Secondary | ICD-10-CM | POA: Diagnosis not present

## 2022-12-03 DIAGNOSIS — Z6824 Body mass index (BMI) 24.0-24.9, adult: Secondary | ICD-10-CM | POA: Diagnosis not present

## 2022-12-03 DIAGNOSIS — H9042 Sensorineural hearing loss, unilateral, left ear, with unrestricted hearing on the contralateral side: Secondary | ICD-10-CM | POA: Diagnosis not present

## 2022-12-03 DIAGNOSIS — G894 Chronic pain syndrome: Secondary | ICD-10-CM | POA: Diagnosis not present

## 2022-12-03 DIAGNOSIS — R030 Elevated blood-pressure reading, without diagnosis of hypertension: Secondary | ICD-10-CM | POA: Diagnosis not present

## 2023-01-08 ENCOUNTER — Other Ambulatory Visit (HOSPITAL_COMMUNITY): Payer: Medicare Other

## 2023-02-05 ENCOUNTER — Other Ambulatory Visit: Payer: Self-pay

## 2023-02-05 MED ORDER — OMEPRAZOLE 20 MG PO CPDR: 20.0000 mg | DELAYED_RELEASE_CAPSULE | Freq: Two times a day (BID) | ORAL | 3 refills | Status: AC

## 2023-02-12 ENCOUNTER — Ambulatory Visit (HOSPITAL_COMMUNITY)
Admission: RE | Admit: 2023-02-12 | Discharge: 2023-02-12 | Disposition: A | Discharge status: Home / Self Care | Payer: Medicare Other | Source: Ambulatory Visit | Attending: Physician Assistant | Admitting: Physician Assistant

## 2023-02-12 DIAGNOSIS — M810 Age-related osteoporosis without current pathological fracture: Secondary | ICD-10-CM | POA: Diagnosis not present

## 2023-02-12 DIAGNOSIS — M85832 Other specified disorders of bone density and structure, left forearm: Secondary | ICD-10-CM | POA: Insufficient documentation

## 2023-02-12 DIAGNOSIS — Z780 Asymptomatic menopausal state: Secondary | ICD-10-CM | POA: Diagnosis not present

## 2023-02-12 DIAGNOSIS — C50412 Malignant neoplasm of upper-outer quadrant of left female breast: Secondary | ICD-10-CM | POA: Insufficient documentation

## 2023-02-12 DIAGNOSIS — Z170 Estrogen receptor positive status [ER+]: Secondary | ICD-10-CM | POA: Diagnosis not present

## 2023-02-18 DIAGNOSIS — C50412 Malignant neoplasm of upper-outer quadrant of left female breast: Secondary | ICD-10-CM | POA: Diagnosis not present

## 2023-03-27 DIAGNOSIS — K080 Exfoliation of teeth due to systemic causes: Secondary | ICD-10-CM | POA: Diagnosis not present

## 2023-05-30 ENCOUNTER — Other Ambulatory Visit: Payer: Self-pay

## 2023-05-30 DIAGNOSIS — M810 Age-related osteoporosis without current pathological fracture: Secondary | ICD-10-CM

## 2023-06-02 ENCOUNTER — Inpatient Hospital Stay: Payer: Medicare Other | Admitting: Hematology

## 2023-06-02 ENCOUNTER — Inpatient Hospital Stay: Payer: Medicare Other

## 2023-06-02 ENCOUNTER — Inpatient Hospital Stay: Payer: Medicare Other | Attending: Physician Assistant

## 2023-06-02 VITALS — BP 128/76 | HR 76 | Temp 98.2°F | Resp 17

## 2023-06-02 DIAGNOSIS — Z79899 Other long term (current) drug therapy: Secondary | ICD-10-CM | POA: Diagnosis not present

## 2023-06-02 DIAGNOSIS — Z780 Asymptomatic menopausal state: Secondary | ICD-10-CM

## 2023-06-02 DIAGNOSIS — C50412 Malignant neoplasm of upper-outer quadrant of left female breast: Secondary | ICD-10-CM | POA: Insufficient documentation

## 2023-06-02 DIAGNOSIS — M810 Age-related osteoporosis without current pathological fracture: Secondary | ICD-10-CM | POA: Insufficient documentation

## 2023-06-02 DIAGNOSIS — Z170 Estrogen receptor positive status [ER+]: Secondary | ICD-10-CM | POA: Diagnosis not present

## 2023-06-02 LAB — COMPREHENSIVE METABOLIC PANEL
ALT: 16 U/L (ref 0–44)
AST: 18 U/L (ref 15–41)
Albumin: 4.1 g/dL (ref 3.5–5.0)
Alkaline Phosphatase: 49 U/L (ref 38–126)
Anion gap: 10 (ref 5–15)
BUN: 14 mg/dL (ref 8–23)
CO2: 29 mmol/L (ref 22–32)
Calcium: 9.7 mg/dL (ref 8.9–10.3)
Chloride: 99 mmol/L (ref 98–111)
Creatinine, Ser: 0.57 mg/dL (ref 0.44–1.00)
GFR, Estimated: >60 mL/min (ref >60)
Glucose, Bld: 94 mg/dL (ref 70–99)
Potassium: 3.8 mmol/L (ref 3.5–5.1)
Sodium: 138 mmol/L (ref 135–145)
Total Bilirubin: 0.8 mg/dL (ref <1.2)
Total Protein: 7 g/dL (ref 6.5–8.1)

## 2023-06-02 MED ORDER — DENOSUMAB 60 MG/ML ~~LOC~~ SOSY
60.0000 mg | PREFILLED_SYRINGE | Freq: Once | SUBCUTANEOUS | Status: AC
  Administered 2023-06-02: 60 mg via SUBCUTANEOUS
  Filled 2023-06-02: qty 1

## 2023-06-02 NOTE — Patient Instructions (Signed)
[facility] - A DEPT OF [facility]  Discharge Instructions: Thank you for choosing [facility] to provide your oncology and hematology care.  If you have a lab appointment with the Cancer Center - please note that after April 8th, 2024, all labs will be drawn in the cancer center.  You do not have to check in or register with the main entrance as you have in the past but will complete your check-in in the cancer center.  Wear comfortable clothing and clothing appropriate for easy access to any Portacath or PICC line.   We strive to give you quality time with your provider. You may need to reschedule your appointment if you arrive late (15 or more minutes).  Arriving late affects you and other patients whose appointments are after yours.  Also, if you miss three or more appointments without notifying the office, you may be dismissed from the clinic at the provider's discretion.      For prescription refill requests, have your pharmacy contact our office and allow 72 hours for refills to be completed.    Today you received the following:  Prolia.  Denosumab Injection (Osteoporosis) What is this medication? DENOSUMAB (den oh SUE mab) prevents and treats osteoporosis. It works by Interior and spatial designer stronger and less likely to break (fracture). It is a monoclonal antibody. This medicine may be used for other purposes; ask your health care provider or pharmacist if you have questions. COMMON BRAND NAME(S): Prolia What should I tell my care team before I take this medication? They need to know if you have any of these conditions: Dental or gum disease Had thyroid or parathyroid (glands located in neck) surgery Having dental surgery or a tooth pulled Kidney disease Low levels of calcium in the blood On dialysis Poor nutrition Thyroid disease Trouble absorbing nutrients from your food An unusual or allergic reaction to denosumab, other medications,  foods, dyes, or preservatives Pregnant or trying to get pregnant Breastfeeding How should I use this medication? This medication is injected under the skin. It is given by your care team in a hospital or clinic setting. A special MedGuide will be given to you before each treatment. Be sure to read this information carefully each time. Talk to your care team about the use of this medication in children. Special care may be needed. Overdosage: If you think you have taken too much of this medicine contact a poison control center or emergency room at once. NOTE: This medicine is only for you. Do not share this medicine with others. What if I miss a dose? Keep appointments for follow-up doses. It is important not to miss your dose. Call your care team if you are unable to keep an appointment. What may interact with this medication? Do not take this medication with any of the following: Other medications that contain denosumab This medication may also interact with the following: Medications that lower your chance of fighting infection Steroid medications, such as prednisone or cortisone This list may not describe all possible interactions. Give your health care provider a list of all the medicines, herbs, non-prescription drugs, or dietary supplements you use. Also tell them if you smoke, drink alcohol, or use illegal drugs. Some items may interact with your medicine. What should I watch for while using this medication? Your condition will be monitored carefully while you are receiving this medication. You may need blood work done while taking this medication. This medication may  increase your risk of getting an infection. Call your care team for advice if you get a fever, chills, sore throat, or other symptoms of a cold or flu. Do not treat yourself. Try to avoid being around people who are sick. Tell your dentist and dental surgeon that you are taking this medication. You should not have major  dental surgery while on this medication. See your dentist to have a dental exam and fix any dental problems before starting this medication. Take good care of your teeth while on this medication. Make sure you see your dentist for regular follow-up appointments. This medication may cause low levels of calcium in your body. The risk of severe side effects is increased in people with kidney disease. Your care team may prescribe calcium and vitamin D to help prevent low calcium levels while you take this medication. It is important to take calcium and vitamin D as directed by your care team. Talk to your care team if you may be pregnant. Serious birth defects may occur if you take this medication during pregnancy and for 5 months after the last dose. You will need a negative pregnancy test before starting this medication. Contraception is recommended while taking this medication and for 5 months after the last dose. Your care team can help you find the option that works for you. Talk to your care team before breastfeeding. Changes to your treatment plan may be needed. What side effects may I notice from receiving this medication? Side effects that you should report to your care team as soon as possible: Allergic reactions--skin rash, itching, hives, swelling of the face, lips, tongue, or throat Infection--fever, chills, cough, sore throat, wounds that don't heal, pain or trouble when passing urine, general feeling of discomfort or being unwell Low calcium level--muscle pain or cramps, confusion, tingling, or numbness in the hands or feet Osteonecrosis of the jaw--pain, swelling, or redness in the mouth, numbness of the jaw, poor healing after dental work, unusual discharge from the mouth, visible bones in the mouth Severe bone, joint, or muscle pain Skin infection--skin redness, swelling, warmth, or pain Side effects that usually do not require medical attention (report these to your care team if they  continue or are bothersome): Back pain Headache Joint pain Muscle pain Pain in the hands, arms, legs, or feet Runny or stuffy nose Sore throat This list may not describe all possible side effects. Call your doctor for medical advice about side effects. You may report side effects to FDA at 1-800-FDA-1088. Where should I keep my medication? This medication is given in a hospital or clinic. It will not be stored at home. NOTE: This sheet is a summary. It may not cover all possible information. If you have questions about this medicine, talk to your doctor, pharmacist, or health care provider.  2024 Elsevier/Gold Standard (2022-08-20 00:00:00)     To help prevent nausea and vomiting after your treatment, we encourage you to take your nausea medication as directed.  BELOW ARE SYMPTOMS THAT SHOULD BE REPORTED IMMEDIATELY: *FEVER GREATER THAN 100.4 F (38 C) OR HIGHER *CHILLS OR SWEATING *NAUSEA AND VOMITING THAT IS NOT CONTROLLED WITH YOUR NAUSEA MEDICATION *UNUSUAL SHORTNESS OF BREATH *UNUSUAL BRUISING OR BLEEDING *URINARY PROBLEMS (pain or burning when urinating, or frequent urination) *BOWEL PROBLEMS (unusual diarrhea, constipation, pain near the anus) TENDERNESS IN MOUTH AND THROAT WITH OR WITHOUT PRESENCE OF ULCERS (sore throat, sores in mouth, or a toothache) UNUSUAL RASH, SWELLING OR PAIN  UNUSUAL VAGINAL DISCHARGE OR  ITCHING   Items with * indicate a potential emergency and should be followed up as soon as possible or go to the Emergency Department if any problems should occur.  Please show the CHEMOTHERAPY ALERT CARD or IMMUNOTHERAPY ALERT CARD at check-in to the Emergency Department and triage nurse.  Should you have questions after your visit or need to cancel or reschedule your appointment, please contact [facility] - A DEPT OF [facility] [phone]  and follow the prompts.  Office hours are 8:00 a.m. to 4:30 p.m. Monday - Friday. Please  note that voicemails left after 4:00 p.m. may not be returned until the following business day.  We are closed weekends and major holidays. You have access to a nurse at all times for urgent questions. Please call the main number to the clinic [phone] and follow the prompts.  For any non-urgent questions, you may also contact your provider using MyChart. We now offer e-Visits for anyone 50 and older to request care online for non-urgent symptoms. For details visit [email or web address].   Also download the MyChart app! Go to the app store, search "MyChart", open the app, select [facility], and log in with your MyChart username and password.

## 2023-06-02 NOTE — Progress Notes (Signed)
Patient presents today for Prolia injection.  Patient is in satisfactory condition with no new complaints voiced.  Vital signs are stable. Labs reviewed.  Patient is taking calcium and vitamin D supplements daily.  We will proceed with injection per provider orders.  Patient tolerated injection with no complaints voiced.  Site clean and dry with no bruising or swelling noted.  No complaints of pain.  Discharged with vital signs stable and no signs or symptoms of distress noted.

## 2023-06-10 ENCOUNTER — Encounter (INDEPENDENT_AMBULATORY_CARE_PROVIDER_SITE_OTHER): Payer: Self-pay

## 2023-06-10 ENCOUNTER — Ambulatory Visit (INDEPENDENT_AMBULATORY_CARE_PROVIDER_SITE_OTHER): Payer: Medicare Other | Admitting: Otolaryngology

## 2023-06-10 VITALS — Ht 65.0 in | Wt 142.0 lb

## 2023-06-10 DIAGNOSIS — H9042 Sensorineural hearing loss, unilateral, left ear, with unrestricted hearing on the contralateral side: Secondary | ICD-10-CM

## 2023-06-10 DIAGNOSIS — R42 Dizziness and giddiness: Secondary | ICD-10-CM

## 2023-06-10 DIAGNOSIS — T161XXA Foreign body in right ear, initial encounter: Secondary | ICD-10-CM | POA: Diagnosis not present

## 2023-06-14 DIAGNOSIS — H9042 Sensorineural hearing loss, unilateral, left ear, with unrestricted hearing on the contralateral side: Secondary | ICD-10-CM | POA: Insufficient documentation

## 2023-06-14 DIAGNOSIS — R42 Dizziness and giddiness: Secondary | ICD-10-CM | POA: Insufficient documentation

## 2023-06-14 DIAGNOSIS — T161XXA Foreign body in right ear, initial encounter: Secondary | ICD-10-CM | POA: Insufficient documentation

## 2023-06-14 NOTE — Progress Notes (Signed)
Patient ID: Amanda Terrell, female   DOB: [birth year], 67 y.o.   MRN: [id number]  Follow-up: Recurrent dizziness, left ear hearing loss  HPI: The patient is a 67 year old female who returns today for her follow-up evaluation.  The patient was previously seen for recurrent dizziness and left ear hearing loss.  She was noted to have asymmetric left ear hearing loss, recurrent spinning vertigo, left ear tinnitus, and left ear pressure.  She was diagnosed with left ear Mnire's disease.  Her MRI scan in November 2023 was negative for retrocochlear lesion.  The patient was treated with a 1500 mg low-salt diet.  The patient returns today reporting minimal dizziness.  She continues to have occasional left ear tinnitus.  Currently she denies any otalgia, otorrhea, or recent change in her hearing.  Exam: General: Communicates without difficulty, well nourished, no acute distress. Head: Normocephalic, no evidence injury, no tenderness, facial buttresses intact without stepoff. Face/sinus: No tenderness to palpation and percussion. Facial movement is normal and symmetric. Eyes: PERRL, EOMI. No scleral icterus, conjunctivae clear. Neuro: CN II exam reveals vision grossly intact.  No nystagmus at any point of gaze. Ears: Auricles well formed without lesions.  A right ear foreign body is noted.  The left ear canal and tympanic membrane are normal.  Nose: External evaluation reveals normal support and skin without lesions.  Dorsum is intact.  Anterior rhinoscopy reveals pink mucosa over anterior aspect of inferior turbinates and intact septum.  No purulence noted. Oral:  Oral cavity and oropharynx are intact, symmetric, without erythema or edema.  Mucosa is moist without lesions. Neck: Full range of motion without pain.  There is no significant lymphadenopathy.  No masses palpable.  Thyroid bed within normal limits to palpation.  Parotid glands and submandibular glands equal bilaterally without mass.  Trachea is midline.  Neuro:  CN 2-12 grossly intact. Gait wide based. Vestibular: No nystagmus at any point of gaze. Dix Hallpike negative. Vestibular: There is no nystagmus with pneumatic pressure on either tympanic membrane or Valsalva. The cerebellar examination is unremarkable.   Procedure: Right ear foreign body removal.   Anesthesia: None Description of the procedure: The patient is placed on a supine position.  Under the operating microscope, the right ear canal is examined.  A foreign body (hearing aid dome) is noted to be impacted on the medial aspect of the ear canal.  Under the microscope, the foreign body is carefully removed with an alligator forceps and a 90 hook.  After foreign body removal, the tympanic membrane and middle ear space are noted to be normal.  The patient tolerated the procedure well.   Assessment: 1.  Incidental finding of a right ear canal foreign body.  After the foreign body removal procedure, both tympanic membranes and middle ear spaces are noted to be normal. 2.  The patient's left ear Mnire's symptoms are currently under control. 3.  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  Plan: 1.  Otomicroscopy with right ear foreign body removal. 2.  Continue with the 1500 mg low-salt diet. 3.  Continue the use of her hearing aids. 4.  The patient will return for reevaluation in 6 months, sooner if needed.

## 2023-06-19 DIAGNOSIS — R0602 Shortness of breath: Secondary | ICD-10-CM | POA: Diagnosis not present

## 2023-06-19 DIAGNOSIS — Z9181 History of falling: Secondary | ICD-10-CM | POA: Diagnosis not present

## 2023-06-19 DIAGNOSIS — G629 Polyneuropathy, unspecified: Secondary | ICD-10-CM | POA: Diagnosis not present

## 2023-06-19 DIAGNOSIS — G894 Chronic pain syndrome: Secondary | ICD-10-CM | POA: Diagnosis not present

## 2023-06-19 DIAGNOSIS — Z79899 Other long term (current) drug therapy: Secondary | ICD-10-CM | POA: Diagnosis not present

## 2023-06-19 DIAGNOSIS — R030 Elevated blood-pressure reading, without diagnosis of hypertension: Secondary | ICD-10-CM | POA: Diagnosis not present

## 2023-06-19 DIAGNOSIS — Z6824 Body mass index (BMI) 24.0-24.9, adult: Secondary | ICD-10-CM | POA: Diagnosis not present

## 2023-06-24 DIAGNOSIS — Z79899 Other long term (current) drug therapy: Secondary | ICD-10-CM | POA: Diagnosis not present

## 2023-07-15 DIAGNOSIS — H04123 Dry eye syndrome of bilateral lacrimal glands: Secondary | ICD-10-CM | POA: Diagnosis not present

## 2023-08-07 DIAGNOSIS — H0014 Chalazion left upper eyelid: Secondary | ICD-10-CM | POA: Diagnosis not present

## 2023-08-07 DIAGNOSIS — H01004 Unspecified blepharitis left upper eyelid: Secondary | ICD-10-CM | POA: Diagnosis not present

## 2023-08-07 DIAGNOSIS — H01002 Unspecified blepharitis right lower eyelid: Secondary | ICD-10-CM | POA: Diagnosis not present

## 2023-08-07 DIAGNOSIS — H01001 Unspecified blepharitis right upper eyelid: Secondary | ICD-10-CM | POA: Diagnosis not present

## 2023-08-28 DIAGNOSIS — H0014 Chalazion left upper eyelid: Secondary | ICD-10-CM | POA: Diagnosis not present

## 2023-09-29 ENCOUNTER — Other Ambulatory Visit (INDEPENDENT_AMBULATORY_CARE_PROVIDER_SITE_OTHER): Payer: Self-pay | Admitting: Otolaryngology

## 2023-09-29 DIAGNOSIS — R519 Headache, unspecified: Secondary | ICD-10-CM

## 2023-09-29 DIAGNOSIS — R42 Dizziness and giddiness: Secondary | ICD-10-CM

## 2023-09-30 ENCOUNTER — Telehealth (INDEPENDENT_AMBULATORY_CARE_PROVIDER_SITE_OTHER): Payer: Self-pay | Admitting: Otolaryngology

## 2023-09-30 ENCOUNTER — Encounter: Payer: Self-pay | Admitting: Neurology

## 2023-09-30 NOTE — Telephone Encounter (Signed)
 Patient called yesterday with multiple complaints including 5 episodes of vertigo with vomiting and headaches in the past week. Pain in right leg ( groin area ) times one year with black and blue color. Also stated that Sunday she had uncontrolled bowel movement and incontinence. She wanted a referral to a neurologist. Dr. Suszanne Conners is aware of this and a referral has ben sent.

## 2023-10-01 DIAGNOSIS — K080 Exfoliation of teeth due to systemic causes: Secondary | ICD-10-CM | POA: Diagnosis not present

## 2023-10-09 DIAGNOSIS — H0014 Chalazion left upper eyelid: Secondary | ICD-10-CM | POA: Diagnosis not present

## 2023-11-19 DIAGNOSIS — K080 Exfoliation of teeth due to systemic causes: Secondary | ICD-10-CM | POA: Diagnosis not present

## 2023-11-20 DIAGNOSIS — K080 Exfoliation of teeth due to systemic causes: Secondary | ICD-10-CM | POA: Diagnosis not present

## 2023-11-26 ENCOUNTER — Ambulatory Visit: Admitting: Neurology

## 2023-11-26 ENCOUNTER — Encounter: Payer: Self-pay | Admitting: Neurology

## 2023-11-26 ENCOUNTER — Other Ambulatory Visit: Payer: Self-pay

## 2023-11-26 VITALS — BP 120/72 | Ht 65.0 in | Wt 145.0 lb

## 2023-11-26 DIAGNOSIS — R42 Dizziness and giddiness: Secondary | ICD-10-CM

## 2023-11-26 DIAGNOSIS — M961 Postlaminectomy syndrome, not elsewhere classified: Secondary | ICD-10-CM | POA: Diagnosis not present

## 2023-11-26 DIAGNOSIS — C50412 Malignant neoplasm of upper-outer quadrant of left female breast: Secondary | ICD-10-CM

## 2023-11-26 DIAGNOSIS — M810 Age-related osteoporosis without current pathological fracture: Secondary | ICD-10-CM

## 2023-11-26 MED ORDER — NORTRIPTYLINE HCL 10 MG PO CAPS: ORAL_CAPSULE | ORAL | 6 refills | Status: AC

## 2023-11-26 NOTE — Patient Instructions (Addendum)
 Good to meet you.  Let's do a therapeutic trial with nortriptyline and monitor if there is improvement in symptoms. Start Nortriptyline 10mg : take 1 capsule every night for 2 weeks, then increase to 2 capsules every night  2. Keep a calendar of the episodes as we start medication  3. Referral will be sent to Dr. Rolando Cliche at [facility] for second opinion  4. Follow-up in 3 months, call for any changes

## 2023-11-26 NOTE — Progress Notes (Unsigned)
 NEUROLOGY CONSULTATION NOTE  Amanda Terrell MRN: [id number] DOB: 1955-11-06  Referring provider: Dr. Reynold Caves Primary care provider: Dr. Dorena Gander  Reason for consult:  headaches  Dear Dr Darlin Ehrlich:  Thank you for your kind referral of Amanda Terrell for consultation of the above symptoms. Although her history is well known to you, please allow me to reiterate it for the purpose of our medical record. The patient was accompanied to the clinic by her husband who also provides collateral information. Records and images were personally reviewed where available.   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 She states she would like help with the vertigo and re-establishing care with [facility]. She reports that she lost hearing on the left ear and had vertigo in 2004 that resolved and went quiet for 20 years until 2023 when she started having intermittent episodes of vertigo. She recalls hearing a whooshing sound in her left ear, she couldn't quite focus then 2 days later started having really bad spinning sensation with sweating and arm weakness, violent vomiting. She would also have bowel and bladder incontinence at times. Symptoms usually last from 45 minutes to 4 hours. She reports going a year without any episodes until 08/2023, she reports 12 episodes since 08/31/23. One of them at the end of April had lasted 2 days where she could not get out of bed. They usually start with hearing her heartbeat/whooshing in the left ear, diaphoresis. She reported a headache to Dr. Darlin Ehrlich but today reports she does not typically have headaches and if she has one, it is not bad, they would not stay with her. She may have diaphoresis without full spinning if she sits very still. She denies any  loss of consciousness but came close to passing out one night when she was so sick. She has noticed that when she is vomiting, chronic right-sided leg pain worsens and her right leg would turn black. She has a complicated history with her back, she has been to [facility] in 2010, [facility] in 2016, Dr. Johanna Mutter with [facility] in 2018, and Dr. Joanette Moynahan with [facility] in 2020. She has been seeing Pain Management with [facility] She would like referral to Dr. Rolando Cliche at [facility] for local care. On review of Dr. Alisia Irons note from 2018,    Right leg hurting extreme, right leg turned black Waiting for specialist for peroneal and femoral, in [place]  Vertigo, throwing up violently, 2.5hrs of vomiting with bowel incont with right leg turning black Gbp does not get rid of pain, 7/10 all the time; losing ability to walk Has had 12 episodes since 2/2, one was 2 day episode, Easter Sun to [place] could not get out of bed; usually an hour done with it; the sweat and weakness A year ago went to [facility] sitting, bec all of a sudden sweating Went a year without any until 08/2023; can hear heartbeat and whooshing on the left ear Also has cervical disease  No diff swallowing; no loss of consicousness; one night so sick, right leg was black, closest to passing out 0/5 right foot, uses 2 canes +2 both UE, left knee, absent left knee Absent right patella, ankle No clonus Discoloration of right LE better when elevated, cold easily No sensation below right knee  Sometimes bowel just go, then not go on her own; under control a little bit; can control Can hold urination, has a little leakage    Failed back syndrome, RSD at [facility] 2020 last visit MRI lumbar 2016: Postoperative changes of discectomy and laminectomies at L1-L5 with fusion across the intervertebral disc spaces and posterior elements.  2. Marked expansion of the bony spinal canal with CSF intensity fluid from approximately  the T12 level to the upper sacrum with irregular lobular contour along the margins of the spinal canal, with expansion of CSF surrounding multiple exiting nerve roots. 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 No clear evidence of pseudomeningocele.  3. The conus medullaris is tethered or displaced to the dorsal aspect of the spinal canal  and the cauda equina is displaced along the periphery of the spinal canal. These findings may be due to adhesions or displacement by an intradural CSF intensity collection/mass.    3/4: multiple complaints including 5 episodes of vertigo with vomiting and headaches in the past week. Pain in right leg ( groin area ) times one year with black and blue color. 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   Arthritis    Axillary abscess    left   Barrett's esophagus    Blood transfusion without reported diagnosis    1989   Breast cancer of upper-outer quadrant of left female breast (HCC) 07/11/2014   Cataract    both eyes in 2016   Cellulitis and abscess of unspecified site    Diverticulosis    Esophageal stricture    GERD (gastroesophageal reflux disease)    Heart murmur    Hepatitis C 1990   interferon treatment   Hiatal hernia    History of breast cancer    Hyperlipidemia    IBS (irritable bowel syndrome)    Internal hemorrhoids    Mitral valve prolapse    Neuropathy    Osteoporosis    Other specified disease of hair and hair follicles    Personal history of radiation therapy 2016   PONV (postoperative nausea and vomiting)    Postmenopausal 02/24/2018   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/05/14-10/04/14   Left Breast DCIS   Reflex sympathetic dystrophy    Rosacea    Scoliosis    adhesive arachnoditis-right side     PAST SURGICAL HISTORY: Past Surgical History:  Procedure Laterality Date   BACK SURGERY  1969, 1989, 2006   due to scoliosis= total 5 back surgeries   BREAST FIBROADENOMA SURGERY     bilateral   BREAST LUMPECTOMY Left    BREAST LUMPECTOMY WITH RADIOACTIVE SEED LOCALIZATION Left 08/05/2014   Procedure: LEFT PARTICAL MASTECTOMY WITH RADIOACTIVE SEED LOCALIZATION;  Surgeon: Boyce Byes, MD;  Location: [facility];  Service: General;  Laterality: Left;   cataracts Bilateral    CESAREAN SECTION  1976, 1978, 1980, 1984   COLONOSCOPY  2009   COLONOSCOPY WITH PROPOFOL   2018   Dr.Armbruster   HYSTEROSCOPY     INCISE AND DRAIN ABCESS     left axillary abscess   OSTECTOMY Right 09/08/2019   Procedure: OSTECTOMY OF DISTAL FIBULA RIGHT ANKLE AND DEBRIDEMENT OF ULCERATION RIGHT ANKLE;  Surgeon: Iverson Market, DPM;  Location: [facility];  Service: Podiatry;  Laterality: Right;   SPINAL FUSION     spinal surgeries     TONSILLECTOMY     UPPER GASTROINTESTINAL ENDOSCOPY      MEDICATIONS: Current Outpatient Medications on File Prior to Visit  Medication Sig Dispense Refill   acetaminophen  (TYLENOL ) 500 MG tablet Take 500 mg by mouth every 6 (six) hours as needed.     Ascorbic Acid (VITA-C PO) Take 1 tablet by mouth daily.      cholecalciferol  (VITAMIN D3) 25 MCG (1000 UNIT) tablet Take 1,000 Units by mouth daily.     denosumab  (PROLIA ) 60 MG/ML SOSY injection Inject 60 mg into the skin every 6 (six) months.     gabapentin (NEURONTIN) 300 MG capsule Take 300 mg by mouth 5 (five) times daily.     loperamide  (IMODIUM  A-D) 2 MG tablet Take 1 tablet (2 mg total) by mouth as needed for diarrhea or loose stools. 30 tablet 0   Multiple Vitamin (MULTIVITAMIN) tablet Take 1 tablet by mouth daily.      omeprazole  (PRILOSEC) 20 MG capsule Take 1 capsule (20 mg total) by mouth 2 (two) times daily before a meal. 60 capsule 3   Calcium Carb-Cholecalciferol  1000-800 MG-UNIT TABS Take 1  tablet by mouth daily.  (Patient not taking: Reported on 11/26/2023)     No current facility-administered medications on file prior to visit.    ALLERGIES: No Active Allergies  FAMILY HISTORY: Family History  Problem Relation Age of Onset   Heart disease Mother    Brain cancer Father        Glioblastoma   Melanoma Paternal Grandmother    Breast cancer Maternal Aunt        in her 28s   Lung cancer Maternal Aunt    Colon cancer Cousin    Stomach cancer Neg Hx    Rectal cancer Neg Hx    Esophageal cancer Neg Hx     SOCIAL HISTORY: Social History   Socioeconomic History   Marital status: Married    Spouse name: Not on file   Number of children: Not on file   Years of education: Not on file   Highest education level: Not on file  Occupational History   Not on file  Tobacco Use   Smoking status: Never   Smokeless tobacco: Never  Vaping Use   Vaping status: Never Used  Substance and Sexual Activity   Alcohol use: Yes    Comment: 1 glass wine a month at most   Drug use: No   Sexual activity: Not Currently    Comment: 1st intercourse- 16, partners- 1, married- 46 yrs   Other Topics Concern   Not on file  Social History Narrative   Right handed   Social Drivers of Health   Financial Resource Strain: Not on file  Food Insecurity: Not on file  Transportation Needs: Not on file  Physical Activity: Not on file  Stress: Not on file  Social Connections: Not on file  Intimate Partner Violence: Not on file     PHYSICAL EXAM: Vitals:   11/26/23 1030  BP: 120/72   General: No acute distress Head:  Normocephalic/atraumatic Skin/Extremities: No rash, no edema Neurological Exam: Mental status: alert and oriented to person, place, and time, no dysarthria or aphasia, Fund of knowledge is appropriate.  Recent and remote memory are intact.  Attention and concentration are normal.    Able to name objects and repeat phrases. Cranial nerves: CN I: not tested CN II: pupils  equal,  round and reactive to light, visual fields intact CN III, IV, VI:  full range of motion, no nystagmus, no ptosis CN V: facial sensation intact CN VII: upper and lower face symmetric CN VIII: hearing intact to conversation CN IX, X: gag intact, uvula midline CN XI: sternocleidomastoid and trapezius muscles intact CN XII: tongue midline Bulk & Tone: normal, no fasciculations. Motor: 5/5 throughout with no pronator drift. Sensation: intact to light touch, cold, pin, vibration sense.  No extinction to double simultaneous stimulation.  Romberg test *** Deep Tendon Reflexes: +2 throughout, no ankle clonus Plantar responses: downgoing bilaterally Cerebellar: no incoordination on finger to nose testing Gait: narrow-based and steady, able to tandem walk adequately. Tremor: ***   IMPRESSION: This is a *** year old ***-handed *** with a history of ***.    The duration of this appointment visit was *** minutes of face-to-face time with the patient.  Greater than 50% of this time was spent in counseling, explanation of diagnosis, planning of further management, and coordination of care.  Thank you for allowing me to participate in the care of this patient. Please do not hesitate to call for any questions or concerns.   Rayfield Cairo, M.D.  CC: ***

## 2023-11-27 ENCOUNTER — Ambulatory Visit (HOSPITAL_COMMUNITY): Payer: Medicare Other

## 2023-11-27 ENCOUNTER — Inpatient Hospital Stay: Payer: Medicare Other | Attending: Hematology

## 2023-11-27 ENCOUNTER — Ambulatory Visit (HOSPITAL_COMMUNITY)
Admission: RE | Admit: 2023-11-27 | Discharge: 2023-11-27 | Disposition: A | Discharge status: Home / Self Care | Source: Ambulatory Visit | Attending: Hematology | Admitting: Hematology

## 2023-11-27 ENCOUNTER — Other Ambulatory Visit (HOSPITAL_COMMUNITY): Payer: Self-pay | Admitting: Hematology

## 2023-11-27 DIAGNOSIS — Z808 Family history of malignant neoplasm of other organs or systems: Secondary | ICD-10-CM | POA: Insufficient documentation

## 2023-11-27 DIAGNOSIS — Z1721 Progesterone receptor positive status: Secondary | ICD-10-CM | POA: Insufficient documentation

## 2023-11-27 DIAGNOSIS — Z923 Personal history of irradiation: Secondary | ICD-10-CM | POA: Insufficient documentation

## 2023-11-27 DIAGNOSIS — Z8249 Family history of ischemic heart disease and other diseases of the circulatory system: Secondary | ICD-10-CM | POA: Insufficient documentation

## 2023-11-27 DIAGNOSIS — R928 Other abnormal and inconclusive findings on diagnostic imaging of breast: Secondary | ICD-10-CM | POA: Diagnosis not present

## 2023-11-27 DIAGNOSIS — Z8719 Personal history of other diseases of the digestive system: Secondary | ICD-10-CM | POA: Insufficient documentation

## 2023-11-27 DIAGNOSIS — C50412 Malignant neoplasm of upper-outer quadrant of left female breast: Secondary | ICD-10-CM | POA: Diagnosis not present

## 2023-11-27 DIAGNOSIS — Z9089 Acquired absence of other organs: Secondary | ICD-10-CM | POA: Insufficient documentation

## 2023-11-27 DIAGNOSIS — Z801 Family history of malignant neoplasm of trachea, bronchus and lung: Secondary | ICD-10-CM | POA: Diagnosis not present

## 2023-11-27 DIAGNOSIS — Z800 Family history of malignant neoplasm of digestive organs: Secondary | ICD-10-CM | POA: Diagnosis not present

## 2023-11-27 DIAGNOSIS — Z170 Estrogen receptor positive status [ER+]: Secondary | ICD-10-CM | POA: Diagnosis not present

## 2023-11-27 DIAGNOSIS — Z803 Family history of malignant neoplasm of breast: Secondary | ICD-10-CM | POA: Diagnosis not present

## 2023-11-27 DIAGNOSIS — M810 Age-related osteoporosis without current pathological fracture: Secondary | ICD-10-CM | POA: Diagnosis not present

## 2023-11-27 DIAGNOSIS — Z853 Personal history of malignant neoplasm of breast: Secondary | ICD-10-CM | POA: Insufficient documentation

## 2023-11-27 DIAGNOSIS — Z86018 Personal history of other benign neoplasm: Secondary | ICD-10-CM | POA: Diagnosis not present

## 2023-11-27 DIAGNOSIS — Z79899 Other long term (current) drug therapy: Secondary | ICD-10-CM | POA: Diagnosis not present

## 2023-11-27 DIAGNOSIS — Z1231 Encounter for screening mammogram for malignant neoplasm of breast: Secondary | ICD-10-CM | POA: Insufficient documentation

## 2023-11-27 LAB — COMPREHENSIVE METABOLIC PANEL WITH GFR
ALT: 12 U/L (ref 0–44)
AST: 18 U/L (ref 15–41)
Albumin: 4.1 g/dL (ref 3.5–5.0)
Alkaline Phosphatase: 53 U/L (ref 38–126)
Anion gap: 10 (ref 5–15)
BUN: 15 mg/dL (ref 8–23)
CO2: 29 mmol/L (ref 22–32)
Calcium: 9.8 mg/dL (ref 8.9–10.3)
Chloride: 99 mmol/L (ref 98–111)
Creatinine, Ser: 0.57 mg/dL (ref 0.44–1.00)
GFR, Estimated: >60 mL/min (ref >60)
Glucose, Bld: 115 mg/dL — ABNORMAL HIGH (ref 70–99)
Potassium: 3.7 mmol/L (ref 3.5–5.1)
Sodium: 138 mmol/L (ref 135–145)
Total Bilirubin: 0.8 mg/dL (ref 0.0–1.2)
Total Protein: 7 g/dL (ref 6.5–8.1)

## 2023-11-27 LAB — CBC WITH DIFFERENTIAL/PLATELET
Abs Immature Granulocytes: 0.02 10*3/uL (ref 0.00–0.07)
Basophils Absolute: 0 10*3/uL (ref 0.0–0.1)
Basophils Relative: 1 %
Eosinophils Absolute: 0.1 10*3/uL (ref 0.0–0.5)
Eosinophils Relative: 3 %
HCT: 48.7 % — ABNORMAL HIGH (ref 36.0–46.0)
Hemoglobin: 14.9 g/dL (ref 12.0–15.0)
Immature Granulocytes: 0 %
Lymphocytes Relative: 25 %
Lymphs Abs: 1.4 10*3/uL (ref 0.7–4.0)
MCH: 27.8 pg (ref 26.0–34.0)
MCHC: 30.6 g/dL (ref 30.0–36.0)
MCV: 90.9 fL (ref 80.0–100.0)
Monocytes Absolute: 0.4 10*3/uL (ref 0.1–1.0)
Monocytes Relative: 7 %
Neutro Abs: 3.5 10*3/uL (ref 1.7–7.7)
Neutrophils Relative %: 64 %
Platelets: 232 10*3/uL (ref 150–400)
RBC: 5.36 MIL/uL — ABNORMAL HIGH (ref 3.87–5.11)
RDW: 13.2 % (ref 11.5–15.5)
WBC: 5.4 10*3/uL (ref 4.0–10.5)
nRBC: 0 % (ref 0.0–0.2)

## 2023-11-27 LAB — VITAMIN D 25 HYDROXY (VIT D DEFICIENCY, FRACTURES): Vit D, 25-Hydroxy: 35.63 ng/mL (ref 30–100)

## 2023-12-01 ENCOUNTER — Ambulatory Visit: Payer: Medicare Other | Admitting: Hematology

## 2023-12-01 ENCOUNTER — Other Ambulatory Visit (HOSPITAL_COMMUNITY): Payer: Self-pay | Admitting: Hematology

## 2023-12-01 ENCOUNTER — Other Ambulatory Visit: Payer: Medicare Other

## 2023-12-01 ENCOUNTER — Ambulatory Visit: Payer: Medicare Other

## 2023-12-01 DIAGNOSIS — R928 Other abnormal and inconclusive findings on diagnostic imaging of breast: Secondary | ICD-10-CM

## 2023-12-03 NOTE — Progress Notes (Signed)
 [facility] [address] [place] [address]    Clinic Day:  12/04/2023  Referring physician: Artemisa Bile, MD  Patient Care Team: Dorena Gander, MD as PCP - General (Family Medicine) Tawana Fast, MD as Referring Physician (Neurosurgery) Boyce Byes, MD as Consulting Physician (General Surgery) Sonja Wahkiakum, MD as Consulting Physician (Hematology) Beverlee Bucco, MD as Consulting Physician (Radiation Oncology) Alanna Alley, NP (Inactive) as Nurse Practitioner (Nurse Practitioner) Estanislao Heimlich, MD (Inactive) as Consulting Physician (Hematology and Oncology) Jhonny Moss, MD as Consulting Physician (Neurology)   ASSESSMENT & PLAN:   Assessment: 1.  DCIS of the left breast: -Biopsy on 07/07/2014, DCIS, ER/PR positive - Left lumpectomy on 08/05/2014, 2.2 cm DCIS, intermediate grade, 0/1 lymph node positive, PTis PN0.  ER/PR positive. - Underwent radiation therapy from 09/05/2014 through 10/04/2014. -Took tamoxifen  for only 1 week and had developed arthralgias and myalgias and could not continue it.  Plan: 1.  DCIS of the left breast: - She could not take her tamoxifen  due to arthralgias and myalgias. - Physical exam: No palpable adenopathy in the supraclavicular or axillary region. - Reviewed labs from 11/27/2023: Normal LFTs and creatinine.  CBC grossly normal. - Mammogram from 11/27/2023: BI-RADS Category 0 with diagnostic mammogram and ultrasound of the right breast scheduled in first week of June. - RTC 1 year for follow-up.   2.  Osteoporosis (DEXA 01/05/2021 T-score -3.2): - Calcium is 9.8.  Vitamin D  is 35.6.  Continue calcium and vitamin D  supplements. - Continue Prolia  today and every 6 months.  Consider repeating DEXA scan next year.   Breast Cancer therapy associated bone loss: I have recommended calcium, Vitamin D  and weight bearing exercises.  Orders Placed This Encounter  Procedures   MM DIAG BREAST TOMO BILATERAL    Standing Status:    Future    Expected Date:   11/26/2024    Expiration Date:   12/03/2024    Reason for Exam (SYMPTOM  OR DIAGNOSIS REQUIRED):   breast cancer screening    Preferred imaging location?:   [facility]      I,Helena R Teague,acting as a scribe for Paulett Boros, MD.,have documented all relevant documentation on the behalf of Paulett Boros, MD,as directed by  Paulett Boros, MD while in the presence of Paulett Boros, MD.   I, Paulett Boros MD, have reviewed the above documentation for accuracy and completeness, and I agree with the above.   Paulett Boros, MD   5/8/20252:45 PM  CHIEF COMPLAINT:   Diagnosis: Malignant neoplasm of upper-outer quadrant of left breast in female    Cancer Staging  Breast cancer of upper-outer quadrant of left female breast [facility]) Staging form: Breast, AJCC 7th Edition - Clinical stage from 07/13/2014: Stage 0 (Tis (DCIS), N0, M0) - Unsigned - Pathologic stage from 08/08/2014: Stage Unknown (Tis (DCIS), NX, cM0) - Signed by Macy Scale, MD on 08/15/2014   HISTORY OF PRESENT ILLNESS:   Oncology History  Breast cancer of upper-outer quadrant of left female breast (HCC)  07/05/2014 Breast US    Palpable left breast mass; U/S revealed irregular mass in UOQ of left breast with associated pleomorphic calcs. Mass and calcs span 1.5 cm.    07/07/2014 Initial Biopsy   Left breast needle core biopsy, 2 o'clock position revealed intermediate grade DCIS with calcs. ER+ (100%), PR+ (59%).    07/07/2014 Initial Diagnosis   Breast cancer of upper-outer quadrant of left female breast   07/12/2014 Breast MRI  UOQ of left breast with 2.0 x 1.4 x 1.3 cm biopsy-proven ductal carcinoma. No evidence of malignancy elsewhere in either breast. No adenopathy.    08/05/2014 Definitive Surgery   Left lumpectomy (Dr. Lauralee Poll) revealed intermediate grade DCIS, spanning 2.2 cm. Negative margins. 1 intramammary LN benign.    09/05/2014 - 10/04/2014  Radiation Therapy   Left breast: total radiation dose 42.72 Gy over 21 fractions.  Left breast boost: total radiation dose 10 Gy over 5 fractions.    09/22/2014 - 10/21/2014 Anti-estrogen oral therapy   Tamoxifen  started by Dr. Everette Hives.  Planned duration of treatment: 5 years.    10/16/2014 Adverse Reaction   Did not feel well. Worsening pain/joint pain   10/28/2014 Survivorship   Survivorship Care Plan given and reviewed with patient in-person.       INTERVAL HISTORY:   Amanda Terrell is a 68 y.o. female presenting to clinic today for follow up of ER+ left breast cancer. She was last seen by me on 05/29/22.  Since her last visit, she underwent bilateral MM screening on 11/27/23 that found: Further evaluation is suggested for possible distortion in the right breast.  Bonde density scan from 02/12/23 had a T-score of -3.3, which is considered osteoporotic.   Today, she states that she is doing well overall. Her appetite level is at 100%. Her energy level is at 80%.  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   Arthritis    Axillary abscess    left   Barrett's esophagus    Blood transfusion without reported diagnosis    1989   Breast cancer of upper-outer quadrant of left female breast (HCC) 07/11/2014   Cataract    both eyes in 2016   Cellulitis and abscess of unspecified site    Diverticulosis    Esophageal stricture    GERD (gastroesophageal reflux disease)    Heart murmur    Hepatitis C 1990   interferon treatment   Hiatal hernia    History of breast cancer    Hyperlipidemia    IBS (irritable bowel syndrome)    Internal hemorrhoids    Mitral valve prolapse    Neuropathy    Osteoporosis    Other specified disease of hair and hair follicles    Personal history of radiation therapy 2016   PONV (postoperative nausea and vomiting)    Postmenopausal 02/24/2018   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/05/14-10/04/14   Left  Breast DCIS   Reflex sympathetic dystrophy    Rosacea    Scoliosis    adhesive arachnoditis-right side    Surgical History: Past Surgical History:  Procedure Laterality Date   BACK SURGERY  1969, 1989, 2006   due to scoliosis= total 5 back surgeries   BREAST FIBROADENOMA SURGERY     bilateral   BREAST LUMPECTOMY Left    BREAST LUMPECTOMY WITH RADIOACTIVE SEED LOCALIZATION Left 08/05/2014   Procedure: LEFT PARTICAL MASTECTOMY WITH RADIOACTIVE SEED LOCALIZATION;  Surgeon: Boyce Byes, MD;  Location:  SURGERY CENTER;  Service: General;  Laterality: Left;   cataracts Bilateral    CESAREAN SECTION  1976, 1978, 1980, 1984   COLONOSCOPY  2009   COLONOSCOPY WITH PROPOFOL   2018   Dr.Armbruster   HYSTEROSCOPY     INCISE AND DRAIN ABCESS     left axillary abscess   OSTECTOMY Right 09/08/2019   Procedure: OSTECTOMY OF DISTAL FIBULA RIGHT ANKLE AND DEBRIDEMENT OF ULCERATION RIGHT ANKLE;  Surgeon: Iverson Market, DPM;  Location: [facility];  Service: Podiatry;  Laterality: Right;   SPINAL FUSION     spinal surgeries     TONSILLECTOMY     UPPER GASTROINTESTINAL ENDOSCOPY      Social History: Social History   Socioeconomic History   Marital status: Married    Spouse name: Not on file   Number of children: Not on file   Years of education: Not on file   Highest education level: Not on file  Occupational History   Not on file  Tobacco Use   Smoking status: Never   Smokeless tobacco: Never  Vaping Use   Vaping status: Never Used  Substance and Sexual Activity   Alcohol use: Yes    Comment: 1 glass wine a month at most   Drug use: No   Sexual activity: Not Currently    Comment: 1st intercourse- 16, partners- 1, married- 46 yrs   Other Topics Concern   Not on file  Social History Narrative   Right handed   Social Drivers of Health   Financial Resource Strain: Not on file  Food Insecurity: Not on file  Transportation Needs: Not on file  Physical Activity: Not  on file  Stress: Not on file  Social Connections: Not on file  Intimate Partner Violence: Not on file    Family History: Family History  Problem Relation Age of Onset   Heart disease Mother    Brain cancer Father        Glioblastoma   Melanoma Paternal Grandmother    Breast cancer Maternal Aunt        in her 64s   Lung cancer Maternal Aunt    Colon cancer Cousin    Stomach cancer Neg Hx    Rectal cancer Neg Hx    Esophageal cancer Neg Hx     Current Medications:  Current Outpatient Medications:    acetaminophen  (TYLENOL ) 500 MG tablet, Take 500 mg by mouth every 6 (six) hours as needed., Disp: , Rfl:    Ascorbic Acid (VITA-C PO), Take 1 tablet by mouth daily. , Disp: , Rfl:    Calcium Carb-Cholecalciferol  1000-800 MG-UNIT TABS, Take 1 tablet by mouth daily., Disp: , Rfl:    cholecalciferol  (VITAMIN D3) 25 MCG (1000 UNIT) tablet, Take 1,000 Units by mouth daily., Disp: , Rfl:    denosumab  (PROLIA ) 60 MG/ML SOSY injection, Inject 60 mg into the skin every 6 (six) months., Disp: , Rfl:    gabapentin (NEURONTIN) 300 MG capsule, Take 300 mg by mouth 5 (five) times daily., Disp: , Rfl:    ibuprofen (ADVIL) 800 MG tablet, Take 800 mg by mouth 3 (three) times daily., Disp: , Rfl:    loperamide  (IMODIUM  A-D) 2 MG tablet, Take 1 tablet (2 mg total) by mouth as needed for diarrhea or loose stools., Disp: 30 tablet, Rfl: 0   Multiple Vitamin (MULTIVITAMIN) tablet, Take 1 tablet by mouth daily. , Disp: , Rfl:    nortriptyline  (PAMELOR ) 10 MG capsule, Take 1 capsule every night for 2 weeks, then increase to 2 capsules every night, Disp: 60 capsule, Rfl: 6   omeprazole  (PRILOSEC) 20 MG capsule, Take 1 capsule (20 mg total) by mouth 2 (two) times daily before a meal., Disp: 60 capsule, Rfl: 3 No current facility-administered medications for this visit.  Facility-Administered Medications Ordered in Other Visits:    denosumab  (PROLIA ) injection 60 mg, 60 mg, Subcutaneous, Once, Thayil, Irene T,  PA-C   Allergies: No Active  Allergies  REVIEW OF SYSTEMS:   Review of Systems  Constitutional:  Negative for chills, fatigue and fever.  HENT:   Negative for lump/mass, mouth sores, nosebleeds, sore throat and trouble swallowing.   Eyes:  Negative for eye problems.  Respiratory:  Negative for cough and shortness of breath.   Cardiovascular:  Negative for chest pain, leg swelling and palpitations.  Gastrointestinal:  Negative for abdominal pain, constipation, diarrhea, nausea and vomiting.  Genitourinary:  Negative for bladder incontinence, difficulty urinating, dysuria, frequency, hematuria and nocturia.   Musculoskeletal:  Negative for arthralgias, back pain, flank pain, myalgias and neck pain.  Skin:  Negative for itching and rash.  Neurological:  Positive for dizziness, headaches and numbness (Right leg).  Hematological:  Does not bruise/bleed easily.  Psychiatric/Behavioral:  Negative for depression, sleep disturbance and suicidal ideas. The patient is not nervous/anxious.   All other systems reviewed and are negative.    VITALS:   Blood pressure 128/70, pulse 71, temperature (!) 97.2 F (36.2 C), temperature source Tympanic, resp. rate 18, height 5' 4.5" (1.638 m), weight 144 lb (65.3 kg), SpO2 100%.  Wt Readings from Last 3 Encounters:  12/04/23 144 lb (65.3 kg)  11/26/23 145 lb (65.8 kg)  06/10/23 142 lb (64.4 kg)    Body mass index is 24.34 kg/m.  Performance status (ECOG): 1 - Symptomatic but completely ambulatory  PHYSICAL EXAM:   Physical Exam Vitals and nursing note reviewed. Exam conducted with a chaperone present.  Constitutional:      Appearance: Normal appearance.  Cardiovascular:     Rate and Rhythm: Normal rate and regular rhythm.     Pulses: Normal pulses.     Heart sounds: Normal heart sounds.  Pulmonary:     Effort: Pulmonary effort is normal.     Breath sounds: Normal breath sounds.  Abdominal:     Palpations: Abdomen is soft. There is no  hepatomegaly, splenomegaly or mass.     Tenderness: There is no abdominal tenderness.  Musculoskeletal:     Right lower leg: No edema.     Left lower leg: No edema.  Lymphadenopathy:     Cervical: No cervical adenopathy.     Right cervical: No superficial, deep or posterior cervical adenopathy.    Left cervical: No superficial, deep or posterior cervical adenopathy.     Upper Body:     Right upper body: No supraclavicular or axillary adenopathy.     Left upper body: No supraclavicular or axillary adenopathy.  Neurological:     General: No focal deficit present.     Mental Status: She is alert and oriented to person, place, and time.  Psychiatric:        Mood and Affect: Mood normal.        Behavior: Behavior normal.     LABS:   CBC     Component Value Date/Time   WBC 5.4 11/27/2023 1050   RBC 5.36 (H) 11/27/2023 1050   HGB 14.9 11/27/2023 1050   HGB 14.7 05/08/2022 1654   HGB 14.7 07/13/2014 1220   HCT 48.7 (H) 11/27/2023 1050   HCT 44.2 05/08/2022 1654   HCT 45.7 07/13/2014 1220   PLT 232 11/27/2023 1050   PLT 260 05/08/2022 1654   MCV 90.9 11/27/2023 1050   MCV 86 05/08/2022 1654   MCV 87.9 07/13/2014 1220   MCH 27.8 11/27/2023 1050   MCHC 30.6 11/27/2023 1050   RDW 13.2 11/27/2023 1050   RDW 13.2 05/08/2022 1654   RDW 13.5  07/13/2014 1220   LYMPHSABS 1.4 11/27/2023 1050   LYMPHSABS 0.9 05/08/2022 1654   LYMPHSABS 1.3 07/13/2014 1220   MONOABS 0.4 11/27/2023 1050   MONOABS 0.5 07/13/2014 1220   EOSABS 0.1 11/27/2023 1050   EOSABS 0.0 05/08/2022 1654   BASOSABS 0.0 11/27/2023 1050   BASOSABS 0.1 05/08/2022 1654   BASOSABS 0.1 07/13/2014 1220    CMP      Component Value Date/Time   NA 138 11/27/2023 1050   NA 142 05/08/2022 1654   NA 142 07/13/2014 1220   K 3.7 11/27/2023 1050   K 4.3 07/13/2014 1220   CL 99 11/27/2023 1050   CO2 29 11/27/2023 1050   CO2 30 (H) 07/13/2014 1220   GLUCOSE 115 (H) 11/27/2023 1050   GLUCOSE 92 07/13/2014 1220   BUN  15 11/27/2023 1050   BUN 16 05/08/2022 1654   BUN 12.6 07/13/2014 1220   CREATININE 0.57 11/27/2023 1050   CREATININE 0.7 07/13/2014 1220   CALCIUM 9.8 11/27/2023 1050   CALCIUM 9.9 07/13/2014 1220   PROT 7.0 11/27/2023 1050   PROT 6.9 05/08/2022 1654   PROT 7.1 07/13/2014 1220   ALBUMIN 4.1 11/27/2023 1050   ALBUMIN 4.8 05/08/2022 1654   ALBUMIN 4.4 07/13/2014 1220   AST 18 11/27/2023 1050   AST 17 07/13/2014 1220   ALT 12 11/27/2023 1050   ALT 14 07/13/2014 1220   ALKPHOS 53 11/27/2023 1050   ALKPHOS 78 07/13/2014 1220   BILITOT 0.8 11/27/2023 1050   BILITOT 0.3 05/08/2022 1654   BILITOT 0.58 07/13/2014 1220   GFRNONAA >60 11/27/2023 1050   GFRAA >60 03/23/2020 1013     No results found for: "CEA1", "CEA" / No results found for: "CEA1", "CEA" No results found for: "PSA1" No results found for: "ZOX096" No results found for: "CAN125"  No results found for: "TOTALPROTELP", "ALBUMINELP", "A1GS", "A2GS", "BETS", "BETA2SER", "GAMS", "MSPIKE", "SPEI" Lab Results  Component Value Date   FERRITIN 51.3 07/10/2011   IRONPCTSAT 15.0 (L) 07/10/2011   IRONPCTSAT 20.4 07/16/2010   IRONPCTSAT 18.8 (L) 05/17/2009   Lab Results  Component Value Date   LDH 130 03/28/2021   LDH 124 09/25/2020   LDH 119 03/23/2020     STUDIES:   MM 3D SCREENING MAMMOGRAM BILATERAL BREAST Result Date: 12/01/2023 CLINICAL DATA:  Screening. EXAM: DIGITAL SCREENING BILATERAL MAMMOGRAM WITH TOMOSYNTHESIS AND CAD TECHNIQUE: Bilateral screening digital craniocaudal and mediolateral oblique mammograms were obtained. Bilateral screening digital breast tomosynthesis was performed. The images were evaluated with computer-aided detection. COMPARISON:  Previous exam(s). ACR Breast Density Category c: The breasts are heterogeneously dense, which may obscure small masses. FINDINGS: In the right breast, possible distortion warrants further evaluation. In the left breast, no findings suspicious for malignancy.  IMPRESSION: Further evaluation is suggested for possible distortion in the right breast. RECOMMENDATION: Diagnostic mammogram and possibly ultrasound of the right breast. (Code:[id number]) The patient will be contacted regarding the findings, and additional imaging will be scheduled. BI-RADS CATEGORY  0: Incomplete: Need additional imaging evaluation. Electronically Signed   By: Allena Ito M.D.   On: 12/01/2023 13:37

## 2023-12-04 ENCOUNTER — Inpatient Hospital Stay: Payer: Medicare Other | Admitting: Hematology

## 2023-12-04 ENCOUNTER — Inpatient Hospital Stay: Payer: Medicare Other

## 2023-12-04 VITALS — BP 128/70 | HR 71 | Temp 97.2°F | Resp 18 | Ht 64.5 in | Wt 144.0 lb

## 2023-12-04 DIAGNOSIS — C50412 Malignant neoplasm of upper-outer quadrant of left female breast: Secondary | ICD-10-CM | POA: Diagnosis not present

## 2023-12-04 DIAGNOSIS — Z800 Family history of malignant neoplasm of digestive organs: Secondary | ICD-10-CM | POA: Diagnosis not present

## 2023-12-04 DIAGNOSIS — Z808 Family history of malignant neoplasm of other organs or systems: Secondary | ICD-10-CM | POA: Diagnosis not present

## 2023-12-04 DIAGNOSIS — Z780 Asymptomatic menopausal state: Secondary | ICD-10-CM

## 2023-12-04 DIAGNOSIS — Z1231 Encounter for screening mammogram for malignant neoplasm of breast: Secondary | ICD-10-CM | POA: Diagnosis not present

## 2023-12-04 DIAGNOSIS — Z8719 Personal history of other diseases of the digestive system: Secondary | ICD-10-CM | POA: Diagnosis not present

## 2023-12-04 DIAGNOSIS — K080 Exfoliation of teeth due to systemic causes: Secondary | ICD-10-CM | POA: Diagnosis not present

## 2023-12-04 DIAGNOSIS — Z9089 Acquired absence of other organs: Secondary | ICD-10-CM | POA: Diagnosis not present

## 2023-12-04 DIAGNOSIS — Z170 Estrogen receptor positive status [ER+]: Secondary | ICD-10-CM | POA: Diagnosis not present

## 2023-12-04 DIAGNOSIS — Z803 Family history of malignant neoplasm of breast: Secondary | ICD-10-CM | POA: Diagnosis not present

## 2023-12-04 DIAGNOSIS — Z86018 Personal history of other benign neoplasm: Secondary | ICD-10-CM | POA: Diagnosis not present

## 2023-12-04 DIAGNOSIS — Z8249 Family history of ischemic heart disease and other diseases of the circulatory system: Secondary | ICD-10-CM | POA: Diagnosis not present

## 2023-12-04 DIAGNOSIS — M810 Age-related osteoporosis without current pathological fracture: Secondary | ICD-10-CM | POA: Diagnosis not present

## 2023-12-04 DIAGNOSIS — Z923 Personal history of irradiation: Secondary | ICD-10-CM | POA: Diagnosis not present

## 2023-12-04 DIAGNOSIS — Z801 Family history of malignant neoplasm of trachea, bronchus and lung: Secondary | ICD-10-CM | POA: Diagnosis not present

## 2023-12-04 DIAGNOSIS — Z79899 Other long term (current) drug therapy: Secondary | ICD-10-CM | POA: Diagnosis not present

## 2023-12-04 DIAGNOSIS — Z1721 Progesterone receptor positive status: Secondary | ICD-10-CM | POA: Diagnosis not present

## 2023-12-04 MED ORDER — DENOSUMAB 60 MG/ML ~~LOC~~ SOSY
60.0000 mg | PREFILLED_SYRINGE | Freq: Once | SUBCUTANEOUS | Status: AC
Start: 2023-12-04 — End: 2023-12-04
  Administered 2023-12-04: 60 mg via SUBCUTANEOUS
  Filled 2023-12-04: qty 1

## 2023-12-04 NOTE — Progress Notes (Signed)
Prolia injection given per orders. Patient tolerated it well without problems. Vitals stable and discharged home from clinic ambulatory. Follow up as scheduled.  

## 2023-12-04 NOTE — Patient Instructions (Addendum)
 [facility] at [facility] Discharge Instructions   You were seen and examined today by Dr. Cheree Cords.  He reviewed the results of your lab work which are normal/stable.   We will proceed with your Prolia  injection today.   Return as scheduled.    Thank you for choosing [facility] at [facility] to provide your oncology and hematology care.  To afford each patient quality time with our provider, please arrive at least 15 minutes before your scheduled appointment time.   If you have a lab appointment with the Cancer Center please come in thru the Main Entrance and check in at the main information desk.  You need to re-schedule your appointment should you arrive 10 or more minutes late.  We strive to give you quality time with our providers, and arriving late affects you and other patients whose appointments are after yours.  Also, if you no show three or more times for appointments you may be dismissed from the clinic at the providers discretion.     Again, thank you for choosing [facility].  Our hope is that these requests will decrease the amount of time that you wait before being seen by our physicians.       _____________________________________________________________  Should you have questions after your visit to [facility], please contact our office at [phone] and follow the prompts.  Our office hours are 8:00 a.m. and 4:30 p.m. Monday - Friday.  Please note that voicemails left after 4:00 p.m. may not be returned until the following business day.  We are closed weekends and major holidays.  You do have access to a nurse 24-7, just call the main number to the clinic [phone] and do not press any options, hold on the line and a nurse will answer the phone.    For prescription refill requests, have your pharmacy contact our office and allow 72 hours.    Due to Covid, you will need to wear a mask upon  entering the hospital. If you do not have a mask, a mask will be given to you at the Main Entrance upon arrival. For doctor visits, patients may have 1 support person age 63 or older with them. For treatment visits, patients can not have anyone with them due to social distancing guidelines and our immunocompromised population.

## 2023-12-09 ENCOUNTER — Ambulatory Visit (INDEPENDENT_AMBULATORY_CARE_PROVIDER_SITE_OTHER): Payer: Medicare Other | Admitting: Otolaryngology

## 2023-12-09 ENCOUNTER — Encounter (HOSPITAL_COMMUNITY): Payer: Self-pay

## 2023-12-09 ENCOUNTER — Encounter (INDEPENDENT_AMBULATORY_CARE_PROVIDER_SITE_OTHER): Payer: Self-pay | Admitting: Otolaryngology

## 2023-12-09 ENCOUNTER — Ambulatory Visit (HOSPITAL_COMMUNITY)
Admission: RE | Admit: 2023-12-09 | Discharge: 2023-12-09 | Discharge status: Home / Self Care | Source: Ambulatory Visit | Attending: Hematology | Admitting: Hematology

## 2023-12-09 ENCOUNTER — Ambulatory Visit (HOSPITAL_COMMUNITY)
Admission: RE | Admit: 2023-12-09 | Discharge: 2023-12-09 | Disposition: A | Discharge status: Home / Self Care | Source: Ambulatory Visit | Attending: Hematology | Admitting: Hematology

## 2023-12-09 VITALS — BP 114/72 | HR 78 | Ht 65.0 in | Wt 144.0 lb

## 2023-12-09 DIAGNOSIS — H9192 Unspecified hearing loss, left ear: Secondary | ICD-10-CM | POA: Diagnosis not present

## 2023-12-09 DIAGNOSIS — H8102 Meniere's disease, left ear: Secondary | ICD-10-CM | POA: Diagnosis not present

## 2023-12-09 DIAGNOSIS — H9312 Tinnitus, left ear: Secondary | ICD-10-CM | POA: Diagnosis not present

## 2023-12-09 DIAGNOSIS — N6001 Solitary cyst of right breast: Secondary | ICD-10-CM | POA: Diagnosis not present

## 2023-12-09 DIAGNOSIS — Z853 Personal history of malignant neoplasm of breast: Secondary | ICD-10-CM | POA: Diagnosis not present

## 2023-12-09 DIAGNOSIS — H9042 Sensorineural hearing loss, unilateral, left ear, with unrestricted hearing on the contralateral side: Secondary | ICD-10-CM

## 2023-12-09 DIAGNOSIS — R928 Other abnormal and inconclusive findings on diagnostic imaging of breast: Secondary | ICD-10-CM | POA: Insufficient documentation

## 2023-12-09 DIAGNOSIS — R92333 Mammographic heterogeneous density, bilateral breasts: Secondary | ICD-10-CM | POA: Diagnosis not present

## 2023-12-09 DIAGNOSIS — R42 Dizziness and giddiness: Secondary | ICD-10-CM

## 2023-12-09 NOTE — Progress Notes (Unsigned)
 Patient ID: Amanda Terrell, female   DOB: July 18, 1956, 68 y.o.   MRN: [id number]  Follow-up: Recurrent dizziness, left ear hearing loss   HPI: The patient is a 68 year old female who returns today for her follow-up evaluation.  The patient has a history of recurrent dizziness, asymmetric left ear hearing loss, left ear tinnitus, and left aural pressure.  She was diagnosed with left ear Mnire's disease.  Her MRI scan was negative for retrocochlear lesion.  The patient was treated with a 1500 mg low-salt diet.  At her last visit 6 months ago, her dizziness was under control.  The patient returns today complaining that her dizziness has recurred.  She has had 12 episodes of spinning vertigo over the past 6 months.  Her last episode was around [place].  She continues to have left ear hearing loss and tinnitus.  She also complains of intermittent headaches.  Each vertigo episode lasted for several hours.  She uses meclizine as needed.  Her primary care physician also started her on nortriptyline  recently.  She denies any recent change in her hearing.  She currently uses bilateral hearing aids.  Exam: General: Communicates without difficulty, well nourished, no acute distress. Head: Normocephalic, no evidence injury, no tenderness, facial buttresses intact without stepoff. Face/sinus: No tenderness to palpation and percussion. Facial movement is normal and symmetric. Eyes: PERRL, EOMI. No scleral icterus, conjunctivae clear. Neuro: CN II exam reveals vision grossly intact.  No nystagmus at any point of gaze. Ears: Auricles well formed without lesions.  Ear canals are intact without mass or lesion.  No erythema or edema is appreciated.  The TMs are intact without fluid. Nose: External evaluation reveals normal support and skin without lesions.  Dorsum is intact.  Anterior rhinoscopy reveals normal mucosa over anterior aspect of inferior turbinates and intact septum.  No purulence noted. Oral:  Oral cavity and  oropharynx are intact, symmetric, without erythema or edema.  Mucosa is moist without lesions. Neck: Full range of motion without pain.  There is no significant lymphadenopathy.  No masses palpable.  Thyroid  bed within normal limits to palpation.  Parotid glands and submandibular glands equal bilaterally without mass.  Trachea is midline. Neuro:  CN 2-12 grossly intact. Vestibular: No nystagmus at any point of gaze. Dix Hallpike negative. Vestibular: There is no nystagmus with pneumatic pressure on either tympanic membrane or Valsalva. The cerebellar examination is unremarkable.   Assessment: 1.  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 2.  Subjectively stable left ear hearing loss and left ear tinnitus. 3.  Her left aural pressure is likely a symptom of her left ear Mnire's disease. 4.  The patient's ear canals, tympanic membranes, and middle ear spaces are normal.   Plan: 1.  The physical exam findings are reviewed with the patient. 2.  The pathophysiology of dizziness and Mnire's disease are extensively discussed.  Questions are invited and answered. 3.  The treatment options for her Mnire's disease are extensively reviewed.  The options include conservative observation, low-salt diet, Dyazide diuretic, and surgical intervention with endolymphatic sac decompression.  The risk, benefits, and details of the treatment options are reviewed. 4.  The patient will continue with her 1500 mg low-salt diet. 5.  The patient will continue with nortriptyline  and meclizine as needed. 6.  The patient will return for reevaluation in 6 months, sooner if needed.

## 2023-12-10 ENCOUNTER — Other Ambulatory Visit (HOSPITAL_COMMUNITY): Payer: Self-pay | Admitting: Hematology

## 2023-12-10 DIAGNOSIS — R928 Other abnormal and inconclusive findings on diagnostic imaging of breast: Secondary | ICD-10-CM

## 2023-12-10 DIAGNOSIS — H9312 Tinnitus, left ear: Secondary | ICD-10-CM | POA: Insufficient documentation

## 2023-12-11 ENCOUNTER — Other Ambulatory Visit: Payer: Self-pay

## 2023-12-11 DIAGNOSIS — M961 Postlaminectomy syndrome, not elsewhere classified: Secondary | ICD-10-CM

## 2023-12-18 ENCOUNTER — Ambulatory Visit
Admission: RE | Admit: 2023-12-18 | Discharge: 2023-12-18 | Disposition: A | Discharge status: Home / Self Care | Source: Ambulatory Visit | Attending: Hematology | Admitting: Hematology

## 2023-12-18 DIAGNOSIS — R928 Other abnormal and inconclusive findings on diagnostic imaging of breast: Secondary | ICD-10-CM | POA: Diagnosis not present

## 2023-12-18 DIAGNOSIS — D241 Benign neoplasm of right breast: Secondary | ICD-10-CM | POA: Diagnosis not present

## 2023-12-18 DIAGNOSIS — N6312 Unspecified lump in the right breast, upper inner quadrant: Secondary | ICD-10-CM | POA: Diagnosis not present

## 2023-12-18 DIAGNOSIS — N6011 Diffuse cystic mastopathy of right breast: Secondary | ICD-10-CM | POA: Diagnosis not present

## 2023-12-18 DIAGNOSIS — Z853 Personal history of malignant neoplasm of breast: Secondary | ICD-10-CM | POA: Diagnosis not present

## 2023-12-18 DIAGNOSIS — N6021 Fibroadenosis of right breast: Secondary | ICD-10-CM | POA: Diagnosis not present

## 2023-12-18 HISTORY — PX: BREAST BIOPSY: SHX20

## 2023-12-19 LAB — SURGICAL PATHOLOGY

## 2023-12-23 DIAGNOSIS — Z131 Encounter for screening for diabetes mellitus: Secondary | ICD-10-CM | POA: Diagnosis not present

## 2023-12-23 DIAGNOSIS — M810 Age-related osteoporosis without current pathological fracture: Secondary | ICD-10-CM | POA: Diagnosis not present

## 2023-12-23 DIAGNOSIS — Z79899 Other long term (current) drug therapy: Secondary | ICD-10-CM | POA: Diagnosis not present

## 2023-12-23 DIAGNOSIS — G629 Polyneuropathy, unspecified: Secondary | ICD-10-CM | POA: Diagnosis not present

## 2023-12-23 DIAGNOSIS — Z0000 Encounter for general adult medical examination without abnormal findings: Secondary | ICD-10-CM | POA: Diagnosis not present

## 2023-12-29 ENCOUNTER — Other Ambulatory Visit: Payer: Self-pay | Admitting: General Surgery

## 2023-12-29 DIAGNOSIS — N6489 Other specified disorders of breast: Secondary | ICD-10-CM

## 2023-12-30 ENCOUNTER — Other Ambulatory Visit (HOSPITAL_COMMUNITY)

## 2023-12-30 ENCOUNTER — Encounter (HOSPITAL_COMMUNITY)

## 2023-12-31 ENCOUNTER — Other Ambulatory Visit: Payer: Self-pay | Admitting: General Surgery

## 2023-12-31 DIAGNOSIS — N6489 Other specified disorders of breast: Secondary | ICD-10-CM

## 2024-01-13 ENCOUNTER — Other Ambulatory Visit: Payer: Self-pay

## 2024-01-13 ENCOUNTER — Encounter (HOSPITAL_BASED_OUTPATIENT_CLINIC_OR_DEPARTMENT_OTHER): Payer: Self-pay | Admitting: General Surgery

## 2024-01-16 ENCOUNTER — Ambulatory Visit
Admission: RE | Admit: 2024-01-16 | Discharge: 2024-01-16 | Disposition: A | Discharge status: Home / Self Care | Source: Ambulatory Visit | Attending: General Surgery | Admitting: General Surgery

## 2024-01-16 DIAGNOSIS — N6489 Other specified disorders of breast: Secondary | ICD-10-CM

## 2024-01-16 DIAGNOSIS — N6021 Fibroadenosis of right breast: Secondary | ICD-10-CM | POA: Diagnosis not present

## 2024-01-16 HISTORY — PX: BREAST BIOPSY: SHX20

## 2024-01-16 NOTE — Progress Notes (Signed)
 Ensure presurgery (drink at 0530 on DOS) and chg soap given by Joni Net, RN. Verbal and written instruction given to husband who verbalized understanding.         Enhanced Recovery after Surgery for Orthopedics Enhanced Recovery after Surgery is a protocol used to improve the stress on your body and your recovery after surgery.  Patient Instructions  The night before surgery:  No food after midnight. ONLY clear liquids after midnight  The day of surgery (if you do NOT have diabetes):  Drink ONE (1) Pre-Surgery Clear Ensure as directed.   This drink was given to you during your hospital  pre-op appointment visit. The pre-op nurse will instruct you on the time to drink the  Pre-Surgery Ensure depending on your surgery time. Finish the drink at the designated time by the pre-op nurse.  Nothing else to drink after completing the  Pre-Surgery Clear Ensure.  The day of surgery (if you have diabetes): Drink ONE (1) Gatorade 2 (G2) as directed. This drink was given to you during your hospital  pre-op appointment visit.  The pre-op nurse will instruct you on the time to drink the   Gatorade 2 (G2) depending on your surgery time. Color of the Gatorade may vary. Red is not allowed. Nothing else to drink after completing the  Gatorade 2 (G2).         If you have questions, please contact your surgeon's office.

## 2024-01-19 NOTE — Anesthesia Preprocedure Evaluation (Signed)
 Anesthesia Evaluation  Patient identified by MRN, date of birth, ID band Patient awake    Reviewed: Allergy & Precautions, NPO status , Patient's Chart, lab work & pertinent test results  History of Anesthesia Complications (+) PONV and history of anesthetic complications  Airway Mallampati: II  TM Distance: >3 FB Neck ROM: Full    Dental  (+) Dental Advisory Given,    Pulmonary neg pulmonary ROS   Pulmonary exam normal breath sounds clear to auscultation       Cardiovascular (-) hypertension(-) angina (-) Past MI, (-) Cardiac Stents and (-) CABG (-) dysrhythmias + Valvular Problems/Murmurs MVP  Rhythm:Regular Rate:Normal  HLD   Neuro/Psych neg Seizures 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  Neuromuscular disease (RSD, neuropathy)    GI/Hepatic hiatal hernia,GERD (Barrett's esophagus)  Medicated,,(+) Hepatitis - (s/p interferon treatment), CDiverticulosis    Endo/Other  negative endocrine ROS    Renal/GU negative Renal ROS     Musculoskeletal  (+) Arthritis ,  Scoliosis, osteoporosis    Abdominal   Peds  Hematology negative hematology ROS (+) Lab Results      Component                Value               Date                      WBC                      5.4                 11/27/2023                HGB                      14.9                11/27/2023                HCT                      48.7 (H)            11/27/2023                MCV                      90.9                11/27/2023                PLT                      232                 11/27/2023              Anesthesia Other Findings   Reproductive/Obstetrics H/o left breast cancer 2015, radial scar of right breast                             Anesthesia Physical Anesthesia Plan  ASA: 3  Anesthesia Plan: General   Post-op Pain Management: Tylenol  PO (pre-op)*   Induction: Intravenous  PONV Risk Score and Plan: 4 or  greater and Ondansetron , Dexamethasone , Propofol  infusion, TIVA, Midazolam  and Treatment may vary due to age  or medical condition  Airway Management Planned: LMA  Additional Equipment:   Intra-op Plan:   Post-operative Plan: Extubation in OR  Informed Consent: I have reviewed the patients History and Physical, chart, labs and discussed the procedure including the risks, benefits and alternatives for the proposed anesthesia with the patient or authorized representative who has indicated his/her understanding and acceptance.     Dental advisory given  Plan Discussed with: CRNA and Anesthesiologist  Anesthesia Plan Comments: (Risks of general anesthesia discussed including, but not limited to, sore throat, hoarse voice, chipped/damaged teeth, injury to vocal cords, nausea and vomiting, allergic reactions, lung infection, heart attack, stroke, and death. All questions answered. )       Anesthesia Quick Evaluation

## 2024-01-20 ENCOUNTER — Other Ambulatory Visit: Payer: Self-pay

## 2024-01-20 ENCOUNTER — Ambulatory Visit (HOSPITAL_BASED_OUTPATIENT_CLINIC_OR_DEPARTMENT_OTHER)
Admission: RE | Admit: 2024-01-20 | Discharge: 2024-01-20 | Disposition: A | Discharge status: Home / Self Care | Attending: General Surgery | Admitting: General Surgery

## 2024-01-20 ENCOUNTER — Ambulatory Visit (HOSPITAL_BASED_OUTPATIENT_CLINIC_OR_DEPARTMENT_OTHER): Payer: Self-pay | Admitting: Anesthesiology

## 2024-01-20 ENCOUNTER — Encounter (HOSPITAL_BASED_OUTPATIENT_CLINIC_OR_DEPARTMENT_OTHER)
Admission: RE | Disposition: A | Discharge status: Home / Self Care | Payer: Self-pay | Source: Home / Self Care | Attending: General Surgery

## 2024-01-20 ENCOUNTER — Encounter (HOSPITAL_BASED_OUTPATIENT_CLINIC_OR_DEPARTMENT_OTHER): Payer: Self-pay | Admitting: General Surgery

## 2024-01-20 ENCOUNTER — Ambulatory Visit
Admission: RE | Admit: 2024-01-20 | Discharge: 2024-01-20 | Disposition: A | Discharge status: Home / Self Care | Source: Ambulatory Visit | Attending: General Surgery | Admitting: General Surgery

## 2024-01-20 DIAGNOSIS — Z8619 Personal history of other infectious and parasitic diseases: Secondary | ICD-10-CM | POA: Diagnosis not present

## 2024-01-20 DIAGNOSIS — Z9889 Other specified postprocedural states: Secondary | ICD-10-CM | POA: Diagnosis not present

## 2024-01-20 DIAGNOSIS — Z923 Personal history of irradiation: Secondary | ICD-10-CM | POA: Diagnosis not present

## 2024-01-20 DIAGNOSIS — N6489 Other specified disorders of breast: Secondary | ICD-10-CM | POA: Insufficient documentation

## 2024-01-20 DIAGNOSIS — G9050 Complex regional pain syndrome I, unspecified: Secondary | ICD-10-CM | POA: Insufficient documentation

## 2024-01-20 DIAGNOSIS — Z86000 Personal history of in-situ neoplasm of breast: Secondary | ICD-10-CM | POA: Diagnosis not present

## 2024-01-20 DIAGNOSIS — K449 Diaphragmatic hernia without obstruction or gangrene: Secondary | ICD-10-CM | POA: Insufficient documentation

## 2024-01-20 DIAGNOSIS — N62 Hypertrophy of breast: Secondary | ICD-10-CM | POA: Diagnosis not present

## 2024-01-20 DIAGNOSIS — M810 Age-related osteoporosis without current pathological fracture: Secondary | ICD-10-CM | POA: Insufficient documentation

## 2024-01-20 DIAGNOSIS — M1990 Unspecified osteoarthritis, unspecified site: Secondary | ICD-10-CM | POA: Insufficient documentation

## 2024-01-20 DIAGNOSIS — N6021 Fibroadenosis of right breast: Secondary | ICD-10-CM | POA: Insufficient documentation

## 2024-01-20 DIAGNOSIS — K219 Gastro-esophageal reflux disease without esophagitis: Secondary | ICD-10-CM | POA: Insufficient documentation

## 2024-01-20 DIAGNOSIS — Z01818 Encounter for other preprocedural examination: Secondary | ICD-10-CM

## 2024-01-20 DIAGNOSIS — E785 Hyperlipidemia, unspecified: Secondary | ICD-10-CM | POA: Diagnosis not present

## 2024-01-20 DIAGNOSIS — N6011 Diffuse cystic mastopathy of right breast: Secondary | ICD-10-CM | POA: Diagnosis not present

## 2024-01-20 DIAGNOSIS — R921 Mammographic calcification found on diagnostic imaging of breast: Secondary | ICD-10-CM | POA: Diagnosis not present

## 2024-01-20 HISTORY — PX: EXCISION OF BREAST BIOPSY: SHX5822

## 2024-01-20 SURGERY — EXCISION OF BREAST BIOPSY
Anesthesia: General | Site: Breast | Laterality: Right

## 2024-01-20 MED ORDER — PROPOFOL 500 MG/50ML IV EMUL
INTRAVENOUS | Status: DC | PRN
Start: 2024-01-20 — End: 2024-01-20
  Administered 2024-01-20: 150 ug/kg/min via INTRAVENOUS

## 2024-01-20 MED ORDER — CEFAZOLIN SODIUM-DEXTROSE 2-4 GM/100ML-% IV SOLN
INTRAVENOUS | Status: AC
  Filled 2024-01-20: qty 100

## 2024-01-20 MED ORDER — ONDANSETRON HCL 4 MG/2ML IJ SOLN
INTRAMUSCULAR | Status: DC | PRN
  Administered 2024-01-20: 4 mg via INTRAVENOUS

## 2024-01-20 MED ORDER — ONDANSETRON HCL 4 MG/2ML IJ SOLN
INTRAMUSCULAR | Status: AC
  Filled 2024-01-20: qty 2

## 2024-01-20 MED ORDER — CEFAZOLIN SODIUM-DEXTROSE 2-4 GM/100ML-% IV SOLN
2.0000 g | INTRAVENOUS | Status: AC
  Administered 2024-01-20: 2 g via INTRAVENOUS

## 2024-01-20 MED ORDER — CHLORHEXIDINE GLUCONATE CLOTH 2 % EX PADS: 6.0000 | MEDICATED_PAD | Freq: Once | CUTANEOUS | Status: DC

## 2024-01-20 MED ORDER — ACETAMINOPHEN 500 MG PO TABS: 1000.0000 mg | ORAL_TABLET | ORAL | Status: DC

## 2024-01-20 MED ORDER — BUPIVACAINE HCL (PF) 0.25 % IJ SOLN
INTRAMUSCULAR | Status: DC | PRN
  Administered 2024-01-20: 9 mL

## 2024-01-20 MED ORDER — PROPOFOL 10 MG/ML IV BOLUS
INTRAVENOUS | Status: AC
Start: 2024-01-20 — End: 2024-01-20
  Filled 2024-01-20: qty 20

## 2024-01-20 MED ORDER — FENTANYL CITRATE (PF) 100 MCG/2ML IJ SOLN
INTRAMUSCULAR | Status: DC | PRN
  Administered 2024-01-20: 25 ug via INTRAVENOUS

## 2024-01-20 MED ORDER — PHENYLEPHRINE 80 MCG/ML (10ML) SYRINGE FOR IV PUSH (FOR BLOOD PRESSURE SUPPORT)
PREFILLED_SYRINGE | INTRAVENOUS | Status: AC
  Filled 2024-01-20: qty 10

## 2024-01-20 MED ORDER — LACTATED RINGERS IV SOLN: INTRAVENOUS | Status: DC

## 2024-01-20 MED ORDER — PROPOFOL 10 MG/ML IV BOLUS
INTRAVENOUS | Status: DC | PRN
  Administered 2024-01-20: 50 mg via INTRAVENOUS
  Administered 2024-01-20: 150 mg via INTRAVENOUS

## 2024-01-20 MED ORDER — OXYCODONE HCL 5 MG/5ML PO SOLN: 5.0000 mg | Freq: Once | ORAL | Status: DC | PRN

## 2024-01-20 MED ORDER — LIDOCAINE 2% (20 MG/ML) 5 ML SYRINGE
INTRAMUSCULAR | Status: AC
  Filled 2024-01-20: qty 5

## 2024-01-20 MED ORDER — OXYCODONE HCL 5 MG PO TABS: 5.0000 mg | ORAL_TABLET | Freq: Once | ORAL | Status: DC | PRN

## 2024-01-20 MED ORDER — ACETAMINOPHEN 500 MG PO TABS
ORAL_TABLET | ORAL | Status: AC
  Filled 2024-01-20: qty 2

## 2024-01-20 MED ORDER — FENTANYL CITRATE (PF) 100 MCG/2ML IJ SOLN: 25.0000 ug | INTRAMUSCULAR | Status: DC | PRN

## 2024-01-20 MED ORDER — DEXAMETHASONE SODIUM PHOSPHATE 10 MG/ML IJ SOLN
INTRAMUSCULAR | Status: DC | PRN
  Administered 2024-01-20: 5 mg via INTRAVENOUS

## 2024-01-20 MED ORDER — PHENYLEPHRINE HCL (PRESSORS) 10 MG/ML IV SOLN
INTRAVENOUS | Status: DC | PRN
Start: 2024-01-20 — End: 2024-01-20
  Administered 2024-01-20 (×4): 80 ug via INTRAVENOUS

## 2024-01-20 MED ORDER — FENTANYL CITRATE (PF) 100 MCG/2ML IJ SOLN
INTRAMUSCULAR | Status: AC
  Filled 2024-01-20: qty 2

## 2024-01-20 MED ORDER — DEXAMETHASONE SODIUM PHOSPHATE 10 MG/ML IJ SOLN
INTRAMUSCULAR | Status: AC
Start: 2024-01-20 — End: 2024-01-20
  Filled 2024-01-20: qty 1

## 2024-01-20 MED ORDER — AMISULPRIDE (ANTIEMETIC) 5 MG/2ML IV SOLN: 10.0000 mg | Freq: Once | INTRAVENOUS | Status: DC | PRN

## 2024-01-20 MED ORDER — LIDOCAINE HCL (CARDIAC) PF 100 MG/5ML IV SOSY
PREFILLED_SYRINGE | INTRAVENOUS | Status: DC | PRN
  Administered 2024-01-20: 80 mg via INTRAVENOUS

## 2024-01-20 MED ORDER — MIDAZOLAM HCL 2 MG/2ML IJ SOLN
INTRAMUSCULAR | Status: AC
  Filled 2024-01-20: qty 2

## 2024-01-20 MED ORDER — ENSURE PRE-SURGERY PO LIQD: 296.0000 mL | Freq: Once | ORAL | Status: DC

## 2024-01-20 SURGICAL SUPPLY — 45 items
BINDER BREAST LRG (GAUZE/BANDAGES/DRESSINGS) IMPLANT
BINDER BREAST MEDIUM (GAUZE/BANDAGES/DRESSINGS) IMPLANT
BINDER BREAST XLRG (GAUZE/BANDAGES/DRESSINGS) IMPLANT
BINDER BREAST XXLRG (GAUZE/BANDAGES/DRESSINGS) IMPLANT
BLADE SURG 15 STRL LF DISP TIS (BLADE) ×1 IMPLANT
CANISTER SUC SOCK COL 7IN (MISCELLANEOUS) IMPLANT
CANISTER SUCT 1200ML W/VALVE (MISCELLANEOUS) IMPLANT
CHLORAPREP W/TINT 26 (MISCELLANEOUS) ×1 IMPLANT
CLIP APPLIE 9.375 MED OPEN (MISCELLANEOUS) IMPLANT
CLIP TI WIDE RED SMALL 6 (CLIP) IMPLANT
COVER BACK TABLE 60X90IN (DRAPES) ×1 IMPLANT
COVER MAYO STAND STRL (DRAPES) ×1 IMPLANT
COVER PROBE CYLINDRICAL 5X96 (MISCELLANEOUS) ×1 IMPLANT
DERMABOND ADVANCED .7 DNX12 (GAUZE/BANDAGES/DRESSINGS) ×1 IMPLANT
DRAPE LAPAROSCOPIC ABDOMINAL (DRAPES) ×1 IMPLANT
DRAPE UTILITY XL STRL (DRAPES) ×1 IMPLANT
DRSG TEGADERM 4X4.75 (GAUZE/BANDAGES/DRESSINGS) IMPLANT
ELECT COATED BLADE 2.86 ST (ELECTRODE) ×1 IMPLANT
ELECTRODE REM PT RTRN 9FT ADLT (ELECTROSURGICAL) ×1 IMPLANT
GAUZE SPONGE 4X4 12PLY STRL LF (GAUZE/BANDAGES/DRESSINGS) IMPLANT
GLOVE BIO SURGEON STRL SZ7 (GLOVE) ×2 IMPLANT
GLOVE BIOGEL PI IND STRL 7.5 (GLOVE) ×1 IMPLANT
GOWN STRL REUS W/ TWL LRG LVL3 (GOWN DISPOSABLE) ×2 IMPLANT
HEMOSTAT ARISTA ABSORB 3G PWDR (HEMOSTASIS) IMPLANT
KIT MARKER MARGIN INK (KITS) ×1 IMPLANT
NDL HYPO 25X1 1.5 SAFETY (NEEDLE) ×1 IMPLANT
NEEDLE HYPO 25X1 1.5 SAFETY (NEEDLE) ×1 IMPLANT
NS IRRIG 1000ML POUR BTL (IV SOLUTION) IMPLANT
PACK BASIN DAY SURGERY FS (CUSTOM PROCEDURE TRAY) ×1 IMPLANT
PENCIL SMOKE EVACUATOR (MISCELLANEOUS) ×1 IMPLANT
RETRACTOR ONETRAX LX 90X20 (MISCELLANEOUS) IMPLANT
SLEEVE SCD COMPRESS KNEE MED (STOCKING) ×1 IMPLANT
SPIKE FLUID TRANSFER (MISCELLANEOUS) IMPLANT
SPONGE T-LAP 4X18 ~~LOC~~+RFID (SPONGE) ×1 IMPLANT
STRIP CLOSURE SKIN 1/2X4 (GAUZE/BANDAGES/DRESSINGS) ×1 IMPLANT
SUT MNCRL AB 4-0 PS2 18 (SUTURE) IMPLANT
SUT MON AB 5-0 PS2 18 (SUTURE) IMPLANT
SUT SILK 2 0 SH (SUTURE) IMPLANT
SUT VIC AB 2-0 SH 27XBRD (SUTURE) ×1 IMPLANT
SUT VIC AB 3-0 SH 27X BRD (SUTURE) ×1 IMPLANT
SYR CONTROL 10ML LL (SYRINGE) ×1 IMPLANT
TOWEL GREEN STERILE FF (TOWEL DISPOSABLE) ×1 IMPLANT
TRAY FAXITRON CT DISP (TRAY / TRAY PROCEDURE) ×1 IMPLANT
TUBE CONNECTING 20X1/4 (TUBING) IMPLANT
YANKAUER SUCT BULB TIP NO VENT (SUCTIONS) IMPLANT

## 2024-01-20 NOTE — Anesthesia Postprocedure Evaluation (Signed)
 Anesthesia Post Note  Patient: Amanda Terrell  Procedure(s) Performed: EXCISION OF BREAST BIOPSY WITH RADIOACTIVE SEED (Right: Breast)     Patient location during evaluation: PACU Anesthesia Type: General Level of consciousness: awake Pain management: pain level controlled Vital Signs Assessment: post-procedure vital signs reviewed and stable Respiratory status: spontaneous breathing, nonlabored ventilation and respiratory function stable Cardiovascular status: blood pressure returned to baseline and stable Postop Assessment: no apparent nausea or vomiting Anesthetic complications: no   No notable events documented.  Last Vitals:  Vitals:   01/20/24 1030 01/20/24 1057  BP: 130/87 121/74  Pulse: 71 84  Resp: 20 15  Temp:  36.4 C  SpO2: 94% 92%    Last Pain:  Vitals:   01/20/24 1057  TempSrc: Temporal  PainSc: 0-No pain                 Delon Aisha Arch

## 2024-01-20 NOTE — Anesthesia Procedure Notes (Signed)
 Procedure Name: LMA Insertion Date/Time: 01/20/2024 9:24 AM  Performed by: Debarah Chiquita LABOR, CRNAPre-anesthesia Checklist: Patient identified, Emergency Drugs available, Suction available and Patient being monitored Patient Re-evaluated:Patient Re-evaluated prior to induction Oxygen Delivery Method: Circle system utilized Preoxygenation: Pre-oxygenation with 100% oxygen Induction Type: IV induction Ventilation: Mask ventilation without difficulty LMA: LMA inserted LMA Size: 4.0 Number of attempts: 1 Airway Equipment and Method: Bite block Placement Confirmation: positive ETCO2 Tube secured with: Tape Dental Injury: Teeth and Oropharynx as per pre-operative assessment

## 2024-01-20 NOTE — Interval H&P Note (Signed)
 History and Physical Interval Note:  01/20/2024 8:46 AM  Amanda Terrell  has presented today for surgery, with the diagnosis of RIGHT BREAST MASS.  The various methods of treatment have been discussed with the patient and family. After consideration of risks, benefits and other options for treatment, the patient has consented to  Procedure(s) with comments: EXCISION OF BREAST BIOPSY WITH RADIOACTIVE SEED (Right) - LMA RIGHT BREAST SEED GUIDED EXCISIONAL BIOPSY as a surgical intervention.  The patient's history has been reviewed, patient examined, no change in status, stable for surgery.  I have reviewed the patient's chart and labs.  Questions were answered to the patient's satisfaction.     Donnice Bury

## 2024-01-20 NOTE — Discharge Instructions (Addendum)
[facility] Office Phone Number [phone]  POST OP INSTRUCTIONS Take 400 mg of ibuprofen every 8 hours or 650 mg tylenol every 6 hours for next 72 hours then as needed. Use ice several times daily also.  A prescription for pain medication may be given to you upon discharge.  Take your pain medication as prescribed, if needed.  If narcotic pain medicine is not needed, then you may take acetaminophen (Tylenol), naprosyn (Alleve) or ibuprofen (Advil) as needed. Take your usually prescribed medications unless otherwise directed If you need a refill on your pain medication, please contact your pharmacy.  They will contact our office to request authorization.  Prescriptions will not be filled after 5pm or on week-ends. You should eat very light the first 24 hours after surgery, such as soup, crackers, pudding, etc.  Resume your normal diet the day after surgery. Most patients will experience some swelling and bruising in the breast.  Ice packs and a good support bra will help.  Wear the breast binder provided or a sports bra for 72 hours day and night.  After that wear a sports bra during the day until you return to the office. Swelling and bruising can take several days to resolve.  It is common to experience some constipation if taking pain medication after surgery.  Increasing fluid intake and taking a stool softener will usually help or prevent this problem from occurring.  A mild laxative (Milk of Magnesia or Miralax) should be taken according to package directions if there are no bowel movements after 48 hours. I used skin glue on the incision, you may shower in 24 hours.  The glue will flake off over the next 2-3 weeks.  Any sutures or staples will be removed at the office during your follow-up visit. ACTIVITIES:  You may resume regular daily activities (gradually increasing) beginning the next day.  Wearing a good support bra or sports bra minimizes pain and swelling.  You may have  sexual intercourse when it is comfortable. You may drive when you no longer are taking prescription pain medication, you can comfortably wear a seatbelt, and you can safely maneuver your car and apply brakes. RETURN TO WORK:  ______________________________________________________________________________________ You should see your doctor in the office for a follow-up appointment approximately two weeks after your surgery.  Your doctor's nurse will typically make your follow-up appointment when she calls you with your pathology report.  Expect your pathology report 3-4 business days after your surgery.  You may call to check if you do not hear from us after three days. OTHER INSTRUCTIONS: _______________________________________________________________________________________________ _____________________________________________________________________________________________________________________________________ _____________________________________________________________________________________________________________________________________ _____________________________________________________________________________________________________________________________________  WHEN TO CALL DR WAKEFIELD: Fever over 101.0 Nausea and/or vomiting. Extreme swelling or bruising. Continued bleeding from incision. Increased pain, redness, or drainage from the incision.  The clinic staff is available to answer your questions during regular business hours.  Please don't hesitate to call and ask to speak to one of the nurses for clinical concerns.  If you have a medical emergency, go to the nearest emergency room or call 911.  A surgeon from [facility] is always on call at the hospital.  For further questions, please visit [email or web address]      Post Anesthesia Home Care Instructions  Activity: Get plenty of rest for the remainder of the day. A responsible individual must  stay with you for 24 hours following the procedure.  For the next 24 hours, DO NOT: -Drive a car -Operate machinery -Drink alcoholic beverages -Take any medication   unless instructed by your physician -Make any legal decisions or sign important papers.  Meals: Start with liquid foods such as gelatin or soup. Progress to regular foods as tolerated. Avoid greasy, spicy, heavy foods. If nausea and/or vomiting occur, drink only clear liquids until the nausea and/or vomiting subsides. Call your physician if vomiting continues.  Special Instructions/Symptoms: Your throat may feel dry or sore from the anesthesia or the breathing tube placed in your throat during surgery. If this causes discomfort, gargle with warm salt water. The discomfort should disappear within 24 hours.  If you had a scopolamine patch placed behind your ear for the management of post- operative nausea and/or vomiting:  1. The medication in the patch is effective for 72 hours, after which it should be removed.  Wrap patch in a tissue and discard in the trash. Wash hands thoroughly with soap and water. 2. You may remove the patch earlier than 72 hours if you experience unpleasant side effects which may include dry mouth, dizziness or visual disturbances. 3. Avoid touching the patch. Wash your hands with soap and water after contact with the patch.    

## 2024-01-20 NOTE — Transfer of Care (Signed)
 Immediate Anesthesia Transfer of Care Note  Patient: Amanda Terrell  Procedure(s) Performed: EXCISION OF BREAST BIOPSY WITH RADIOACTIVE SEED (Right: Breast)  Patient Location: PACU  Anesthesia Type:General  Level of Consciousness: drowsy  Airway & Oxygen Therapy: Patient Spontanous Breathing and Patient connected to face mask oxygen  Post-op Assessment: Report given to RN and Post -op Vital signs reviewed and stable  Post vital signs: Reviewed and stable  Last Vitals:  Vitals Value Taken Time  BP 114/69 01/20/24 10:01  Temp    Pulse 77 01/20/24 10:02  Resp 17 01/20/24 10:02  SpO2 100 % 01/20/24 10:02  Vitals shown include unfiled device data.  Last Pain:  Vitals:   01/20/24 0735  TempSrc: Temporal  PainSc: 7       Patients Stated Pain Goal: 7 (01/20/24 0735)  Complications: No notable events documented.

## 2024-01-20 NOTE — Op Note (Signed)
 Preoperative diagnosis: Right breast distortion Postoperative diagnosis: Same as above Procedure: Right breast radioactive seed guided excisional biopsy Surgeon: Dr. 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  She had a recent screening mammogram that shows a right sided distortion.  This is near where she had an old fibroadenoma excised.  Biopsy shows a radial scar and we discussed all of her options.  She wanted to proceed with excision.  Procedure: After informed consent was obtained she was taken to the operating room.  She had a radioactive seed placed prior to beginning I had these mammograms available for my review in the operating room.  Antibiotics were given.  SCDs were in place.  She was placed under general anesthesia without complication.  She was prepped and draped in standard sterile surgical fashion.  Surgical timeout was then performed.  This was fairly close to the skin and I made a curvilinear incision overlying the mass in the upper breast.  Infiltrated Marcaine  first.  I then used the neoprobe to remove the seed and some of the surrounding tissue.  I did a mammogram which confirmed removal of the seed and the X clip.  Hemostasis was obtained.  I closed the breast tissue with 2-0 Vicryl.  The skin was closed with 3-0 Vicryl for Monocryl.  Glue and Steri-Strips were applied.  She tolerated this well was extubated and transferred recovery stable.

## 2024-01-20 NOTE — H&P (Signed)
 68 year old female is a prior history of a left lumpectomy for DCIS in 2016. She had then underwent radiotherapy. She did not tolerate antiestrogens stop these after a week. She has done well since then. She had a screening mammogram that shows C density breast tissue. There is a right sided distortion. This is near where she had an old fibroadenoma excised. Biopsy of this is done and this shows this to be radial scar as well as a number of other findings. She is here today to discuss her options.  Review of Systems: A complete review of systems was obtained from the patient. I have reviewed this information and discussed as appropriate with the patient. See HPI as well for other ROS.  Review of Systems  HENT: Positive for hearing loss.  All other systems reviewed and are negative.  Medical History: Past Medical History:  Diagnosis Date  Breast cancer (CMS/HHS-HCC)  GERD (gastroesophageal reflux disease)  Hepatitis  Osteoarthritis   Patient Active Problem List  Diagnosis  Chronic right-sided low back pain with right-sided sciatica  Adolescent idiopathic scoliosis of thoracolumbar region  Claustrophobia  Radial scar of breast   Past Surgical History:  Procedure Laterality Date  Breast lumpectomy with radioactive seed localization Left 08/05/2014  Dr Gail  OSTECTOMY CALCANEUS NED FASCIA RELEASE Right 09/08/2019  PERCUTANEOUS BIOPSY BREAST W/NEEDLE LOCALIZATION Right 12/18/2023  APPENDECTOMY  BACK SURGERY  Breast fibroadenoma surgery Bilateral  CATARACT EXTRACTION  CESAREAN SECTION   Allergies  Allergen Reactions  Acetaminophen  Liver Disorder  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  ibuprofen  (MOTRIN) 800 MG tablet Take 800 mg by mouth every 6 (six) hours as needed for Pain  multivitamin (MULTIVITAMIN) tablet Take by mouth.  calcium carbonate-vitamin D3 1,000 mg(2,500 mg)-800 unit Tab Take by mouth.  cholecalciferol  (CHOLECALCIFEROL ) 1,000 unit tablet Take by mouth.  HYDROmorphone  (DILAUDID ) 4 MG tablet Take by mouth.  omeprazole  (PRILOSEC) 20 MG DR capsule Take by mouth.   Family History  Problem Relation Age of Onset  Coronary Artery Disease (Blocked arteries around heart) Mother  Heart disease Mother    Social History   Tobacco Use  Smoking Status Never  Smokeless Tobacco Never  Marital status: Married  Tobacco Use  Smoking status: Never  Smokeless tobacco: Never  Substance and Sexual Activity  Alcohol use: Yes  Drug use: Never   Objective:   Vitals:  12/29/23 0931  BP: 126/78  Pulse: 82  Temp: 36.8 C (98.2 F)  Weight: 66.2 kg (146 lb)  Height: 163.8 cm (5' 4.5)  PainSc: 0-No pain  PainLoc: Breast   Body mass index is 24.67 kg/m.  Physical Exam Vitals reviewed.  Constitutional:  Appearance: Normal appearance.  Chest:  Breasts: Right: No inverted nipple, mass or nipple discharge.  Lymphadenopathy:  Upper Body:  Right upper body: No axillary adenopathy.  Neurological:  Mental Status: She is alert.   Assessment and Plan:   Radial scar of breast  We discussed observation versus excision. I think with her history that it is reasonable to consider excision. We discussed the pros and cons of both of those approaches and she would like to proceed with excision. We discussed a radioactive seed guided excisional biopsy with the risks and benefits associated  with that.

## 2024-01-21 ENCOUNTER — Encounter (HOSPITAL_BASED_OUTPATIENT_CLINIC_OR_DEPARTMENT_OTHER): Payer: Self-pay | Admitting: General Surgery

## 2024-01-22 LAB — SURGICAL PATHOLOGY

## 2024-02-05 DIAGNOSIS — M4805 Spinal stenosis, thoracolumbar region: Secondary | ICD-10-CM | POA: Diagnosis not present

## 2024-02-05 DIAGNOSIS — M4155 Other secondary scoliosis, thoracolumbar region: Secondary | ICD-10-CM | POA: Diagnosis not present

## 2024-02-05 DIAGNOSIS — Z981 Arthrodesis status: Secondary | ICD-10-CM | POA: Diagnosis not present

## 2024-02-05 DIAGNOSIS — M960 Pseudarthrosis after fusion or arthrodesis: Secondary | ICD-10-CM | POA: Diagnosis not present

## 2024-02-05 DIAGNOSIS — M4800 Spinal stenosis, site unspecified: Secondary | ICD-10-CM | POA: Diagnosis not present

## 2024-02-05 DIAGNOSIS — M4015 Other secondary kyphosis, thoracolumbar region: Secondary | ICD-10-CM | POA: Diagnosis not present

## 2024-02-05 DIAGNOSIS — M2170 Unequal limb length (acquired), unspecified site: Secondary | ICD-10-CM | POA: Diagnosis not present

## 2024-02-05 DIAGNOSIS — M461 Sacroiliitis, not elsewhere classified: Secondary | ICD-10-CM | POA: Diagnosis not present

## 2024-03-16 ENCOUNTER — Ambulatory Visit: Admitting: Neurology

## 2024-03-16 DIAGNOSIS — R102 Pelvic and perineal pain: Secondary | ICD-10-CM | POA: Diagnosis not present

## 2024-03-16 DIAGNOSIS — M4155 Other secondary scoliosis, thoracolumbar region: Secondary | ICD-10-CM | POA: Diagnosis not present

## 2024-03-16 DIAGNOSIS — G039 Meningitis, unspecified: Secondary | ICD-10-CM | POA: Diagnosis not present

## 2024-03-16 DIAGNOSIS — M4712 Other spondylosis with myelopathy, cervical region: Secondary | ICD-10-CM | POA: Diagnosis not present

## 2024-03-16 DIAGNOSIS — M4015 Other secondary kyphosis, thoracolumbar region: Secondary | ICD-10-CM | POA: Diagnosis not present

## 2024-03-16 DIAGNOSIS — Z981 Arthrodesis status: Secondary | ICD-10-CM | POA: Diagnosis not present

## 2024-03-16 DIAGNOSIS — M960 Pseudarthrosis after fusion or arthrodesis: Secondary | ICD-10-CM | POA: Diagnosis not present

## 2024-03-16 DIAGNOSIS — M4802 Spinal stenosis, cervical region: Secondary | ICD-10-CM | POA: Diagnosis not present

## 2024-03-16 DIAGNOSIS — M4805 Spinal stenosis, thoracolumbar region: Secondary | ICD-10-CM | POA: Diagnosis not present

## 2024-03-18 DIAGNOSIS — M21371 Foot drop, right foot: Secondary | ICD-10-CM | POA: Diagnosis not present

## 2024-03-18 DIAGNOSIS — M4325 Fusion of spine, thoracolumbar region: Secondary | ICD-10-CM | POA: Diagnosis not present

## 2024-03-23 DIAGNOSIS — H04123 Dry eye syndrome of bilateral lacrimal glands: Secondary | ICD-10-CM | POA: Diagnosis not present

## 2024-04-01 DIAGNOSIS — M419 Scoliosis, unspecified: Secondary | ICD-10-CM | POA: Diagnosis not present

## 2024-04-01 DIAGNOSIS — G96198 Other disorders of meninges, not elsewhere classified: Secondary | ICD-10-CM | POA: Diagnosis not present

## 2024-04-20 DIAGNOSIS — M5417 Radiculopathy, lumbosacral region: Secondary | ICD-10-CM | POA: Diagnosis not present

## 2024-04-26 DIAGNOSIS — M5417 Radiculopathy, lumbosacral region: Secondary | ICD-10-CM | POA: Diagnosis not present

## 2024-04-26 DIAGNOSIS — M960 Pseudarthrosis after fusion or arthrodesis: Secondary | ICD-10-CM | POA: Diagnosis not present

## 2024-04-26 DIAGNOSIS — Z981 Arthrodesis status: Secondary | ICD-10-CM | POA: Diagnosis not present

## 2024-04-26 DIAGNOSIS — M4805 Spinal stenosis, thoracolumbar region: Secondary | ICD-10-CM | POA: Diagnosis not present

## 2024-06-04 ENCOUNTER — Other Ambulatory Visit: Payer: Self-pay

## 2024-06-04 DIAGNOSIS — M810 Age-related osteoporosis without current pathological fracture: Secondary | ICD-10-CM

## 2024-06-04 DIAGNOSIS — C50412 Malignant neoplasm of upper-outer quadrant of left female breast: Secondary | ICD-10-CM

## 2024-06-07 ENCOUNTER — Encounter (INDEPENDENT_AMBULATORY_CARE_PROVIDER_SITE_OTHER): Payer: Self-pay | Admitting: Otolaryngology

## 2024-06-07 ENCOUNTER — Inpatient Hospital Stay

## 2024-06-07 ENCOUNTER — Telehealth: Payer: Self-pay | Admitting: Oncology

## 2024-06-07 ENCOUNTER — Ambulatory Visit (INDEPENDENT_AMBULATORY_CARE_PROVIDER_SITE_OTHER): Admitting: Otolaryngology

## 2024-06-07 VITALS — BP 116/73 | HR 84 | Temp 97.6°F | Ht 65.0 in | Wt 142.0 lb

## 2024-06-07 DIAGNOSIS — R42 Dizziness and giddiness: Secondary | ICD-10-CM

## 2024-06-07 DIAGNOSIS — H9042 Sensorineural hearing loss, unilateral, left ear, with unrestricted hearing on the contralateral side: Secondary | ICD-10-CM

## 2024-06-07 DIAGNOSIS — H9312 Tinnitus, left ear: Secondary | ICD-10-CM

## 2024-06-07 DIAGNOSIS — H8102 Meniere's disease, left ear: Secondary | ICD-10-CM | POA: Diagnosis not present

## 2024-06-07 DIAGNOSIS — H9192 Unspecified hearing loss, left ear: Secondary | ICD-10-CM | POA: Diagnosis not present

## 2024-06-07 NOTE — Progress Notes (Signed)
 Patient ID: Amanda Terrell, female   DOB: April 07, 1956, 68 y.o.   MRN: [id number]  Follow-up: Recurrent dizziness, left ear Mnire's disease, left ear hearing loss and tinnitus   HPI: The patient is a 68 year old female who returns today for her follow-up evaluation.  The patient has a history of recurrent dizziness, asymmetric left ear hearing loss, left ear tinnitus, and left aural pressure.  She was diagnosed with left ear Mnire's disease.  Her MRI scan was negative for retrocochlear lesion.  The patient was treated with a 1500 mg low-salt diet, meclizine, and nortriptyline .  The patient returns today reporting 1 episode of vertigo over the past 6 months.  She denies any recent change in her hearing or tinnitus.  She currently uses bilateral hearing aids.  She denies any otalgia or otorrhea.  Exam: General: Communicates without difficulty, well nourished, no acute distress. Head: Normocephalic, no evidence injury, no tenderness, facial buttresses intact without stepoff. Face/sinus: No tenderness to palpation and percussion. Facial movement is normal and symmetric. Eyes: PERRL, EOMI. No scleral icterus, conjunctivae clear. Neuro: CN II exam reveals vision grossly intact.  No nystagmus at any point of gaze. Ears: Auricles well formed without lesions.  Ear canals are intact without mass or lesion.  No erythema or edema is appreciated.  The TMs are intact without fluid. Nose: External evaluation reveals normal support and skin without lesions.  Dorsum is intact.  Anterior rhinoscopy reveals normal mucosa over anterior aspect of inferior turbinates and intact septum.  No purulence noted. Oral:  Oral cavity and oropharynx are intact, symmetric, without erythema or edema.  Mucosa is moist without lesions. Neck: Full range of motion without pain.  There is no significant lymphadenopathy.  No masses palpable.  Thyroid  bed within normal limits to palpation.  Parotid glands and submandibular glands equal  bilaterally without mass.  Trachea is midline. Neuro:  CN 2-12 grossly intact. Vestibular: No nystagmus at any point of gaze. Dix Hallpike negative. Vestibular: There is no nystagmus with pneumatic pressure on either tympanic membrane or Valsalva. The cerebellar examination is unremarkable.   Assessment: 1.  Her left ear Mnire's disease is currently under control.  She had only 1 episode of spinning vertigo over the past 6 months. 2.  Subjectively stable left ear hearing loss and left ear tinnitus. 3.  Her left aural pressure is likely a symptom of her left ear Mnire's disease. 4.  The patient's ear canals, tympanic membranes, and middle ear spaces are normal.   Plan: 1.  The physical exam findings are reviewed with the patient. 2.  The pathophysiology of dizziness and Mnire's disease are discussed.  Questions are invited and answered. 3.  The treatment options for her Mnire's disease are extensively reviewed.  The options include conservative observation, low-salt diet, Dyazide diuretic, and surgical intervention with endolymphatic sac decompression.  4.  The patient will continue with her 1500 mg low-salt diet. 5.  The patient will continue with nortriptyline  and meclizine as needed. 6.  The patient will return for reevaluation in 6 months, sooner if needed.

## 2024-06-09 ENCOUNTER — Ambulatory Visit (INDEPENDENT_AMBULATORY_CARE_PROVIDER_SITE_OTHER): Admitting: Otolaryngology

## 2024-06-09 ENCOUNTER — Encounter: Payer: Self-pay | Admitting: Neurology

## 2024-06-16 ENCOUNTER — Inpatient Hospital Stay

## 2024-06-16 ENCOUNTER — Inpatient Hospital Stay: Attending: Physician Assistant

## 2024-06-16 VITALS — BP 132/79 | HR 84 | Temp 97.8°F | Resp 18

## 2024-06-16 DIAGNOSIS — Z780 Asymptomatic menopausal state: Secondary | ICD-10-CM

## 2024-06-16 DIAGNOSIS — Z23 Encounter for immunization: Secondary | ICD-10-CM | POA: Diagnosis not present

## 2024-06-16 DIAGNOSIS — M810 Age-related osteoporosis without current pathological fracture: Secondary | ICD-10-CM

## 2024-06-16 DIAGNOSIS — C50412 Malignant neoplasm of upper-outer quadrant of left female breast: Secondary | ICD-10-CM | POA: Diagnosis not present

## 2024-06-16 DIAGNOSIS — Z170 Estrogen receptor positive status [ER+]: Secondary | ICD-10-CM | POA: Insufficient documentation

## 2024-06-16 DIAGNOSIS — Z79899 Other long term (current) drug therapy: Secondary | ICD-10-CM | POA: Diagnosis not present

## 2024-06-16 LAB — COMPREHENSIVE METABOLIC PANEL WITH GFR
ALT: 17 U/L (ref 0–44)
AST: 23 U/L (ref 15–41)
Albumin: 4.5 g/dL (ref 3.5–5.0)
Alkaline Phosphatase: 67 U/L (ref 38–126)
Anion gap: 9 (ref 5–15)
BUN: 12 mg/dL (ref 8–23)
CO2: 32 mmol/L (ref 22–32)
Calcium: 9.9 mg/dL (ref 8.9–10.3)
Chloride: 99 mmol/L (ref 98–111)
Creatinine, Ser: 0.6 mg/dL (ref 0.44–1.00)
GFR, Estimated: >60 mL/min (ref >60)
Glucose, Bld: 99 mg/dL (ref 70–99)
Potassium: 4.2 mmol/L (ref 3.5–5.1)
Sodium: 141 mmol/L (ref 135–145)
Total Bilirubin: 0.5 mg/dL (ref 0.0–1.2)
Total Protein: 7.1 g/dL (ref 6.5–8.1)

## 2024-06-16 MED ORDER — INFLUENZA VAC SPLIT HIGH-DOSE 0.5 ML IM SUSY
0.5000 mL | PREFILLED_SYRINGE | Freq: Once | INTRAMUSCULAR | Status: AC
  Administered 2024-06-16: 0.5 mL via INTRAMUSCULAR
  Filled 2024-06-16: qty 0.5

## 2024-06-16 MED ORDER — DENOSUMAB 60 MG/ML ~~LOC~~ SOSY
60.0000 mg | PREFILLED_SYRINGE | Freq: Once | SUBCUTANEOUS | Status: AC
  Administered 2024-06-16: 60 mg via SUBCUTANEOUS
  Filled 2024-06-16: qty 1

## 2024-06-16 NOTE — Progress Notes (Signed)
 Labs reviewed. Patient tolerated Prolia  and Flu injection with no complaints voiced.  Site clean and dry with no bruising or swelling noted at site.  See MAR for details.  Band aid applied.  Patient stable during and after injection.  Vss with discharge and left in satisfactory condition with no s/s of distress noted. All follow ups as scheduled.   Dominique Calvey

## 2024-06-30 DIAGNOSIS — Z0184 Encounter for antibody response examination: Secondary | ICD-10-CM | POA: Diagnosis not present

## 2024-06-30 DIAGNOSIS — M810 Age-related osteoporosis without current pathological fracture: Secondary | ICD-10-CM | POA: Diagnosis not present

## 2024-06-30 DIAGNOSIS — G629 Polyneuropathy, unspecified: Secondary | ICD-10-CM | POA: Diagnosis not present

## 2024-06-30 DIAGNOSIS — E538 Deficiency of other specified B group vitamins: Secondary | ICD-10-CM | POA: Diagnosis not present

## 2024-07-05 ENCOUNTER — Other Ambulatory Visit (HOSPITAL_COMMUNITY): Payer: Self-pay | Admitting: Family Medicine

## 2024-07-05 DIAGNOSIS — M810 Age-related osteoporosis without current pathological fracture: Secondary | ICD-10-CM

## 2024-07-26 ENCOUNTER — Encounter: Payer: Self-pay | Admitting: *Deleted

## 2024-09-14 ENCOUNTER — Ambulatory Visit: Admitting: Neurology

## 2024-11-29 ENCOUNTER — Other Ambulatory Visit

## 2024-12-06 ENCOUNTER — Ambulatory Visit: Admitting: Physician Assistant

## 2024-12-06 ENCOUNTER — Ambulatory Visit: Admitting: Hematology

## 2024-12-06 ENCOUNTER — Ambulatory Visit

## 2024-12-07 ENCOUNTER — Ambulatory Visit (INDEPENDENT_AMBULATORY_CARE_PROVIDER_SITE_OTHER): Admitting: Otolaryngology

## 2024-12-15 ENCOUNTER — Inpatient Hospital Stay

## 2024-12-22 ENCOUNTER — Inpatient Hospital Stay

## 2024-12-22 ENCOUNTER — Inpatient Hospital Stay: Admitting: Physician Assistant

## 2025-02-28 ENCOUNTER — Other Ambulatory Visit (HOSPITAL_COMMUNITY)
